# Patient Record
Sex: Female | Born: 1965 | Race: White | Hispanic: No | Marital: Married | Smoking: Never smoker
Health system: Southern US, Community
[De-identification: ages and names within clinical notes are randomized; demographics above are authoritative.]

## PROBLEM LIST (undated history)

## (undated) DIAGNOSIS — I10 Essential (primary) hypertension: Secondary | ICD-10-CM

## (undated) DIAGNOSIS — K760 Fatty (change of) liver, not elsewhere classified: Secondary | ICD-10-CM

## (undated) DIAGNOSIS — K8020 Calculus of gallbladder without cholecystitis without obstruction: Secondary | ICD-10-CM

## (undated) DIAGNOSIS — E7800 Pure hypercholesterolemia, unspecified: Secondary | ICD-10-CM

## (undated) DIAGNOSIS — E669 Obesity, unspecified: Secondary | ICD-10-CM

## (undated) DIAGNOSIS — I639 Cerebral infarction, unspecified: Secondary | ICD-10-CM

## (undated) DIAGNOSIS — N951 Menopausal and female climacteric states: Secondary | ICD-10-CM

## (undated) DIAGNOSIS — R232 Flushing: Secondary | ICD-10-CM

## (undated) DIAGNOSIS — E119 Type 2 diabetes mellitus without complications: Secondary | ICD-10-CM

## (undated) DIAGNOSIS — S42309A Unspecified fracture of shaft of humerus, unspecified arm, initial encounter for closed fracture: Secondary | ICD-10-CM

## (undated) DIAGNOSIS — M549 Dorsalgia, unspecified: Secondary | ICD-10-CM

## (undated) HISTORY — DX: Pure hypercholesterolemia, unspecified: E78.00

## (undated) HISTORY — DX: Fatty (change of) liver, not elsewhere classified: K76.0

## (undated) HISTORY — DX: Type 2 diabetes mellitus without complications: E11.9

## (undated) HISTORY — DX: Obesity, unspecified: E66.9

## (undated) HISTORY — DX: Flushing: R23.2

## (undated) HISTORY — DX: Cerebral infarction, unspecified: I63.9

## (undated) HISTORY — DX: Calculus of gallbladder without cholecystitis without obstruction: K80.20

## (undated) HISTORY — PX: TONSILLECTOMY AND ADENOIDECTOMY: SHX28

## (undated) HISTORY — DX: Menopausal and female climacteric states: N95.1

## (undated) HISTORY — PX: TUBAL LIGATION: SHX77

---

## 2004-07-14 ENCOUNTER — Other Ambulatory Visit: Admission: RE | Admit: 2004-07-14 | Discharge: 2004-07-14 | Payer: Self-pay | Admitting: Obstetrics and Gynecology

## 2007-07-24 ENCOUNTER — Other Ambulatory Visit: Admission: RE | Admit: 2007-07-24 | Discharge: 2007-07-24 | Payer: Self-pay | Admitting: Obstetrics and Gynecology

## 2007-12-13 ENCOUNTER — Emergency Department (HOSPITAL_COMMUNITY): Admission: EM | Admit: 2007-12-13 | Discharge: 2007-12-13 | Payer: Self-pay | Admitting: Emergency Medicine

## 2007-12-14 ENCOUNTER — Ambulatory Visit (HOSPITAL_COMMUNITY): Admission: RE | Admit: 2007-12-14 | Discharge: 2007-12-14 | Payer: Self-pay | Admitting: Emergency Medicine

## 2007-12-15 ENCOUNTER — Encounter (HOSPITAL_COMMUNITY): Admission: RE | Admit: 2007-12-15 | Discharge: 2008-01-14 | Payer: Self-pay | Admitting: Emergency Medicine

## 2010-08-17 ENCOUNTER — Other Ambulatory Visit: Payer: Self-pay | Admitting: Obstetrics & Gynecology

## 2010-08-17 ENCOUNTER — Other Ambulatory Visit (HOSPITAL_COMMUNITY)
Admission: RE | Admit: 2010-08-17 | Discharge: 2010-08-17 | Disposition: A | Discharge status: Home / Self Care | Payer: BC Managed Care – PPO | Source: Ambulatory Visit | Attending: Obstetrics and Gynecology | Admitting: Obstetrics and Gynecology

## 2010-08-17 DIAGNOSIS — Z01419 Encounter for gynecological examination (general) (routine) without abnormal findings: Secondary | ICD-10-CM | POA: Insufficient documentation

## 2010-08-17 DIAGNOSIS — Z139 Encounter for screening, unspecified: Secondary | ICD-10-CM

## 2010-08-21 ENCOUNTER — Ambulatory Visit (HOSPITAL_COMMUNITY)
Admission: RE | Admit: 2010-08-21 | Discharge: 2010-08-21 | Disposition: A | Discharge status: Home / Self Care | Payer: BC Managed Care – PPO | Source: Ambulatory Visit | Attending: Obstetrics & Gynecology | Admitting: Obstetrics & Gynecology

## 2010-08-21 DIAGNOSIS — Z139 Encounter for screening, unspecified: Secondary | ICD-10-CM

## 2010-08-21 DIAGNOSIS — Z1231 Encounter for screening mammogram for malignant neoplasm of breast: Secondary | ICD-10-CM | POA: Insufficient documentation

## 2010-08-25 ENCOUNTER — Other Ambulatory Visit: Payer: Self-pay | Admitting: Obstetrics & Gynecology

## 2010-08-25 DIAGNOSIS — R928 Other abnormal and inconclusive findings on diagnostic imaging of breast: Secondary | ICD-10-CM

## 2010-09-02 ENCOUNTER — Ambulatory Visit (HOSPITAL_COMMUNITY)
Admission: RE | Admit: 2010-09-02 | Discharge: 2010-09-02 | Disposition: A | Discharge status: Home / Self Care | Payer: BC Managed Care – PPO | Source: Ambulatory Visit | Attending: Obstetrics & Gynecology | Admitting: Obstetrics & Gynecology

## 2010-09-02 ENCOUNTER — Other Ambulatory Visit: Payer: Self-pay | Admitting: Obstetrics & Gynecology

## 2010-09-02 DIAGNOSIS — N63 Unspecified lump in unspecified breast: Secondary | ICD-10-CM | POA: Insufficient documentation

## 2010-09-02 DIAGNOSIS — R928 Other abnormal and inconclusive findings on diagnostic imaging of breast: Secondary | ICD-10-CM

## 2011-02-26 LAB — COMPREHENSIVE METABOLIC PANEL
AST: 22
Albumin: 3.8
BUN: 7
Calcium: 9.9
Chloride: 104
Creatinine, Ser: 0.81
GFR calc Af Amer: >60
Total Bilirubin: 0.6
Total Protein: 6.9

## 2011-02-26 LAB — URINALYSIS, ROUTINE W REFLEX MICROSCOPIC
Hgb urine dipstick: NEGATIVE
Ketones, ur: NEGATIVE
Protein, ur: NEGATIVE
Urobilinogen, UA: 0.2

## 2011-02-26 LAB — CBC
HCT: 38
MCV: 89.1
Platelets: 284
RDW: 13.2
WBC: 11.9 — ABNORMAL HIGH

## 2011-02-26 LAB — DIFFERENTIAL
Basophils Absolute: 0
Eosinophils Relative: 1
Lymphocytes Relative: 13
Lymphs Abs: 1.6
Monocytes Absolute: 0.6
Monocytes Relative: 5
Neutro Abs: 9.6 — ABNORMAL HIGH

## 2012-10-20 ENCOUNTER — Emergency Department (HOSPITAL_COMMUNITY)
Admission: EM | Admit: 2012-10-20 | Discharge: 2012-10-20 | Disposition: A | Discharge status: Home / Self Care | Payer: BC Managed Care – PPO | Attending: Emergency Medicine | Admitting: Emergency Medicine

## 2012-10-20 ENCOUNTER — Encounter (HOSPITAL_COMMUNITY): Payer: Self-pay

## 2012-10-20 DIAGNOSIS — Z79899 Other long term (current) drug therapy: Secondary | ICD-10-CM | POA: Insufficient documentation

## 2012-10-20 DIAGNOSIS — M5430 Sciatica, unspecified side: Secondary | ICD-10-CM | POA: Insufficient documentation

## 2012-10-20 DIAGNOSIS — IMO0001 Reserved for inherently not codable concepts without codable children: Secondary | ICD-10-CM | POA: Insufficient documentation

## 2012-10-20 DIAGNOSIS — M5432 Sciatica, left side: Secondary | ICD-10-CM

## 2012-10-20 DIAGNOSIS — I10 Essential (primary) hypertension: Secondary | ICD-10-CM | POA: Insufficient documentation

## 2012-10-20 HISTORY — DX: Dorsalgia, unspecified: M54.9

## 2012-10-20 HISTORY — DX: Essential (primary) hypertension: I10

## 2012-10-20 MED ORDER — METHYLPREDNISOLONE SODIUM SUCC 125 MG IJ SOLR
125.0000 mg | Freq: Once | INTRAMUSCULAR | Status: AC
  Administered 2012-10-20: 125 mg via INTRAMUSCULAR
  Filled 2012-10-20: qty 2

## 2012-10-20 MED ORDER — OXYCODONE-ACETAMINOPHEN 5-325 MG PO TABS: 1.0000 | ORAL_TABLET | ORAL | Status: DC | PRN

## 2012-10-20 MED ORDER — OXYCODONE-ACETAMINOPHEN 5-325 MG PO TABS
1.0000 | ORAL_TABLET | Freq: Once | ORAL | Status: AC
  Administered 2012-10-20: 1 via ORAL
  Filled 2012-10-20: qty 1

## 2012-10-20 MED ORDER — ONDANSETRON 4 MG PO TBDP: 4.0000 mg | ORAL_TABLET | Freq: Three times a day (TID) | ORAL | Status: DC | PRN

## 2012-10-20 MED ORDER — ONDANSETRON 4 MG PO TBDP: 4.0000 mg | ORAL_TABLET | Freq: Once | ORAL | Status: DC

## 2012-10-20 MED ORDER — CYCLOBENZAPRINE HCL 10 MG PO TABS
10.0000 mg | ORAL_TABLET | Freq: Once | ORAL | Status: AC
  Administered 2012-10-20: 10 mg via ORAL
  Filled 2012-10-20: qty 1

## 2012-10-20 MED ORDER — CYCLOBENZAPRINE HCL 10 MG PO TABS: 10.0000 mg | ORAL_TABLET | Freq: Two times a day (BID) | ORAL | Status: DC | PRN

## 2012-10-20 NOTE — ED Provider Notes (Signed)
History     CSN: [id number]  Arrival date & time 10/20/12  0137   First MD Initiated Contact with Patient 10/20/12 0205      Chief Complaint  Patient presents with  . Back Pain    (Consider location/radiation/quality/duration/timing/severity/associated sxs/prior treatment) Patient is a 47 y.o. female presenting with back pain.  Back Pain Associated symptoms: no abdominal pain, no chest pain, no fever, no headaches and no numbness    HPI Comments: Shelley Frazier is a 47 y.o. female who presents to the Emergency Department complaining of left buttock and back pain from her sciatic nerve. She is not aware of how she hurt it but it is similr to pain she has had in the past when the nerve was injured. Pain shoots up to her lower back and down to her heel. It hurts when she walks. Denies fever, chills, weakness.  PCP Dr. Margo Aye  Past Medical History  Diagnosis Date  . Hypertension   . Back pain     Past Surgical History  Procedure Laterality Date  . Tubal ligation      No family history on file.  History  Substance Use Topics  . Smoking status: Never Smoker   . Smokeless tobacco: Not on file  . Alcohol Use: No    OB History   Grav Para Term Preterm Abortions TAB SAB Ect Mult Living                  Review of Systems  Constitutional: Negative for fever.       10 Systems reviewed and are negative for acute change except as noted in the HPI.  HENT: Negative for congestion.   Eyes: Negative for discharge and redness.  Respiratory: Negative for cough and shortness of breath.   Cardiovascular: Negative for chest pain.  Gastrointestinal: Negative for vomiting and abdominal pain.  Musculoskeletal: Negative for back pain.       Left lower back pain  Skin: Negative for rash.  Neurological: Negative for syncope, numbness and headaches.  Psychiatric/Behavioral:       No behavior change.    Allergies  Vicodin  Home Medications   Current Outpatient Rx  Name  Route   Sig  Dispense  Refill  . hydrochlorothiazide (HYDRODIURIL) 25 MG tablet   Oral   Take 25 mg by mouth daily.         Marland Kitchen ibuprofen (ADVIL,MOTRIN) 200 MG tablet   Oral   Take 200 mg by mouth every 6 (six) hours as needed for pain.         . potassium chloride (KLOR-CON) 20 MEQ packet   Oral   Take 20 mEq by mouth 2 (two) times daily.           BP 140/66  Pulse 88  Temp(Src) 98.2 F (36.8 C) (Oral)  Resp 20  Ht 5\' 3"  (1.6 m)  Wt 206 lb (93.441 kg)  BMI 36.5 kg/m2  SpO2 100%  Physical Exam  Nursing note and vitals reviewed. Constitutional: She appears well-developed and well-nourished.  Awake, alert, nontoxic appearance.  HENT:  Head: Normocephalic and atraumatic.  Eyes: EOM are normal. Pupils are equal, round, and reactive to light.  Neck: Normal range of motion. Neck supple.  Cardiovascular: Normal rate and intact distal pulses.   Pulmonary/Chest: Effort normal and breath sounds normal. She exhibits no tenderness.  Abdominal: Soft. Bowel sounds are normal. There is no tenderness. There is no rebound.  Musculoskeletal: She exhibits no tenderness.  Baseline ROM, no obvious new focal weakness.straight leg positive on left. No spasms to the back.  Neurological:  Mental status and motor strength appears baseline for patient and situation.  Skin: No rash noted.  Psychiatric: She has a normal mood and affect.    ED Course  Procedures (including critical care time)     MDM  Patient presents with sciatica on the left. Given solumedrol, oxycodone, flexeril and zofran. Pt stable in ED with no significant deterioration in condition.The patient appears reasonably screened and/or stabilized for discharge and I doubt any other medical condition or other [facility] requiring further screening, evaluation, or treatment in the ED at this time prior to discharge.  MDM Reviewed: nursing note and vitals           Nicoletta Dress. Colon Branch, MD 10/20/12 [phone]

## 2013-07-18 ENCOUNTER — Other Ambulatory Visit: Payer: Self-pay | Admitting: Adult Health

## 2013-07-30 ENCOUNTER — Other Ambulatory Visit: Payer: Self-pay | Admitting: Adult Health

## 2013-07-30 ENCOUNTER — Ambulatory Visit (INDEPENDENT_AMBULATORY_CARE_PROVIDER_SITE_OTHER): Payer: BC Managed Care – PPO | Admitting: Adult Health

## 2013-07-30 ENCOUNTER — Encounter: Payer: Self-pay | Admitting: Adult Health

## 2013-07-30 ENCOUNTER — Encounter (INDEPENDENT_AMBULATORY_CARE_PROVIDER_SITE_OTHER): Payer: Self-pay

## 2013-07-30 ENCOUNTER — Other Ambulatory Visit (HOSPITAL_COMMUNITY)
Admission: RE | Admit: 2013-07-30 | Discharge: 2013-07-30 | Disposition: A | Discharge status: Home / Self Care | Payer: BC Managed Care – PPO | Source: Ambulatory Visit | Attending: Adult Health | Admitting: Adult Health

## 2013-07-30 VITALS — BP 166/98 | HR 78 | Ht 62.0 in | Wt 198.5 lb

## 2013-07-30 DIAGNOSIS — Z01419 Encounter for gynecological examination (general) (routine) without abnormal findings: Secondary | ICD-10-CM

## 2013-07-30 DIAGNOSIS — Z1212 Encounter for screening for malignant neoplasm of rectum: Secondary | ICD-10-CM

## 2013-07-30 DIAGNOSIS — I10 Essential (primary) hypertension: Secondary | ICD-10-CM

## 2013-07-30 DIAGNOSIS — Z1151 Encounter for screening for human papillomavirus (HPV): Secondary | ICD-10-CM | POA: Insufficient documentation

## 2013-07-30 DIAGNOSIS — N951 Menopausal and female climacteric states: Secondary | ICD-10-CM

## 2013-07-30 DIAGNOSIS — R232 Flushing: Secondary | ICD-10-CM

## 2013-07-30 HISTORY — DX: Menopausal and female climacteric states: N95.1

## 2013-07-30 HISTORY — DX: Flushing: R23.2

## 2013-07-30 LAB — HEMOCCULT GUIAC POC 1CARD (OFFICE): FECAL OCCULT BLD: NEGATIVE

## 2013-07-30 MED ORDER — LOSARTAN POTASSIUM 100 MG PO TABS: 100.0000 mg | ORAL_TABLET | Freq: Every day | ORAL | Status: DC

## 2013-07-30 MED ORDER — AMLODIPINE BESYLATE 5 MG PO TABS: 5.0000 mg | ORAL_TABLET | Freq: Every day | ORAL | Status: DC

## 2013-07-30 NOTE — Progress Notes (Signed)
Patient ID: Shelley Frazier, female   DOB: 20-Aug-1965, 48 y.o.   MRN: [id number] History of Present Illness: Shelley Frazier is a 48 year old white female, married in for a pap and physical.  Current Medications, Allergies, Past Medical History, Past Surgical History, Family History and Social History were reviewed in [employer] record.     Review of Systems: Patient denies any headaches, blurred vision, shortness of breath, chest pain, abdominal pain, problems with bowel movements, urination, or intercourse. No joint pain or mood swings.She does complain of hor flashes and LMP 03/2013.    Physical Exam:BP 166/98  Pulse 78  Ht 5\' 2"  (1.575 m)  Wt 198 lb 8 oz (90.039 kg)  BMI 36.30 kg/m2  LMP 04/18/2013 General:  Well developed, well nourished, no acute distress Skin:  Warm and dry Neck:  Midline trachea, normal thyroid Lungs; Clear to auscultation bilaterally Breast:  No dominant palpable mass, retraction, or nipple discharge Cardiovascular: Regular rate and rhythm Abdomen:  Soft, non tender, no hepatosplenomegaly Pelvic:  External genitalia is normal in appearance, has some vitiligo.  The vagina is normal in appearance.  The cervix is bulbous.Pap with HPV performed.  Uterus is felt to be normal size, shape, and contour.  No  adnexal masses or tenderness noted. Rectal: Good sphincter tone, no polyps, or hemorrhoids felt.  Hemoccult negative. Extremities:  No swelling or varicosities noted Psych:  No mood changes, alert and cooperative,seems happy Discussed menopause, will talk after labs back.  Impression: Yearly gyn exam Hot flashes Hypertension Perimenopausal     Plan: Check CBC,CMP,TSH and lipids and FSH Physical in 1 year Mammogram now and yearly Colonoscopy at 21 Will talk this week about labs Refilled norvasc 5 mg #30 1 daily x 1 year Refilled cozaar 100 mg #30 1 daily x 1 year Review handouts on menopause and hypertension

## 2013-07-30 NOTE — Patient Instructions (Signed)
Menopause Menopause is the normal time of life when menstrual periods stop completely. Menopause is complete when you have missed 12 consecutive menstrual periods. It usually occurs between the ages of 8 years and 48 years. Very rarely does a woman develop menopause before the age of 60 years. At menopause, your ovaries stop producing the female hormones estrogen and progesterone. This can cause undesirable symptoms and also affect your health. Sometimes the symptoms may occur 4 5 years before the menopause begins. There is no relationship between menopause and:  Oral contraceptives.  Number of children you had.  Race.  The age your menstrual periods started (menarche). Heavy smokers and very thin women may develop menopause earlier in life. CAUSES  The ovaries stop producing the female hormones estrogen and progesterone.  Other causes include:  Surgery to remove both ovaries.  The ovaries stop functioning for no known reason.  Tumors of the pituitary gland in the brain.  Medical disease that affects the ovaries and hormone production.  Radiation treatment to the abdomen or pelvis.  Chemotherapy that affects the ovaries. SYMPTOMS   Hot flashes.  Night sweats.  Decrease in sex drive.  Vaginal dryness and thinning of the vagina causing painful intercourse.  Dryness of the skin and developing wrinkles.  Headaches.  Tiredness.  Irritability.  Memory problems.  Weight gain.  Bladder infections.  Hair growth of the face and chest.  Infertility. More serious symptoms include:  Loss of bone (osteoporosis) causing breaks (fractures).  Depression.  Hardening and narrowing of the arteries (atherosclerosis) causing heart attacks and strokes. DIAGNOSIS   When the menstrual periods have stopped for 12 straight months.  Physical exam.  Hormone studies of the blood. TREATMENT  There are many treatment choices and nearly as many questions about them. The  decisions to treat or not to treat menopausal changes is an individual choice made with your health care provider. Your health care provider can discuss the treatments with you. Together, you can decide which treatment will work best for you. Your treatment choices may include:   Hormone therapy (estrogen and progesterone).  Non-hormonal medicines.  Treating the individual symptoms with medicine (for example antidepressants for depression).  Herbal medicines that may help specific symptoms.  Counseling by a psychiatrist or psychologist.  Group therapy.  Lifestyle changes including:  Eating healthy.  Regular exercise.  Limiting caffeine and alcohol.  Stress management and meditation.  No treatment. HOME CARE INSTRUCTIONS   Take the medicine your health care provider gives you as directed.  Get plenty of sleep and rest.  Exercise regularly.  Eat a diet that contains calcium (good for the bones) and soy products (acts like estrogen hormone).  Avoid alcoholic beverages.  Do not smoke.  If you have hot flashes, dress in layers.  Take supplements, calcium, and vitamin D to strengthen bones.  You can use over-the-counter lubricants or moisturizers for vaginal dryness.  Group therapy is sometimes very helpful.  Acupuncture may be helpful in some cases. SEEK MEDICAL CARE IF:   You are not sure you are in menopause.  You are having menopausal symptoms and need advice and treatment.  You are still having menstrual periods after age 71 years.  You have pain with intercourse.  Menopause is complete (no menstrual period for 12 months) and you develop vaginal bleeding.  You need a referral to a specialist (gynecologist, psychiatrist, or psychologist) for treatment. SEEK IMMEDIATE MEDICAL CARE IF:   You have severe depression.  You have excessive vaginal bleeding.  You fell and think you have a broken bone.  You have pain when you urinate.  You develop leg or  chest pain.  You have a fast pounding heart beat (palpitations).  You have severe headaches.  You develop vision problems.  You feel a lump in your breast.  You have abdominal pain or severe indigestion. Document Released: 08/07/2003 Document Revised: 01/17/2013 Document Reviewed: 12/14/2012 [facility] Patient Information 2014 [place], [place]. Hypertension As your heart beats, it forces blood through your arteries. This force is your blood pressure. If the pressure is too high, it is called hypertension (HTN) or high blood pressure. HTN is dangerous because you may have it and not know it. High blood pressure may mean that your heart has to work harder to pump blood. Your arteries may be narrow or stiff. The extra work puts you at risk for heart disease, stroke, and other problems.  Blood pressure consists of two numbers, a higher number over a lower, 110/72, for example. It is stated as "110 over 72." The ideal is below 120 for the top number (systolic) and under 80 for the bottom (diastolic). Write down your blood pressure today. You should pay close attention to your blood pressure if you have certain conditions such as:  Heart failure.  Prior heart attack.  Diabetes  Chronic kidney disease.  Prior stroke.  Multiple risk factors for heart disease. To see if you have HTN, your blood pressure should be measured while you are seated with your arm held at the level of the heart. It should be measured at least twice. A one-time elevated blood pressure reading (especially in the Emergency Department) does not mean that you need treatment. There may be conditions in which the blood pressure is different between your right and left arms. It is important to see your caregiver soon for a recheck. Most people have essential hypertension which means that there is not a specific cause. This type of high blood pressure may be lowered by changing lifestyle factors such  as:  Stress.  Smoking.  Lack of exercise.  Excessive weight.  Drug/tobacco/alcohol use.  Eating less salt. Most people do not have symptoms from high blood pressure until it has caused damage to the body. Effective treatment can often prevent, delay or reduce that damage. TREATMENT  When a cause has been identified, treatment for high blood pressure is directed at the cause. There are a large number of medications to treat HTN. These fall into several categories, and your caregiver will help you select the medicines that are best for you. Medications may have side effects. You should review side effects with your caregiver. If your blood pressure stays high after you have made lifestyle changes or started on medicines,   Your medication(s) may need to be changed.  Other problems may need to be addressed.  Be certain you understand your prescriptions, and know how and when to take your medicine.  Be sure to follow up with your caregiver within the time frame advised (usually within two weeks) to have your blood pressure rechecked and to review your medications.  If you are taking more than one medicine to lower your blood pressure, make sure you know how and at what times they should be taken. Taking two medicines at the same time can result in blood pressure that is too low. SEEK IMMEDIATE MEDICAL CARE IF:  You develop a severe headache, blurred or changing vision, or confusion.  You have unusual weakness or numbness, or  a faint feeling.  You have severe chest or abdominal pain, vomiting, or breathing problems. MAKE SURE YOU:   Understand these instructions.  Will watch your condition.  Will get help right away if you are not doing well or get worse. Document Released: 05/17/2005 Document Revised: 08/09/2011 Document Reviewed: 01/05/2008 [facility] Patient Information 2014 [place]. Get mammogram  Physical in 1 year Colonoscopy at 36  Will call with labs

## 2013-07-31 LAB — COMPREHENSIVE METABOLIC PANEL
ALBUMIN: 4.1 g/dL (ref 3.5–5.2)
ALK PHOS: 111 U/L (ref 39–117)
ALT: 46 U/L — ABNORMAL HIGH (ref 0–35)
AST: 22 U/L (ref 0–37)
BUN: 8 mg/dL (ref 6–23)
CO2: 29 mEq/L (ref 19–32)
Calcium: 9.9 mg/dL (ref 8.4–10.5)
Chloride: 98 mEq/L (ref 96–112)
Creat: 0.69 mg/dL (ref 0.50–1.10)
GLUCOSE: 305 mg/dL — AB (ref 70–99)
POTASSIUM: 4.1 meq/L (ref 3.5–5.3)
Sodium: 137 mEq/L (ref 135–145)
Total Bilirubin: 0.6 mg/dL (ref 0.2–1.2)
Total Protein: 7.2 g/dL (ref 6.0–8.3)

## 2013-07-31 LAB — CBC
HEMATOCRIT: 44.8 % (ref 36.0–46.0)
HEMOGLOBIN: 15.2 g/dL — AB (ref 12.0–15.0)
MCH: 29.5 pg (ref 26.0–34.0)
MCHC: 33.9 g/dL (ref 30.0–36.0)
MCV: 87 fL (ref 78.0–100.0)
Platelets: 267 10*3/uL (ref 150–400)
RBC: 5.15 MIL/uL — ABNORMAL HIGH (ref 3.87–5.11)
RDW: 13.9 % (ref 11.5–15.5)
WBC: 8.2 10*3/uL (ref 4.0–10.5)

## 2013-07-31 LAB — LIPID PANEL
CHOLESTEROL: 275 mg/dL — AB (ref 0–200)
HDL: 39 mg/dL — ABNORMAL LOW (ref >39)
LDL Cholesterol: 191 mg/dL — ABNORMAL HIGH (ref 0–99)
TRIGLYCERIDES: 224 mg/dL — AB (ref <150)
Total CHOL/HDL Ratio: 7.1 Ratio
VLDL: 45 mg/dL — AB (ref 0–40)

## 2013-07-31 LAB — FOLLICLE STIMULATING HORMONE: FSH: 74 m[IU]/mL

## 2013-07-31 LAB — HEMOGLOBIN A1C
Hgb A1c MFr Bld: 12.8 % — ABNORMAL HIGH (ref <5.7)
MEAN PLASMA GLUCOSE: 321 mg/dL — AB (ref <117)

## 2013-07-31 LAB — TSH: TSH: 0.665 u[IU]/mL (ref 0.350–4.500)

## 2013-08-02 ENCOUNTER — Telehealth: Payer: Self-pay | Admitting: Adult Health

## 2013-08-02 NOTE — Telephone Encounter (Signed)
Pt aware of labs and that she has diabetes and hyperdyslipidemea and needs meds and counseling will call Dr Janine Ores he may want to handle all this, also pap -HPV, has + yeast, no syn mptoms declines meds for that at present.

## 2014-04-01 ENCOUNTER — Encounter: Payer: Self-pay | Admitting: Adult Health

## 2015-10-08 ENCOUNTER — Other Ambulatory Visit (HOSPITAL_COMMUNITY): Payer: Self-pay | Admitting: Internal Medicine

## 2015-10-08 DIAGNOSIS — R945 Abnormal results of liver function studies: Secondary | ICD-10-CM

## 2015-10-30 ENCOUNTER — Other Ambulatory Visit (HOSPITAL_COMMUNITY): Payer: Self-pay | Admitting: Internal Medicine

## 2015-10-30 DIAGNOSIS — Z1231 Encounter for screening mammogram for malignant neoplasm of breast: Secondary | ICD-10-CM

## 2015-11-04 ENCOUNTER — Ambulatory Visit (HOSPITAL_COMMUNITY)
Admission: RE | Admit: 2015-11-04 | Discharge: 2015-11-04 | Disposition: A | Discharge status: Home / Self Care | Payer: BC Managed Care – PPO | Source: Ambulatory Visit | Attending: Internal Medicine | Admitting: Internal Medicine

## 2015-11-04 DIAGNOSIS — K8020 Calculus of gallbladder without cholecystitis without obstruction: Secondary | ICD-10-CM | POA: Insufficient documentation

## 2015-11-04 DIAGNOSIS — K760 Fatty (change of) liver, not elsewhere classified: Secondary | ICD-10-CM | POA: Diagnosis not present

## 2015-11-04 DIAGNOSIS — R945 Abnormal results of liver function studies: Secondary | ICD-10-CM

## 2015-11-04 DIAGNOSIS — K7689 Other specified diseases of liver: Secondary | ICD-10-CM | POA: Insufficient documentation

## 2015-11-17 ENCOUNTER — Ambulatory Visit (HOSPITAL_COMMUNITY)
Admission: RE | Admit: 2015-11-17 | Discharge: 2015-11-17 | Disposition: A | Discharge status: Home / Self Care | Payer: BC Managed Care – PPO | Source: Ambulatory Visit | Attending: Internal Medicine | Admitting: Internal Medicine

## 2015-11-17 DIAGNOSIS — Z1231 Encounter for screening mammogram for malignant neoplasm of breast: Secondary | ICD-10-CM | POA: Insufficient documentation

## 2016-05-31 DIAGNOSIS — I639 Cerebral infarction, unspecified: Secondary | ICD-10-CM

## 2016-05-31 HISTORY — DX: Cerebral infarction, unspecified: I63.9

## 2016-07-28 ENCOUNTER — Other Ambulatory Visit (HOSPITAL_COMMUNITY)
Admission: RE | Admit: 2016-07-28 | Discharge: 2016-07-28 | Disposition: A | Discharge status: Home / Self Care | Payer: BC Managed Care – PPO | Source: Ambulatory Visit | Attending: Adult Health | Admitting: Adult Health

## 2016-07-28 ENCOUNTER — Encounter: Payer: Self-pay | Admitting: Adult Health

## 2016-07-28 ENCOUNTER — Other Ambulatory Visit: Payer: Self-pay | Admitting: Obstetrics and Gynecology

## 2016-07-28 ENCOUNTER — Ambulatory Visit (INDEPENDENT_AMBULATORY_CARE_PROVIDER_SITE_OTHER): Payer: BC Managed Care – PPO | Admitting: Adult Health

## 2016-07-28 VITALS — BP 170/90 | HR 75 | Ht 62.0 in | Wt 179.5 lb

## 2016-07-28 DIAGNOSIS — Z1151 Encounter for screening for human papillomavirus (HPV): Secondary | ICD-10-CM | POA: Insufficient documentation

## 2016-07-28 DIAGNOSIS — Z01411 Encounter for gynecological examination (general) (routine) with abnormal findings: Secondary | ICD-10-CM

## 2016-07-28 DIAGNOSIS — Z01419 Encounter for gynecological examination (general) (routine) without abnormal findings: Secondary | ICD-10-CM | POA: Insufficient documentation

## 2016-07-28 DIAGNOSIS — N950 Postmenopausal bleeding: Secondary | ICD-10-CM | POA: Diagnosis not present

## 2016-07-28 DIAGNOSIS — Z1212 Encounter for screening for malignant neoplasm of rectum: Secondary | ICD-10-CM

## 2016-07-28 DIAGNOSIS — O3680X0 Pregnancy with inconclusive fetal viability, not applicable or unspecified: Secondary | ICD-10-CM

## 2016-07-28 DIAGNOSIS — Z1211 Encounter for screening for malignant neoplasm of colon: Secondary | ICD-10-CM

## 2016-07-28 LAB — HEMOCCULT GUIAC POC 1CARD (OFFICE): FECAL OCCULT BLD: NEGATIVE

## 2016-07-28 NOTE — Patient Instructions (Signed)
Physical in 1 year Pap in 3 years if this one normal Mammogram yearly Colonoscopy advised  Labs with PCP Postmenopausal Bleeding Postmenopausal bleeding is any bleeding a woman has after she has entered into menopause. Menopause is the end of a woman's fertile years. After menopause, a woman no longer ovulates or has menstrual periods. Postmenopausal bleeding can be caused by various things. Any type of postmenopausal bleeding, even if it appears to be a typical menstrual period, is concerning. This should be evaluated by your health care provider. Any treatment will depend on the cause of the bleeding. Follow these instructions at home: Monitor your condition for any changes. The following actions may help to alleviate any discomfort you are experiencing:  Avoid the use of tampons and douches as directed by your health care provider.  Change your pads frequently.  Get regular pelvic exams and Pap tests.  Keep all follow-up appointments for diagnostic tests as directed by your health care provider. Contact a health care provider if:  Your bleeding lasts more than 1 week.  You have abdominal pain.  You have bleeding with sexual intercourse. Get help right away if:  You have a fever, chills, headache, dizziness, muscle aches, and bleeding.  You have severe pain with bleeding.  You are passing blood clots.  You have bleeding and need more than 1 pad an hour.  You feel faint. This information is not intended to replace advice given to you by your health care provider. Make sure you discuss any questions you have with your health care provider. Document Released: 08/25/2005 Document Revised: 10/23/2015 Document Reviewed: 12/14/2012 Elsevier Interactive Patient Education  2017 [employer].

## 2016-07-28 NOTE — Progress Notes (Signed)
Patient ID: Shelley Frazier, female   DOB: 1965/09/26, 51 y.o.   MRN: [id number] History of Present Illness:  Shelley Frazier is a 51 year old white female, married, G2P2, PM in for well woman gyn exam and pap.She says she had vaginal bleeding about a month ago and last periods was about 3 years ago. PCP is Dr Maudie Mercury.  Current Medications, Allergies, Past Medical History, Past Surgical History, Family History and Social History were reviewed in [employer] record.     Review of Systems: Patient denies any headaches, hearing loss, fatigue, blurred vision, shortness of breath, chest pain, abdominal pain, problems with bowel movements, urination, or intercourse(hurts at times, using lubricate). No joint pain or mood swings. Had spotting last month, last period about 3 years ago   Physical Exam:BP (!) 170/90 (BP Location: Right Arm, Patient Position: Sitting, Cuff Size: Normal)   Pulse 75   Ht 5\' 2"  (1.575 m)   Wt 179 lb 8 oz (81.4 kg)   LMP 04/18/2013   BMI 32.83 kg/m  General:  Well developed, well nourished, no acute distress Skin:  Warm and dry Neck:  Midline trachea, normal thyroid, good ROM, no lymphadenopathy Lungs; Clear to auscultation bilaterally Breast:  No dominant palpable mass, retraction, or nipple discharge Cardiovascular: Regular rate and rhythm Abdomen:  Soft, non tender, no hepatosplenomegaly Pelvic:  External genitalia is normal in appearance, no lesions.  The vagina is normal in appearance. Urethra has no lesions or masses. The cervix is smooth, pap with HPV performed.Marland Kitchen  Uterus is felt to be normal size, shape, and contour.  No adnexal masses or tenderness noted.Bladder is non tender, no masses felt. Rectal: Good sphincter tone, no polyps, or hemorrhoids felt.  Hemoccult negative. Extremities/musculoskeletal:  No swelling or varicosities noted, no clubbing or cyanosis Psych:  No mood changes, alert and cooperative,seems happy PHQ 2 score 0.Will get [place] to  assess uterus, discussed PMB.  Impression:  1. Encounter for gynecological examination with Papanicolaou smear of cervix   2. Screening for colorectal cancer   3. PMB (postmenopausal bleeding)      Plan: Physical in 1 year Pap in 3 years if this one normal Mammogram yearly Colonoscopy advised  Labs with PCP F/U with Dr Maudie Mercury about BP, she thinks it is white coat is on BP meds

## 2016-07-29 LAB — CYTOLOGY - PAP
Adequacy: ABSENT
Diagnosis: NEGATIVE
HPV: NOT DETECTED

## 2016-08-04 ENCOUNTER — Other Ambulatory Visit: Payer: BC Managed Care – PPO

## 2016-08-09 ENCOUNTER — Other Ambulatory Visit: Payer: BC Managed Care – PPO

## 2016-08-16 ENCOUNTER — Other Ambulatory Visit: Payer: Self-pay | Admitting: Adult Health

## 2016-08-16 ENCOUNTER — Ambulatory Visit (INDEPENDENT_AMBULATORY_CARE_PROVIDER_SITE_OTHER): Payer: BC Managed Care – PPO

## 2016-08-16 DIAGNOSIS — N950 Postmenopausal bleeding: Secondary | ICD-10-CM

## 2016-08-16 DIAGNOSIS — O3680X0 Pregnancy with inconclusive fetal viability, not applicable or unspecified: Secondary | ICD-10-CM

## 2016-08-16 NOTE — Progress Notes (Signed)
PELVIC US TA/TV: heterogeneous anteverted uterus w/mult small fibroids (#1) post intramural fibroid 10 x 7 x 6 mm (#2) ant left intramural fibroid 9 x 8 x 4 mm,thickened endometrium 6.5 mm, 8 x 8 x 6 mm hypoechoic focal mass within the endometrial canal(w/color flow),normal ovaries bilat (limited view),no free fluid

## 2016-08-18 ENCOUNTER — Telehealth: Payer: Self-pay | Admitting: Adult Health

## 2016-08-18 NOTE — Telephone Encounter (Signed)
Pt aware US shows small fibroid, thickened endometrium and ?polyp, needs endo bx and possible, D&C, will make appt with Dr Elonda Husky

## 2016-08-30 ENCOUNTER — Encounter: Payer: Self-pay | Admitting: Obstetrics & Gynecology

## 2016-08-30 ENCOUNTER — Ambulatory Visit (INDEPENDENT_AMBULATORY_CARE_PROVIDER_SITE_OTHER): Payer: BC Managed Care – PPO | Admitting: Obstetrics & Gynecology

## 2016-08-30 VITALS — BP 152/96 | HR 85 | Wt 179.0 lb

## 2016-08-30 DIAGNOSIS — N950 Postmenopausal bleeding: Secondary | ICD-10-CM

## 2016-08-30 DIAGNOSIS — N840 Polyp of corpus uteri: Secondary | ICD-10-CM

## 2016-08-30 NOTE — Progress Notes (Signed)
Preoperative History and Physical  Shelley Frazier is a 51 y.o. [id number] with Patient's last menstrual period was 04/18/2013. admitted for a hysteroscopy uterine curettage with removal of endometrial polyp, mass.  Pt has a significant endoemtrial mass, polyp vs myoma, favor polyp as cause of her PMB.  Endometrial biopsy is not indicated since going forward with formal endoemtrial evaluation  PMH:    Past Medical History:  Diagnosis Date  . Back pain   . Diabetes mellitus without complication ([place])   . Fatty liver   . Gallstones   . Hot flashes 07/30/2013  . Hypertension   . Obesity   . Perimenopausal symptoms 07/30/2013    PSH:     Past Surgical History:  Procedure Laterality Date  . TONSILLECTOMY AND ADENOIDECTOMY    . TUBAL LIGATION      POb/GynH:      OB History    Gravida Para Term Preterm AB Living   2 2 1 1   2    SAB TAB Ectopic Multiple Live Births           2      SH:   Social History  Substance Use Topics  . Smoking status: Never Smoker  . Smokeless tobacco: Never Used  . Alcohol use No    FH:    Family History  Problem Relation Age of Onset  . Hypertension Mother   . Gallbladder disease Mother   . Diabetes Father   . Hypertension Father   . Gallbladder disease Maternal Aunt   . Heart disease Paternal Aunt   . Heart disease Paternal Uncle   . Diabetes Maternal Grandfather   . Heart disease Paternal Grandmother   . Diabetes Paternal Grandmother      Allergies:  Allergies  Allergen Reactions  . Vicodin [Hydrocodone-Acetaminophen] Nausea And Vomiting    Medications:       Current Outpatient Prescriptions:  .  COD LIVER OIL PO, Take by mouth daily., Disp: , Rfl:  .  glimepiride (AMARYL) 2 MG tablet, Take 2 mg by mouth daily with breakfast., Disp: , Rfl:  .  ibuprofen (ADVIL,MOTRIN) 200 MG tablet, Take 200 mg by mouth every 6 (six) hours as needed for pain., Disp: , Rfl:  .  lisinopril (PRINIVIL,ZESTRIL) 2.5 MG tablet, Take 2.5 mg by mouth daily.,  Disp: , Rfl:  .  lovastatin (MEVACOR) 40 MG tablet, Take 40 mg by mouth at bedtime., Disp: , Rfl:  .  metFORMIN (GLUCOPHAGE) 1000 MG tablet, Take 1,000 mg by mouth 2 (two) times daily with a meal., Disp: , Rfl:  .  Multiple Vitamin (MULTIVITAMIN) tablet, Take 1 tablet by mouth daily., Disp: , Rfl:   Review of Systems:   Review of Systems  Constitutional: Negative for fever, chills, weight loss, malaise/fatigue and diaphoresis.  HENT: Negative for hearing loss, ear pain, nosebleeds, congestion, sore throat, neck pain, tinnitus and ear discharge.   Eyes: Negative for blurred vision, double vision, photophobia, pain, discharge and redness.  Respiratory: Negative for cough, hemoptysis, sputum production, shortness of breath, wheezing and stridor.   Cardiovascular: Negative for chest pain, palpitations, orthopnea, claudication, leg swelling and PND.  Gastrointestinal: Positive for abdominal pain. Negative for heartburn, nausea, vomiting, diarrhea, constipation, blood in stool and melena.  Genitourinary: Negative for dysuria, urgency, frequency, hematuria and flank pain.  Musculoskeletal: Negative for myalgias, back pain, joint pain and falls.  Skin: Negative for itching and rash.  Neurological: Negative for dizziness, tingling, tremors, sensory change, speech change, focal weakness, seizures,  loss of consciousness, weakness and headaches.  Endo/Heme/Allergies: Negative for environmental allergies and polydipsia. Does not bruise/bleed easily.  Psychiatric/Behavioral: Negative for depression, suicidal ideas, hallucinations, memory loss and substance abuse. The patient is not nervous/anxious and does not have insomnia.      PHYSICAL EXAM:  Blood pressure (!) 152/96, pulse 85, weight 179 lb (81.2 kg), last menstrual period 04/18/2013.    Vitals reviewed. Constitutional: She is oriented to person, place, and time. She appears well-developed and well-nourished.  HENT:  Head: Normocephalic and  atraumatic.  Right Ear: External ear normal.  Left Ear: External ear normal.  Nose: Nose normal.  Mouth/Throat: Oropharynx is clear and moist.  Eyes: Conjunctivae and EOM are normal. Pupils are equal, round, and reactive to light. Right eye exhibits no discharge. Left eye exhibits no discharge. No scleral icterus.  Neck: Normal range of motion. Neck supple. No tracheal deviation present. No thyromegaly present.  Cardiovascular: Normal rate, regular rhythm, normal heart sounds and intact distal pulses.  Exam reveals no gallop and no friction rub.   No murmur heard. Respiratory: Effort normal and breath sounds normal. No respiratory distress. She has no wheezes. She has no rales. She exhibits no tenderness.  GI: Soft. Bowel sounds are normal. She exhibits no distension and no mass. There is tenderness. There is no rebound and no guarding.  Genitourinary:       Vulva is normal without lesions Vagina is pink moist without discharge Cervix normal in appearance and pap is normal Uterus is normal size, contour, position, consistency, mobility, non-tender Adnexa is negative with normal sized ovaries by sonogram  Musculoskeletal: Normal range of motion. She exhibits no edema and no tenderness.  Neurological: She is alert and oriented to person, place, and time. She has normal reflexes. She displays normal reflexes. No cranial nerve deficit. She exhibits normal muscle tone. Coordination normal.  Skin: Skin is warm and dry. No rash noted. No erythema. No pallor.  Psychiatric: She has a normal mood and affect. Her behavior is normal. Judgment and thought content normal.    Labs: No results found for this or any previous visit (from the past 336 hour(s)).  EKG: No orders found for this or any previous visit.  Imaging Studies: US Transvaginal Non-ob  Result Date: 08/17/2016 GYNECOLOGIC SONOGRAM Shelley Frazier is a 51 y.o. [id number] for a pelvic sonogram for postmenopausal bleeding. Uterus                       7.6 x 4 x 4.6 cm, heterogeneous anteverted uterus w/mult small fibroids Endometrium          6.5 mm, symmetrical, thickened EEC, 8 x 8 x 6 mm hypoechoic focal mass within the endometrial canal(w/color flow) Right ovary             2.2 x 1.3 x 2.2 cm, wnl, limited view Left ovary                2.3 x 1.7 x 2 cm, wnl ,  limited view Fibroids (#1) post intramural fibroid 10 x 7 x 6 mm (#2) ant left intramural fibroid 9 x 8 x 4 mm Technician Comments: PELVIC US TA/TV: heterogeneous anteverted uterus w/mult small fibroids (#1) post intramural fibroid 10 x 7 x 6 mm (#2) ant left intramural fibroid 9 x 8 x 4 mm,thickened endometrium 6.5 mm, 8 x 8 x 6 mm hypoechoic focal mass within the endometrial canal(w/color flow),normal ovaries bilat (limited view),no free fluid [employer]  Heide Guile 08/16/2016 5:15 PM Clinical Impression and recommendations: I have reviewed the sonogram results above, combined with the patient's current clinical course, below are my impressions and any appropriate recommendations for management based on the sonographic findings. Uterus with 2 very small myomas, clinically irrelevant Endometrium is thickened for a post menopausal woman and there is a endometrial mass, consistent with a polyp Both ovaries are normal Recommend endometrial sampling at a minimum but consider a formal hysteroscopy with removal of the endometrial polyp and a uterine curettage Jakeisha Stricker H 08/17/2016 6:50 PM   US Pelvis Complete  Result Date: 08/17/2016 GYNECOLOGIC SONOGRAM Shelley Frazier is a 51 y.o. [id number] for a pelvic sonogram for postmenopausal bleeding. Uterus                      7.6 x 4 x 4.6 cm, heterogeneous anteverted uterus w/mult small fibroids Endometrium          6.5 mm, symmetrical, thickened EEC, 8 x 8 x 6 mm hypoechoic focal mass within the endometrial canal(w/color flow) Right ovary             2.2 x 1.3 x 2.2 cm, wnl, limited view Left ovary                2.3 x 1.7 x 2 cm, wnl ,  limited view  Fibroids (#1) post intramural fibroid 10 x 7 x 6 mm (#2) ant left intramural fibroid 9 x 8 x 4 mm Technician Comments: PELVIC US TA/TV: heterogeneous anteverted uterus w/mult small fibroids (#1) post intramural fibroid 10 x 7 x 6 mm (#2) ant left intramural fibroid 9 x 8 x 4 mm,thickened endometrium 6.5 mm, 8 x 8 x 6 mm hypoechoic focal mass within the endometrial canal(w/color flow),normal ovaries bilat (limited view),no free fluid Amber Heide Guile 08/16/2016 5:15 PM Clinical Impression and recommendations: I have reviewed the sonogram results above, combined with the patient's current clinical course, below are my impressions and any appropriate recommendations for management based on the sonographic findings. Uterus with 2 very small myomas, clinically irrelevant Endometrium is thickened for a post menopausal woman and there is a endometrial mass, consistent with a polyp Both ovaries are normal Recommend endometrial sampling at a minimum but consider a formal hysteroscopy with removal of the endometrial polyp and a uterine curettage Kassandra Meriweather H 08/17/2016 6:50 PM      Assessment: Endometrial polyp Post menopausal bleeding  Patient Active Problem List   Diagnosis Date Noted  . Hot flashes 07/30/2013  . Hypertension 07/30/2013  . Perimenopausal symptoms 07/30/2013    Plan: Hysteroscopy uterine curettage removal of endometrial polyp 09/22/2016  Siris Hoos H 08/30/2016 3:30 PM

## 2016-09-15 ENCOUNTER — Inpatient Hospital Stay (HOSPITAL_COMMUNITY): Admission: RE | Admit: 2016-09-15 | Payer: BC Managed Care – PPO | Source: Ambulatory Visit

## 2016-09-22 ENCOUNTER — Ambulatory Visit: Admit: 2016-09-22 | Payer: BC Managed Care – PPO | Admitting: Obstetrics & Gynecology

## 2016-09-22 SURGERY — DILATATION & CURETTAGE/HYSTEROSCOPY WITH NOVASURE ABLATION
Anesthesia: General

## 2016-09-30 ENCOUNTER — Encounter: Payer: BC Managed Care – PPO | Admitting: Obstetrics & Gynecology

## 2016-10-01 ENCOUNTER — Inpatient Hospital Stay (HOSPITAL_COMMUNITY)
Admission: EM | Admit: 2016-10-01 | Discharge: 2016-10-04 | DRG: 065 | Disposition: A | Discharge status: Rehabilitation Facility | Payer: BC Managed Care – PPO | Attending: Internal Medicine | Admitting: Internal Medicine

## 2016-10-01 ENCOUNTER — Observation Stay (HOSPITAL_COMMUNITY): Payer: BC Managed Care – PPO

## 2016-10-01 ENCOUNTER — Emergency Department (HOSPITAL_COMMUNITY): Payer: BC Managed Care – PPO

## 2016-10-01 ENCOUNTER — Encounter (HOSPITAL_COMMUNITY): Payer: Self-pay | Admitting: *Deleted

## 2016-10-01 DIAGNOSIS — I158 Other secondary hypertension: Secondary | ICD-10-CM | POA: Diagnosis not present

## 2016-10-01 DIAGNOSIS — K760 Fatty (change of) liver, not elsewhere classified: Secondary | ICD-10-CM | POA: Diagnosis present

## 2016-10-01 DIAGNOSIS — I10 Essential (primary) hypertension: Secondary | ICD-10-CM | POA: Diagnosis present

## 2016-10-01 DIAGNOSIS — R4701 Aphasia: Secondary | ICD-10-CM | POA: Diagnosis present

## 2016-10-01 DIAGNOSIS — Z885 Allergy status to narcotic agent status: Secondary | ICD-10-CM

## 2016-10-01 DIAGNOSIS — Z791 Long term (current) use of non-steroidal anti-inflammatories (NSAID): Secondary | ICD-10-CM

## 2016-10-01 DIAGNOSIS — Z7984 Long term (current) use of oral hypoglycemic drugs: Secondary | ICD-10-CM

## 2016-10-01 DIAGNOSIS — E1149 Type 2 diabetes mellitus with other diabetic neurological complication: Secondary | ICD-10-CM | POA: Diagnosis present

## 2016-10-01 DIAGNOSIS — I639 Cerebral infarction, unspecified: Secondary | ICD-10-CM

## 2016-10-01 DIAGNOSIS — I5032 Chronic diastolic (congestive) heart failure: Secondary | ICD-10-CM

## 2016-10-01 DIAGNOSIS — I6350 Cerebral infarction due to unspecified occlusion or stenosis of unspecified cerebral artery: Secondary | ICD-10-CM | POA: Diagnosis present

## 2016-10-01 DIAGNOSIS — G8191 Hemiplegia, unspecified affecting right dominant side: Secondary | ICD-10-CM | POA: Diagnosis present

## 2016-10-01 DIAGNOSIS — Z79899 Other long term (current) drug therapy: Secondary | ICD-10-CM

## 2016-10-01 DIAGNOSIS — I6329 Cerebral infarction due to unspecified occlusion or stenosis of other precerebral arteries: Principal | ICD-10-CM | POA: Diagnosis present

## 2016-10-01 DIAGNOSIS — Z8249 Family history of ischemic heart disease and other diseases of the circulatory system: Secondary | ICD-10-CM

## 2016-10-01 DIAGNOSIS — Z833 Family history of diabetes mellitus: Secondary | ICD-10-CM

## 2016-10-01 DIAGNOSIS — E1165 Type 2 diabetes mellitus with hyperglycemia: Secondary | ICD-10-CM | POA: Diagnosis present

## 2016-10-01 DIAGNOSIS — R471 Dysarthria and anarthria: Secondary | ICD-10-CM | POA: Diagnosis present

## 2016-10-01 DIAGNOSIS — R269 Unspecified abnormalities of gait and mobility: Secondary | ICD-10-CM | POA: Diagnosis present

## 2016-10-01 DIAGNOSIS — R29810 Facial weakness: Secondary | ICD-10-CM | POA: Diagnosis not present

## 2016-10-01 DIAGNOSIS — Z6830 Body mass index (BMI) 30.0-30.9, adult: Secondary | ICD-10-CM

## 2016-10-01 DIAGNOSIS — E669 Obesity, unspecified: Secondary | ICD-10-CM | POA: Diagnosis present

## 2016-10-01 DIAGNOSIS — I651 Occlusion and stenosis of basilar artery: Secondary | ICD-10-CM

## 2016-10-01 DIAGNOSIS — E782 Mixed hyperlipidemia: Secondary | ICD-10-CM | POA: Diagnosis present

## 2016-10-01 DIAGNOSIS — R739 Hyperglycemia, unspecified: Secondary | ICD-10-CM

## 2016-10-01 DIAGNOSIS — I110 Hypertensive heart disease with heart failure: Secondary | ICD-10-CM | POA: Diagnosis present

## 2016-10-01 LAB — URINALYSIS, ROUTINE W REFLEX MICROSCOPIC
BACTERIA UA: NONE SEEN
Bilirubin Urine: NEGATIVE
Glucose, UA: >=500 mg/dL — AB
Hgb urine dipstick: NEGATIVE
KETONES UR: 20 mg/dL — AB
Leukocytes, UA: NEGATIVE
Nitrite: NEGATIVE
PROTEIN: NEGATIVE mg/dL
SQUAMOUS EPITHELIAL / LPF: NONE SEEN
Specific Gravity, Urine: 1.027 (ref 1.005–1.030)
pH: 5 (ref 5.0–8.0)

## 2016-10-01 LAB — CBC
HCT: 41.5 % (ref 36.0–46.0)
HEMATOCRIT: 44.9 % (ref 36.0–46.0)
HEMOGLOBIN: 16.1 g/dL — AB (ref 12.0–15.0)
HEMOGLOBIN: 15 g/dL (ref 12.0–15.0)
MCH: 31.4 pg (ref 26.0–34.0)
MCH: 31.4 pg (ref 26.0–34.0)
MCHC: 35.9 g/dL (ref 30.0–36.0)
MCHC: 36.1 g/dL — AB (ref 30.0–36.0)
MCV: 87.5 fL (ref 78.0–100.0)
MCV: 87 fL (ref 78.0–100.0)
PLATELETS: 267 10*3/uL (ref 150–400)
Platelets: 242 10*3/uL (ref 150–400)
RBC: 5.13 MIL/uL — AB (ref 3.87–5.11)
RBC: 4.77 MIL/uL (ref 3.87–5.11)
RDW: 12.2 % (ref 11.5–15.5)
RDW: 12.3 % (ref 11.5–15.5)
WBC: 8.7 10*3/uL (ref 4.0–10.5)
WBC: 9.1 10*3/uL (ref 4.0–10.5)

## 2016-10-01 LAB — COMPREHENSIVE METABOLIC PANEL
ALBUMIN: 3.8 g/dL (ref 3.5–5.0)
ALK PHOS: 79 U/L (ref 38–126)
ALT: 43 U/L (ref 14–54)
AST: 27 U/L (ref 15–41)
Anion gap: 11 (ref 5–15)
BILIRUBIN TOTAL: 1 mg/dL (ref 0.3–1.2)
BUN: 13 mg/dL (ref 6–20)
CALCIUM: 9.2 mg/dL (ref 8.9–10.3)
CO2: 23 mmol/L (ref 22–32)
Chloride: 98 mmol/L — ABNORMAL LOW (ref 101–111)
Creatinine, Ser: 0.69 mg/dL (ref 0.44–1.00)
GFR calc Af Amer: >60 mL/min (ref >60)
GLUCOSE: 390 mg/dL — AB (ref 65–99)
Potassium: 3.8 mmol/L (ref 3.5–5.1)
Sodium: 132 mmol/L — ABNORMAL LOW (ref 135–145)
TOTAL PROTEIN: 7.3 g/dL (ref 6.5–8.1)

## 2016-10-01 LAB — CREATININE, SERUM
CREATININE: 0.77 mg/dL (ref 0.44–1.00)
GFR calc Af Amer: >60 mL/min (ref >60)

## 2016-10-01 LAB — CBG MONITORING, ED
GLUCOSE-CAPILLARY: 319 mg/dL — AB (ref 65–99)
Glucose-Capillary: 404 mg/dL — ABNORMAL HIGH (ref 65–99)

## 2016-10-01 LAB — DIFFERENTIAL
BASOS ABS: 0 10*3/uL (ref 0.0–0.1)
Basophils Relative: 1 %
EOS PCT: 1 %
Eosinophils Absolute: 0.1 10*3/uL (ref 0.0–0.7)
LYMPHS ABS: 1.7 10*3/uL (ref 0.7–4.0)
LYMPHS PCT: 20 %
MONOS PCT: 5 %
Monocytes Absolute: 0.4 10*3/uL (ref 0.1–1.0)
NEUTROS PCT: 73 %
Neutro Abs: 6.4 10*3/uL (ref 1.7–7.7)

## 2016-10-01 LAB — PROTIME-INR
INR: 0.93
Prothrombin Time: 12.5 seconds (ref 11.4–15.2)

## 2016-10-01 LAB — RAPID URINE DRUG SCREEN, HOSP PERFORMED
Amphetamines: NOT DETECTED
BARBITURATES: NOT DETECTED
Benzodiazepines: NOT DETECTED
Cocaine: NOT DETECTED
Opiates: NOT DETECTED
TETRAHYDROCANNABINOL: NOT DETECTED

## 2016-10-01 LAB — GLUCOSE, CAPILLARY
Glucose-Capillary: 333 mg/dL — ABNORMAL HIGH (ref 65–99)
Glucose-Capillary: 390 mg/dL — ABNORMAL HIGH (ref 65–99)

## 2016-10-01 LAB — APTT: aPTT: 23 seconds — ABNORMAL LOW (ref 24–36)

## 2016-10-01 LAB — I-STAT TROPONIN, ED: TROPONIN I, POC: 0 ng/mL (ref 0.00–0.08)

## 2016-10-01 MED ORDER — INSULIN ASPART 100 UNIT/ML ~~LOC~~ SOLN
3.0000 [IU] | Freq: Three times a day (TID) | SUBCUTANEOUS | Status: DC
  Administered 2016-10-02 – 2016-10-03 (×4): 3 [IU] via SUBCUTANEOUS

## 2016-10-01 MED ORDER — PRAVASTATIN SODIUM 40 MG PO TABS
40.0000 mg | ORAL_TABLET | Freq: Every day | ORAL | Status: DC
  Administered 2016-10-01 – 2016-10-02 (×2): 40 mg via ORAL
  Filled 2016-10-01 (×2): qty 1

## 2016-10-01 MED ORDER — ASPIRIN 325 MG PO TABS
325.0000 mg | ORAL_TABLET | Freq: Every day | ORAL | Status: DC
  Administered 2016-10-01 – 2016-10-04 (×4): 325 mg via ORAL
  Filled 2016-10-01 (×4): qty 1

## 2016-10-01 MED ORDER — INSULIN ASPART 100 UNIT/ML ~~LOC~~ SOLN
0.0000 [IU] | Freq: Every day | SUBCUTANEOUS | Status: DC
  Administered 2016-10-01: 4 [IU] via SUBCUTANEOUS
  Administered 2016-10-02: 2 [IU] via SUBCUTANEOUS
  Administered 2016-10-03: 3 [IU] via SUBCUTANEOUS

## 2016-10-01 MED ORDER — ACETAMINOPHEN 325 MG PO TABS
650.0000 mg | ORAL_TABLET | ORAL | Status: DC | PRN
  Administered 2016-10-02 – 2016-10-03 (×2): 650 mg via ORAL
  Filled 2016-10-01 (×2): qty 2

## 2016-10-01 MED ORDER — ACETAMINOPHEN 160 MG/5ML PO SOLN: 650.0000 mg | ORAL | Status: DC | PRN

## 2016-10-01 MED ORDER — INSULIN ASPART 100 UNIT/ML ~~LOC~~ SOLN
0.0000 [IU] | Freq: Three times a day (TID) | SUBCUTANEOUS | Status: DC
  Administered 2016-10-02: 7 [IU] via SUBCUTANEOUS
  Administered 2016-10-02 (×2): 9 [IU] via SUBCUTANEOUS
  Administered 2016-10-03: 5 [IU] via SUBCUTANEOUS
  Administered 2016-10-03 (×2): 7 [IU] via SUBCUTANEOUS
  Administered 2016-10-04: 5 [IU] via SUBCUTANEOUS
  Administered 2016-10-04: 9 [IU] via SUBCUTANEOUS

## 2016-10-01 MED ORDER — ACETAMINOPHEN 650 MG RE SUPP: 650.0000 mg | RECTAL | Status: DC | PRN

## 2016-10-01 MED ORDER — INSULIN ASPART 100 UNIT/ML ~~LOC~~ SOLN
8.0000 [IU] | Freq: Once | SUBCUTANEOUS | Status: AC
  Administered 2016-10-01: 8 [IU] via SUBCUTANEOUS

## 2016-10-01 MED ORDER — GLIMEPIRIDE 2 MG PO TABS
2.0000 mg | ORAL_TABLET | Freq: Every day | ORAL | Status: DC
  Administered 2016-10-02: 2 mg via ORAL
  Filled 2016-10-01: qty 1

## 2016-10-01 MED ORDER — STROKE: EARLY STAGES OF RECOVERY BOOK
Freq: Once | Status: AC
  Administered 2016-10-02: 15:00:00
  Filled 2016-10-01 (×2): qty 1

## 2016-10-01 MED ORDER — ENOXAPARIN SODIUM 40 MG/0.4ML ~~LOC~~ SOLN
40.0000 mg | SUBCUTANEOUS | Status: DC
  Administered 2016-10-01 – 2016-10-03 (×3): 40 mg via SUBCUTANEOUS
  Filled 2016-10-01 (×3): qty 0.4

## 2016-10-01 MED ORDER — SENNOSIDES-DOCUSATE SODIUM 8.6-50 MG PO TABS: 1.0000 | ORAL_TABLET | Freq: Every evening | ORAL | Status: DC | PRN

## 2016-10-01 MED ORDER — ASPIRIN 81 MG PO CHEW
324.0000 mg | CHEWABLE_TABLET | Freq: Once | ORAL | Status: AC
  Administered 2016-10-01: 324 mg via ORAL
  Filled 2016-10-01: qty 4

## 2016-10-01 MED ORDER — ADULT MULTIVITAMIN W/MINERALS CH
1.0000 | ORAL_TABLET | Freq: Every day | ORAL | Status: DC
Start: 2016-10-01 — End: 2016-10-04
  Administered 2016-10-01 – 2016-10-04 (×4): 1 via ORAL
  Filled 2016-10-01 (×4): qty 1

## 2016-10-01 MED ORDER — SODIUM CHLORIDE 0.9 % IV SOLN
INTRAVENOUS | Status: DC
  Administered 2016-10-01 – 2016-10-02 (×2): via INTRAVENOUS

## 2016-10-01 MED ORDER — LISINOPRIL 5 MG PO TABS
2.5000 mg | ORAL_TABLET | Freq: Every day | ORAL | Status: DC
  Administered 2016-10-01: 2.5 mg via ORAL
  Filled 2016-10-01 (×2): qty 1

## 2016-10-01 MED ORDER — ASPIRIN 300 MG RE SUPP: 300.0000 mg | Freq: Every day | RECTAL | Status: DC

## 2016-10-01 NOTE — ED Notes (Signed)
Tameka, rn [phone] notified that pt stated she was having hiccups and has them from time to time.

## 2016-10-01 NOTE — ED Notes (Signed)
Pt taken to MRI with [facility].

## 2016-10-01 NOTE — ED Triage Notes (Signed)
Pt comes in with trouble using her right arm 2 days ago. She had grip in this arm but states she has trouble writing or holding a pencil. Starting yesterday she has had trouble speaking (states she feels like she has a fat tongue), this is worse today. No airway swelling noted.

## 2016-10-01 NOTE — ED Provider Notes (Signed)
[facility] DEPT Provider Note   CSN: [id number] Arrival date & time: 10/01/16  0945  By signing my name below, I, Collene Leyden, attest that this documentation has been prepared under the direction and in the presence of Varney Biles, MD. Electronically Signed: Collene Leyden, Scribe. 10/01/16. 10:55 AM.  History   Chief Complaint Chief Complaint  Patient presents with  . Weakness  . Aphasia    HPI Comments: Shelley Frazier is a 51 y.o. female with a history of DM on metformin and HTN,  who presents to the Emergency Department complaining of sudden-onset, constant right arm weakness and pain that began 2 days ago. Patient states she has had trouble using her right arm for the past two days. Patient states she is able to grip things, but states she has trouble writing or holding a pencil. No numbness or tingling. Patient was last known normal two days ago. Patient denies having similar symptoms in the past. According to friend bedside, the patient's left nasolabial is abnormal. Patient reports associated resolved right hip/thigh pain and worsening speech difficulty. No modifying factors indicated. Patient denies any chest pain, shortness of breath, or any other symptoms. Patient does have a family history of heart attacks (60's).   HPI  Past Medical History:  Diagnosis Date  . Back pain   . Diabetes mellitus without complication ([place])   . Fatty liver   . Gallstones   . Hot flashes 07/30/2013  . Hypertension   . Obesity   . Perimenopausal symptoms 07/30/2013    Patient Active Problem List   Diagnosis Date Noted  . Hot flashes 07/30/2013  . Hypertension 07/30/2013  . Perimenopausal symptoms 07/30/2013    Past Surgical History:  Procedure Laterality Date  . TONSILLECTOMY AND ADENOIDECTOMY    . TUBAL LIGATION      OB History    Gravida Para Term Preterm AB Living   2 2 1 1   2    SAB TAB Ectopic Multiple Live Births           2       Home Medications    Prior to  Admission medications   Medication Sig Start Date End Date Taking? Authorizing Provider  COD LIVER OIL PO Take by mouth daily.   Yes Historical Provider, MD  glimepiride (AMARYL) 2 MG tablet Take 2 mg by mouth daily with breakfast.   Yes Historical Provider, MD  ibuprofen (ADVIL,MOTRIN) 200 MG tablet Take 200 mg by mouth every 6 (six) hours as needed for pain.   Yes Historical Provider, MD  lisinopril (PRINIVIL,ZESTRIL) 2.5 MG tablet Take 2.5 mg by mouth daily.   Yes Historical Provider, MD  lovastatin (MEVACOR) 40 MG tablet Take 40 mg by mouth at bedtime.   Yes Historical Provider, MD  metFORMIN (GLUCOPHAGE) 1000 MG tablet Take 1,000 mg by mouth 2 (two) times daily with a meal.   Yes Historical Provider, MD  naproxen sodium (ANAPROX) 220 MG tablet Take 440 mg by mouth 2 (two) times daily with a meal.   Yes Historical Provider, MD  Multiple Vitamin (MULTIVITAMIN) tablet Take 1 tablet by mouth daily.    Historical Provider, MD    Family History Family History  Problem Relation Age of Onset  . Hypertension Mother   . Gallbladder disease Mother   . Diabetes Father   . Hypertension Father   . Gallbladder disease Maternal Aunt   . Heart disease Paternal Aunt   . Heart disease Paternal Uncle   . Diabetes  Maternal Grandfather   . Heart disease Paternal Grandmother   . Diabetes Paternal Grandmother     Social History Social History  Substance Use Topics  . Smoking status: Never Smoker  . Smokeless tobacco: Never Used  . Alcohol use No     Allergies   Vicodin [hydrocodone-acetaminophen]   Review of Systems Review of Systems  Respiratory: Negative for shortness of breath.   Cardiovascular: Negative for chest pain.  Musculoskeletal: Positive for arthralgias (right hip/thigh).  Neurological: Positive for weakness (right arm). Negative for numbness.  All other systems reviewed and are negative.    Physical Exam Updated Vital Signs BP (!) 168/87   Pulse 79   Temp 97.7 F (36.5  C) (Oral)   Resp 19   Ht 5\' 3"  (1.6 m)   Wt 176 lb (79.8 kg)   LMP 04/18/2013   SpO2 98%   BMI 31.18 kg/m   Physical Exam  Constitutional: She is oriented to person, place, and time. She appears well-developed and well-nourished.  HENT:  Head: Normocephalic.  Slight nasolabial flattening on the right side.   Eyes: EOM are normal.  Neck: Normal range of motion.  Cardiovascular: Normal rate.   Pulmonary/Chest: Effort normal.  Abdominal: She exhibits no distension.  Musculoskeletal: Normal range of motion.  Subtle weakness with grip of the right extremity.   Neurological: She is alert and oriented to person, place, and time.  Cranial nerves 1-12 intact, except for nasolabial flattening. Upper and lower extremity sensory and motor exam are normal and equal.  Psychiatric: She has a normal mood and affect.  Nursing note and vitals reviewed.    ED Treatments / Results  DIAGNOSTIC STUDIES: Oxygen Saturation is 98% on RA, normal by my interpretation.    COORDINATION OF CARE: 10:53 AM Discussed treatment plan with pt at bedside and pt agreed to plan, which includes an admission for a stroke workup.   Labs (all labs ordered are listed, but only abnormal results are displayed) Labs Reviewed  APTT - Abnormal; Notable for the following:       Result Value   aPTT 23 (*)    All other components within normal limits  CBC - Abnormal; Notable for the following:    RBC 5.13 (*)    Hemoglobin 16.1 (*)    All other components within normal limits  COMPREHENSIVE METABOLIC PANEL - Abnormal; Notable for the following:    Sodium 132 (*)    Chloride 98 (*)    Glucose, Bld 390 (*)    All other components within normal limits  URINALYSIS, ROUTINE W REFLEX MICROSCOPIC - Abnormal; Notable for the following:    Color, Urine STRAW (*)    Glucose, UA >=500 (*)    Ketones, ur 20 (*)    All other components within normal limits  CBG MONITORING, ED - Abnormal; Notable for the following:     Glucose-Capillary 404 (*)    All other components within normal limits  PROTIME-INR  DIFFERENTIAL  RAPID URINE DRUG SCREEN, HOSP PERFORMED  I-STAT TROPOININ, ED    EKG  EKG Interpretation  Date/Time:  Friday Oct 01 2016 09:52:19 EDT Ventricular Rate:  80 PR Interval:    QRS Duration: 87 QT Interval:  396 QTC Calculation: 457 R Axis:   64 Text Interpretation:  Sinus rhythm No acute changes No significant change since last tracing Confirmed by Kathrynn Humble, MD, Thelma Comp [phone]) on 10/01/2016 10:28:34 AM       Radiology Ct Head Wo Contrast  Result Date:  10/01/2016 CLINICAL DATA:  Slurred speech since yesterday, unable to write with RIGHT hand, hypertension, diabetes mellitus EXAM: CT HEAD WITHOUT CONTRAST TECHNIQUE: Contiguous axial images were obtained from the base of the skull through the vertex without intravenous contrast. Sagittal and coronal MPR images reconstructed from axial data set. COMPARISON:  None FINDINGS: Brain: Normal ventricular morphology. No midline shift or mass effect. Minimal white matter hypoattenuation question chronic ischemic changes of deep cerebral white matter. Otherwise normal appearance of brain parenchyma. No intracranial hemorrhage, mass lesion, evidence of acute infarction, or extra-axial fluid collection. Vascular: Normal appearance Skull: Intact Sinuses/Orbits: Clear Other: N/A IMPRESSION: Question minimal small vessel chronic ischemic changes of deep cerebral white matter. No acute intracranial abnormalities. Electronically Signed   By: Lavonia Dana M.D.   On: 10/01/2016 11:32   Mr Brain Wo Contrast  Result Date: 10/01/2016 CLINICAL DATA:  Patient presents with RIGHT arm difficulty for 2 days. Difficulty speaking. EXAM: MRI HEAD WITHOUT CONTRAST TECHNIQUE: Multiplanar, multiecho pulse sequences of the brain and surrounding structures were obtained without intravenous contrast. COMPARISON:  CT head 10/01/2016. FINDINGS: Brain: Restricted diffusion LEFT paramedian  pons, consistent with acute infarction. No hemorrhage. The infarct is roughly 1 cm in size. No mass lesion, hydrocephalus or extra-axial fluid. Mild cerebral and cerebellar atrophy, premature for age. T2 and FLAIR hyperintensities in the periventricular and subcortical white matter, also moderately advanced, suggesting chronic microvascular ischemic change, likely secondary to hypertension and diabetes. Vascular: Normal flow voids. Skull and upper cervical spine: Normal marrow signal. Sinuses/Orbits: Negative. Other: None. Compared with earlier CT, the infarct is not visible. IMPRESSION: Acute nonhemorrhagic LEFT pontine brainstem infarct. Premature for age atrophy and chronic microvascular ischemic change. Electronically Signed   By: Staci Righter M.D.   On: 10/01/2016 13:01    Procedures Procedures (including critical care time)  Medications Ordered in ED Medications - No data to display   Initial Impression / Assessment and Plan / ED Course  I have reviewed the triage vital signs and the nursing notes.  Pertinent labs & imaging results that were available during my care of the patient were reviewed by me and considered in my medical decision making (see chart for details).     Pt comes in with cc of R sided weakness. She has mild facial nasolabial flattening.  NIHSS - 2 (1 FOR FACIAL SWELLING AND 1 FOR SLURRED SPEECH).  Not a TPA candidate. MRI confirms a stroke.  Results from the ER workup discussed with the patient face to face and all questions answered to the best of my ability.   We will admit to medicine.  Final Clinical Impressions(s) / ED Diagnoses   Final diagnoses:  Left pontine stroke [facility])    New Prescriptions New Prescriptions   No medications on file   I personally performed the services described in this documentation, which was scribed in my presence. The recorded information has been reviewed and is accurate.    Varney Biles, MD 10/01/16 1348

## 2016-10-01 NOTE — Progress Notes (Signed)
This RN spoke to patient and family about transfer to [facility] for closer observation d/t patient high risk status. Patient tearful at one time, stating she is overwhelmed. Comfort and reassurance provided to family. Questions answered. Patient's spouse at bedside. Waiting for transportation.

## 2016-10-01 NOTE — H&P (Signed)
History and Physical    Shelley Frazier ASN:[id number] DOB: 07-22-1965 DOA: 10/01/2016  Referring MD/NP/PA: Varney Biles, EDP PCP: Jani Gravel, MD  Patient coming from: Home  Chief Complaint: right sided weakness, slurred speech  HPI: Shelley Frazier is a 51 y.o. female with h/o HTN, DM II, nonsmoker, here today with a 3 day history of slurred speech, right facial droop and right sided weakness. She has mainly a decreased grip and difficulty writing (she is right-handed). MRI in the ED shows an acute non-hemorrhagic left pontine brainstem infarct and admission is requested.  Past Medical/Surgical History: Past Medical History:  Diagnosis Date  . Back pain   . Diabetes mellitus without complication ([place])   . Fatty liver   . Gallstones   . Hot flashes 07/30/2013  . Hypertension   . Obesity   . Perimenopausal symptoms 07/30/2013    Past Surgical History:  Procedure Laterality Date  . TONSILLECTOMY AND ADENOIDECTOMY    . TUBAL LIGATION      Social History:  reports that she has never smoked. She has never used smokeless tobacco. She reports that she does not drink alcohol or use drugs.  Allergies: Allergies  Allergen Reactions  . Vicodin [Hydrocodone-Acetaminophen] Nausea And Vomiting    Family History:  Family History  Problem Relation Age of Onset  . Hypertension Mother   . Gallbladder disease Mother   . Diabetes Father   . Hypertension Father   . Gallbladder disease Maternal Aunt   . Heart disease Paternal Aunt   . Heart disease Paternal Uncle   . Diabetes Maternal Grandfather   . Heart disease Paternal Grandmother   . Diabetes Paternal Grandmother     Prior to Admission medications   Medication Sig Start Date End Date Taking? Authorizing Provider  COD LIVER OIL PO Take by mouth daily.   Yes Historical Provider, MD  glimepiride (AMARYL) 2 MG tablet Take 2 mg by mouth daily with breakfast.   Yes Historical Provider, MD  ibuprofen (ADVIL,MOTRIN) 200 MG tablet  Take 200 mg by mouth every 6 (six) hours as needed for pain.   Yes Historical Provider, MD  lisinopril (PRINIVIL,ZESTRIL) 2.5 MG tablet Take 2.5 mg by mouth daily.   Yes Historical Provider, MD  lovastatin (MEVACOR) 40 MG tablet Take 40 mg by mouth at bedtime.   Yes Historical Provider, MD  metFORMIN (GLUCOPHAGE) 1000 MG tablet Take 1,000 mg by mouth 2 (two) times daily with a meal.   Yes Historical Provider, MD  naproxen sodium (ANAPROX) 220 MG tablet Take 440 mg by mouth 2 (two) times daily with a meal.   Yes Historical Provider, MD  Multiple Vitamin (MULTIVITAMIN) tablet Take 1 tablet by mouth daily.    Historical Provider, MD    Review of Systems:  Constitutional: Denies fever, chills, diaphoresis, appetite change and fatigue.  HEENT: Denies photophobia, eye pain, redness, hearing loss, ear pain, congestion, sore throat, rhinorrhea, sneezing, mouth sores, trouble swallowing, neck pain, neck stiffness and tinnitus.   Respiratory: Denies SOB, DOE, cough, chest tightness,  and wheezing.   Cardiovascular: Denies chest pain, palpitations and leg swelling.  Gastrointestinal: Denies nausea, vomiting, abdominal pain, diarrhea, constipation, blood in stool and abdominal distention.  Genitourinary: Denies dysuria, urgency, frequency, hematuria, flank pain and difficulty urinating.  Endocrine: Denies: hot or cold intolerance, sweats, changes in hair or nails, polyuria, polydipsia. Musculoskeletal: Denies myalgias, back pain, joint swelling, arthralgias and gait problem.  Skin: Denies pallor, rash and wound.  Neurological: Denies dizziness, seizures, syncope,  light-headedness, numbness and headaches.  Hematological: Denies adenopathy. Easy bruising, personal or family bleeding history  Psychiatric/Behavioral: Denies suicidal ideation, mood changes, confusion, nervousness, sleep disturbance and agitation    Physical Exam: Vitals:   10/01/16 1615 10/01/16 1630 10/01/16 1700 10/01/16 1747  BP:   132/83 (!) 146/105 (!) 176/77  Pulse: 90 87 86 82  Resp: 16 (!) 23 15 18   Temp:    98.4 F (36.9 C)  TempSrc:    Oral  SpO2: 98% 98% 96% 98%  Weight:    78.4 kg (172 lb 12.8 oz)  Height:         Constitutional: NAD, calm, comfortable Eyes: PERRL, lids and conjunctivae normal ENMT: Mucous membranes are moist. Posterior pharynx clear of any exudate or lesions.Normal dentition.  Neck: normal, supple, no masses, no thyromegaly Respiratory: clear to auscultation bilaterally, no wheezing, no crackles. Normal respiratory effort. No accessory muscle use.  Cardiovascular: Regular rate and rhythm, no murmurs / rubs / gallops. No extremity edema. 2+ pedal pulses. No carotid bruits.  Abdomen: no tenderness, no masses palpated. No hepatosplenomegaly. Bowel sounds positive.  Musculoskeletal: no clubbing / cyanosis. No joint deformity upper and lower extremities. Good ROM, no contractures. Normal muscle tone.  Skin: no rashes, lesions, ulcers. No induration Neurologic: CN 2-12 grossly intact, except for right facial droop, markedly decreased right grip strength, uncoordinated finger nose on right, babinski downgoing bilaterally. Psychiatric: Normal judgment and insight. Alert and oriented x 3. Normal mood.    Labs on Admission: I have personally reviewed the following labs and imaging studies  CBC:  Recent Labs Lab 10/01/16 1029  WBC 8.7  NEUTROABS 6.4  HGB 16.1*  HCT 44.9  MCV 87.5  PLT 408   Basic Metabolic Panel:  Recent Labs Lab 10/01/16 1029  NA 132*  K 3.8  CL 98*  CO2 23  GLUCOSE 390*  BUN 13  CREATININE 0.69  CALCIUM 9.2   GFR: Estimated Creatinine Clearance: 82.5 mL/min (by C-G formula based on SCr of 0.69 mg/dL). Liver Function Tests:  Recent Labs Lab 10/01/16 1029  AST 27  ALT 43  ALKPHOS 79  BILITOT 1.0  PROT 7.3  ALBUMIN 3.8   No results for input(s): LIPASE, AMYLASE in the last 168 hours. No results for input(s): AMMONIA in the last 168  hours. Coagulation Profile:  Recent Labs Lab 10/01/16 1029  INR 0.93   Cardiac Enzymes: No results for input(s): CKTOTAL, CKMB, CKMBINDEX, TROPONINI in the last 168 hours. BNP (last 3 results) No results for input(s): PROBNP in the last 8760 hours. HbA1C: No results for input(s): HGBA1C in the last 72 hours. CBG:  Recent Labs Lab 10/01/16 0954 10/01/16 1607  GLUCAP 404* 319*   Lipid Profile: No results for input(s): CHOL, HDL, LDLCALC, TRIG, CHOLHDL, LDLDIRECT in the last 72 hours. Thyroid Function Tests: No results for input(s): TSH, T4TOTAL, FREET4, T3FREE, THYROIDAB in the last 72 hours. Anemia Panel: No results for input(s): VITAMINB12, FOLATE, FERRITIN, TIBC, IRON, RETICCTPCT in the last 72 hours. Urine analysis:    Component Value Date/Time   COLORURINE STRAW (A) 10/01/2016 1029   APPEARANCEUR CLEAR 10/01/2016 1029   LABSPEC 1.027 10/01/2016 1029   PHURINE 5.0 10/01/2016 1029   GLUCOSEU >=500 (A) 10/01/2016 1029   HGBUR NEGATIVE 10/01/2016 1029   BILIRUBINUR NEGATIVE 10/01/2016 1029   KETONESUR 20 (A) 10/01/2016 1029   PROTEINUR NEGATIVE 10/01/2016 1029   UROBILINOGEN 0.2 12/13/2007 2148   NITRITE NEGATIVE 10/01/2016 1029   LEUKOCYTESUR NEGATIVE 10/01/2016 1029  Sepsis Labs: @LABRCNTIP (procalcitonin:4,lacticidven:4) )No results found for this or any previous visit (from the past 240 hour(s)).   Radiological Exams on Admission: Ct Head Wo Contrast  Result Date: 10/01/2016 CLINICAL DATA:  Slurred speech since yesterday, unable to write with RIGHT hand, hypertension, diabetes mellitus EXAM: CT HEAD WITHOUT CONTRAST TECHNIQUE: Contiguous axial images were obtained from the base of the skull through the vertex without intravenous contrast. Sagittal and coronal MPR images reconstructed from axial data set. COMPARISON:  None FINDINGS: Brain: Normal ventricular morphology. No midline shift or mass effect. Minimal white matter hypoattenuation question chronic ischemic  changes of deep cerebral white matter. Otherwise normal appearance of brain parenchyma. No intracranial hemorrhage, mass lesion, evidence of acute infarction, or extra-axial fluid collection. Vascular: Normal appearance Skull: Intact Sinuses/Orbits: Clear Other: N/A IMPRESSION: Question minimal small vessel chronic ischemic changes of deep cerebral white matter. No acute intracranial abnormalities. Electronically Signed   By: Lavonia Dana M.D.   On: 10/01/2016 11:32   Mr Brain Wo Contrast  Result Date: 10/01/2016 CLINICAL DATA:  Patient presents with RIGHT arm difficulty for 2 days. Difficulty speaking. EXAM: MRI HEAD WITHOUT CONTRAST TECHNIQUE: Multiplanar, multiecho pulse sequences of the brain and surrounding structures were obtained without intravenous contrast. COMPARISON:  CT head 10/01/2016. FINDINGS: Brain: Restricted diffusion LEFT paramedian pons, consistent with acute infarction. No hemorrhage. The infarct is roughly 1 cm in size. No mass lesion, hydrocephalus or extra-axial fluid. Mild cerebral and cerebellar atrophy, premature for age. T2 and FLAIR hyperintensities in the periventricular and subcortical white matter, also moderately advanced, suggesting chronic microvascular ischemic change, likely secondary to hypertension and diabetes. Vascular: Normal flow voids. Skull and upper cervical spine: Normal marrow signal. Sinuses/Orbits: Negative. Other: None. Compared with earlier CT, the infarct is not visible. IMPRESSION: Acute nonhemorrhagic LEFT pontine brainstem infarct. Premature for age atrophy and chronic microvascular ischemic change. Electronically Signed   By: Staci Righter M.D.   On: 10/01/2016 13:01    EKG: Independently reviewed. NSR, no acute changes  Assessment/Plan Principal Problem:   Acute CVA (cerebrovascular accident) ([place]) Active Problems:   Hypertension   DM (diabetes mellitus), type 2 with neurological complications ([place])    Acute CVA -Start ASA for secondary stroke  prophylaxis. -PT/OT/ST evaluations. -Check ECHO/dopplers/lipids. -Given her young age will also check a hypercoagulable panel  HTN -Fair control. -Continue home medications, but will allow permissive HTN given acute CVA.  DM II -Check A1C. -Continue amaryl. -Start sensitive SSI without basal insulin.   DVT prophylaxis: lovenox  Code Status: full code  Family Communication: multiple family members at bedside updated on plan of care and all questions answered  Disposition Plan: hope for DC home in 24-48 hours with completion of work up  [employer] called: neurology  Admission status: observation    Time Spent: 75 minutes  Lelon Frohlich MD [facility] Pager [phone]  If 7PM-7AM, please contact night-coverage [email or web address] Password [facility]  10/01/2016, 6:16 PM

## 2016-10-01 NOTE — ED Notes (Signed)
Pt states that Dr. Kathrynn Humble informed her that she could eat something. Pt is getting her daughter to get her a subway sandwich.  Pt offered meal tray but prefers subway.

## 2016-10-01 NOTE — ED Notes (Signed)
Dr. Kathrynn Humble aware of hypertension.

## 2016-10-01 NOTE — Progress Notes (Signed)
Notified [facility] of me being notified to come to the MRI due the patient becoming disoriented, sweating, and stating she felt like she was going to pass out.  When I got to her her CBG was 333 and she stated she felt better.  Passed on to the oncoming nurse.  She verbalized understanding.

## 2016-10-01 NOTE — ED Notes (Signed)
Pt ambulated to bathroom, hooked back up to monitor.

## 2016-10-02 ENCOUNTER — Encounter (HOSPITAL_COMMUNITY): Payer: Self-pay

## 2016-10-02 ENCOUNTER — Observation Stay (HOSPITAL_COMMUNITY): Payer: BC Managed Care – PPO

## 2016-10-02 DIAGNOSIS — Z885 Allergy status to narcotic agent status: Secondary | ICD-10-CM | POA: Diagnosis not present

## 2016-10-02 DIAGNOSIS — I639 Cerebral infarction, unspecified: Secondary | ICD-10-CM | POA: Diagnosis not present

## 2016-10-02 DIAGNOSIS — R471 Dysarthria and anarthria: Secondary | ICD-10-CM | POA: Diagnosis not present

## 2016-10-02 DIAGNOSIS — I5032 Chronic diastolic (congestive) heart failure: Secondary | ICD-10-CM | POA: Diagnosis present

## 2016-10-02 DIAGNOSIS — I6329 Cerebral infarction due to unspecified occlusion or stenosis of other precerebral arteries: Secondary | ICD-10-CM | POA: Diagnosis present

## 2016-10-02 DIAGNOSIS — G8191 Hemiplegia, unspecified affecting right dominant side: Secondary | ICD-10-CM

## 2016-10-02 DIAGNOSIS — I6350 Cerebral infarction due to unspecified occlusion or stenosis of unspecified cerebral artery: Secondary | ICD-10-CM | POA: Diagnosis not present

## 2016-10-02 DIAGNOSIS — I651 Occlusion and stenosis of basilar artery: Secondary | ICD-10-CM | POA: Diagnosis not present

## 2016-10-02 DIAGNOSIS — I110 Hypertensive heart disease with heart failure: Secondary | ICD-10-CM | POA: Diagnosis present

## 2016-10-02 DIAGNOSIS — R739 Hyperglycemia, unspecified: Secondary | ICD-10-CM | POA: Diagnosis not present

## 2016-10-02 DIAGNOSIS — R197 Diarrhea, unspecified: Secondary | ICD-10-CM | POA: Diagnosis not present

## 2016-10-02 DIAGNOSIS — E785 Hyperlipidemia, unspecified: Secondary | ICD-10-CM | POA: Diagnosis not present

## 2016-10-02 DIAGNOSIS — E782 Mixed hyperlipidemia: Secondary | ICD-10-CM | POA: Diagnosis present

## 2016-10-02 DIAGNOSIS — Z79899 Other long term (current) drug therapy: Secondary | ICD-10-CM | POA: Diagnosis not present

## 2016-10-02 DIAGNOSIS — R4701 Aphasia: Secondary | ICD-10-CM | POA: Diagnosis present

## 2016-10-02 DIAGNOSIS — I10 Essential (primary) hypertension: Secondary | ICD-10-CM | POA: Diagnosis not present

## 2016-10-02 DIAGNOSIS — Z833 Family history of diabetes mellitus: Secondary | ICD-10-CM | POA: Diagnosis not present

## 2016-10-02 DIAGNOSIS — R269 Unspecified abnormalities of gait and mobility: Secondary | ICD-10-CM | POA: Diagnosis not present

## 2016-10-02 DIAGNOSIS — E1149 Type 2 diabetes mellitus with other diabetic neurological complication: Secondary | ICD-10-CM | POA: Diagnosis present

## 2016-10-02 DIAGNOSIS — I69322 Dysarthria following cerebral infarction: Secondary | ICD-10-CM | POA: Diagnosis not present

## 2016-10-02 DIAGNOSIS — E162 Hypoglycemia, unspecified: Secondary | ICD-10-CM | POA: Diagnosis not present

## 2016-10-02 DIAGNOSIS — Z7984 Long term (current) use of oral hypoglycemic drugs: Secondary | ICD-10-CM | POA: Diagnosis not present

## 2016-10-02 DIAGNOSIS — K909 Intestinal malabsorption, unspecified: Secondary | ICD-10-CM | POA: Diagnosis not present

## 2016-10-02 DIAGNOSIS — Z8249 Family history of ischemic heart disease and other diseases of the circulatory system: Secondary | ICD-10-CM | POA: Diagnosis not present

## 2016-10-02 DIAGNOSIS — R740 Nonspecific elevation of levels of transaminase and lactic acid dehydrogenase [LDH]: Secondary | ICD-10-CM | POA: Diagnosis not present

## 2016-10-02 DIAGNOSIS — R29810 Facial weakness: Secondary | ICD-10-CM | POA: Diagnosis present

## 2016-10-02 DIAGNOSIS — Z6830 Body mass index (BMI) 30.0-30.9, adult: Secondary | ICD-10-CM | POA: Diagnosis not present

## 2016-10-02 DIAGNOSIS — Z791 Long term (current) use of non-steroidal anti-inflammatories (NSAID): Secondary | ICD-10-CM | POA: Diagnosis not present

## 2016-10-02 DIAGNOSIS — E1165 Type 2 diabetes mellitus with hyperglycemia: Secondary | ICD-10-CM | POA: Diagnosis not present

## 2016-10-02 DIAGNOSIS — K760 Fatty (change of) liver, not elsewhere classified: Secondary | ICD-10-CM | POA: Diagnosis present

## 2016-10-02 DIAGNOSIS — E669 Obesity, unspecified: Secondary | ICD-10-CM | POA: Diagnosis present

## 2016-10-02 LAB — LIPID PANEL
CHOL/HDL RATIO: 10.7 ratio
CHOLESTEROL: 267 mg/dL — AB (ref 0–200)
HDL: 25 mg/dL — ABNORMAL LOW (ref >40)
LDL Cholesterol: UNDETERMINED mg/dL (ref 0–99)
Triglycerides: 660 mg/dL — ABNORMAL HIGH (ref <150)
VLDL: UNDETERMINED mg/dL (ref 0–40)

## 2016-10-02 LAB — HIV ANTIBODY (ROUTINE TESTING W REFLEX): HIV Screen 4th Generation wRfx: NONREACTIVE

## 2016-10-02 LAB — GLUCOSE, CAPILLARY
GLUCOSE-CAPILLARY: 366 mg/dL — AB (ref 65–99)
GLUCOSE-CAPILLARY: 303 mg/dL — AB (ref 65–99)
Glucose-Capillary: 360 mg/dL — ABNORMAL HIGH (ref 65–99)
Glucose-Capillary: 210 mg/dL — ABNORMAL HIGH (ref 65–99)

## 2016-10-02 LAB — ANTITHROMBIN III: ANTITHROMB III FUNC: 119 % (ref 75–120)

## 2016-10-02 MED ORDER — INSULIN ASPART 100 UNIT/ML ~~LOC~~ SOLN
3.0000 [IU] | Freq: Three times a day (TID) | SUBCUTANEOUS | Status: DC
Start: 2016-10-02 — End: 2016-10-02

## 2016-10-02 MED ORDER — ENSURE ENLIVE PO LIQD
237.0000 mL | ORAL | Status: DC
  Administered 2016-10-03 – 2016-10-04 (×2): 237 mL via ORAL

## 2016-10-02 MED ORDER — IOPAMIDOL (ISOVUE-370) INJECTION 76%
INTRAVENOUS | Status: AC
  Administered 2016-10-02: 50 mL
  Filled 2016-10-02: qty 50

## 2016-10-02 MED ORDER — INSULIN GLARGINE 100 UNIT/ML ~~LOC~~ SOLN
20.0000 [IU] | Freq: Every day | SUBCUTANEOUS | Status: DC
Start: 2016-10-02 — End: 2016-10-03
  Administered 2016-10-02: 20 [IU] via SUBCUTANEOUS
  Filled 2016-10-02: qty 0.2

## 2016-10-02 MED ORDER — CLOPIDOGREL BISULFATE 75 MG PO TABS
75.0000 mg | ORAL_TABLET | Freq: Every day | ORAL | Status: DC
  Administered 2016-10-02 – 2016-10-04 (×3): 75 mg via ORAL
  Filled 2016-10-02 (×3): qty 1

## 2016-10-02 MED ORDER — ENSURE ENLIVE PO LIQD
237.0000 mL | Freq: Two times a day (BID) | ORAL | Status: DC
  Administered 2016-10-02: 237 mL via ORAL

## 2016-10-02 NOTE — Progress Notes (Signed)
Nutrition Brief Note  Patient identified on the Malnutrition Screening Tool (MST) Report  Wt Readings from Last 15 Encounters:  10/01/16 172 lb 12.8 oz (78.4 kg)  08/30/16 179 lb (81.2 kg)  07/28/16 179 lb 8 oz (81.4 kg)  07/30/13 198 lb 8 oz (90 kg)  10/20/12 206 lb (93.4 kg)    Body mass index is 30.61 kg/m. Patient meets criteria for obesity based on current BMI. She has lost 7 lbs (4% body weight) in the past 1 month; not significant for time frame. Suspect weight loss may be partly d/t prolonged hyperglycemia. Skin WDL.  Pt admitted following acute stroke in L pons. Current diet order is Heart Healthy/Carb Modified, patient is consuming approximately 100% of meals at this time. Breakfast was ~520 kcal and 16 grams of protein and lunch was ~550 kcal and 21 grams of protein.   Medications reviewed; sliding scale Novolog, 3 units Novolog TID, 20 units Lantus/day.  Labs reviewed; CBGs: 360 and 366 mg/dL today.  Ensure Enlive ordered BID and pt received 1 dose so far today. Will decrease to once/day. This supplement provides 350 kcal and 20 grams of protein. No nutrition interventions warranted at this time. If nutrition issues arise, please consult RD.     Jarome Matin, MS, RD, LDN, [facility] Inpatient Clinical Dietitian Pager # [phone] After hours/weekend pager # [phone]

## 2016-10-02 NOTE — Consult Note (Addendum)
Neurology Consult Note  Reason for Consultation: Stroke  Requesting provider: Domenic Polite, MD  CC: Slurred speech, R-sided weakness  HPI: This is a 35-yo RH woman who is transferred from [facility] for evaluation of stroke. She reports that she developed slurred speech on 09/30/16, describing stumbling over some of her words. This was accompanied by a sense of heaviness and weakness in the R arm and leg as well as some R facial droop. She denies any vision loss, double vision, difficulty swallowing, numbness/tingling, or coordination issues. She states that she has to hold on to something in order to walk due to the weakness in her R leg but has not fallen. She presented to [facility] where MRI brain showed an acute stroke in the L pons. 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   She has received aspirin and atorvastatin during this admission. She was not taking any antiplatelets or statins at home.   Last known well: 09/30/16 NHISS score: 2 mRS score: 0 tPA given?: No, out of window with completed stroke on MRI   PMH:  Past Medical History:  Diagnosis Date  . Back pain   . Diabetes mellitus without complication ([place])   . Fatty liver   . Gallstones   . Hot flashes 07/30/2013  . Hypertension   . Obesity   . Perimenopausal symptoms 07/30/2013    PSH:  Past Surgical History:  Procedure Laterality Date  . TONSILLECTOMY AND ADENOIDECTOMY    . TUBAL LIGATION      Family history: Family History  Problem Relation Age of Onset  . Hypertension Mother   . Gallbladder disease Mother   . Diabetes Father   . Hypertension Father   . Gallbladder disease Maternal Aunt   . Heart disease Paternal Aunt   . Heart disease Paternal Uncle   . Diabetes Maternal Grandfather    . Heart disease Paternal Grandmother   . Diabetes Paternal Grandmother     Social history:  Social History   Social History  . Marital status: Married    Spouse name: N/A  . Number of children: N/A  . Years of education: N/A   Occupational History  . Not on file.   Social History Main Topics  . Smoking status: Never Smoker  . Smokeless tobacco: Never Used  . Alcohol use No  . Drug use: No  . Sexual activity: Yes    Birth control/ protection: Surgical, Post-menopausal     Comment: tubal   Other Topics Concern  . Not on file   Social History Narrative  . No narrative on file    Current outpatient meds: Medications reviewed and reconciled Current Meds  Medication Sig  . COD LIVER OIL PO Take by mouth daily.  Marland Kitchen glimepiride (AMARYL) 2 MG tablet Take 2 mg by mouth daily with breakfast.  . ibuprofen (ADVIL,MOTRIN) 200 MG tablet Take 200 mg by mouth every 6 (six) hours as needed for pain.  Marland Kitchen lisinopril (PRINIVIL,ZESTRIL) 2.5 MG tablet Take 2.5 mg by mouth daily.  Marland Kitchen lovastatin (MEVACOR) 40 MG tablet Take 40 mg by mouth at bedtime.  . metFORMIN (GLUCOPHAGE) 1000 MG tablet Take 1,000 mg by mouth 2 (two) times daily with a meal.  . naproxen sodium (ANAPROX) 220 MG tablet Take 440 mg by mouth 2 (two) times daily with a meal.  Current inpatient meds: Medications reviewed and reconciled Current Facility-Administered Medications  Medication Dose Route Frequency Provider Last Rate Last Dose  .  stroke: mapping our early stages of recovery book   Does not apply Once Isaac Bliss, Rayford Halsted, MD      . 0.9 %  sodium chloride infusion   Intravenous Continuous Isaac Bliss, Rayford Halsted, MD 75 mL/hr at 10/01/16 2129    . acetaminophen (TYLENOL) tablet 650 mg  650 mg Oral Q4H PRN Isaac Bliss, Rayford Halsted, MD       Or  . acetaminophen (TYLENOL) solution 650 mg  650 mg Per Tube Q4H PRN Isaac Bliss, Rayford Halsted, MD       Or  . acetaminophen (TYLENOL) suppository 650 mg  650 mg  Rectal Q4H PRN Isaac Bliss, Rayford Halsted, MD      . aspirin suppository 300 mg  300 mg Rectal Daily Isaac Bliss, Rayford Halsted, MD       Or  . aspirin tablet 325 mg  325 mg Oral Daily Isaac Bliss, Rayford Halsted, MD   325 mg at 10/01/16 2056  . enoxaparin (LOVENOX) injection 40 mg  40 mg Subcutaneous Q24H Isaac Bliss, Rayford Halsted, MD   40 mg at 10/01/16 2056  . feeding supplement (ENSURE ENLIVE) (ENSURE ENLIVE) liquid 237 mL  237 mL Oral BID BM Isaac Bliss, Rayford Halsted, MD      . glimepiride (AMARYL) tablet 2 mg  2 mg Oral Q breakfast Isaac Bliss, Rayford Halsted, MD      . insulin aspart (novoLOG) injection 0-5 Units  0-5 Units Subcutaneous QHS Isaac Bliss, Rayford Halsted, MD   4 Units at 10/01/16 2100  . insulin aspart (novoLOG) injection 0-9 Units  0-9 Units Subcutaneous TID WC Isaac Bliss, Rayford Halsted, MD      . insulin aspart (novoLOG) injection 3 Units  3 Units Subcutaneous TID WC Isaac Bliss, Rayford Halsted, MD      . lisinopril (PRINIVIL,ZESTRIL) tablet 2.5 mg  2.5 mg Oral Daily Isaac Bliss, Rayford Halsted, MD   2.5 mg at 10/01/16 2055  . multivitamin with minerals tablet 1 tablet  1 tablet Oral Daily Isaac Bliss, Rayford Halsted, MD   1 tablet at 10/01/16 2055  . pravastatin (PRAVACHOL) tablet 40 mg  40 mg Oral q1800 Isaac Bliss, Rayford Halsted, MD   40 mg at 10/01/16 2055  . senna-docusate (Senokot-S) tablet 1 tablet  1 tablet Oral QHS PRN Isaac Bliss, Rayford Halsted, MD        Allergies: Allergies  Allergen Reactions  . Vicodin [Hydrocodone-Acetaminophen] Nausea And Vomiting    ROS: As per HPI. A full 14-point review of systems was performed and is otherwise unremarkable.   PE:  BP (!) 155/87 (BP Location: Right Arm)   Pulse 84   Temp 98.3 F (36.8 C) (Oral)   Resp 18   Ht 5\' 3"  (1.6 m)   Wt 78.4 kg (172 lb 12.8 oz)   LMP 04/18/2013   SpO2 99%   BMI 30.61 kg/m   General: WDWN, no acute distress. AAO x4. Speech notable for mild dysarthria. No aphasia. Follows commands  briskly. Affect is bright with congruent mood. Comportment is normal.  HEENT: Normocephalic. Neck supple without LAD. MMM, OP clear. Dentition good. Sclerae anicteric. No conjunctival injection.  CV: Regular, no murmur. Carotid pulses full and symmetric, no bruits. Distal pulses 2+ and symmetric.  Lungs: CTAB.  Abdomen: Soft, obese, non-distended.  Extremities: No C/C/E. Neuro:  CN: Pupils are equal and  round. They are symmetrically reactive from 3-->2 mm. Visual fields are full. EOMI without nystagmus. No reported diplopia. Facial sensation is intact to light touch. Face is notable for a mild R central VII pattern of weakness. Hearing is intact to conversational voice. Palate elevates symmetrically and uvula is midline. Voice is normal in tone, pitch and quality. Bilateral SCM and trapezii are 5/5. Tongue is midline with normal bulk and mobility.  Motor: Normal bulk, tone. Strength is normal except for 4+/5 strength in R triceps, wrist extensors, finger extensors, hip flexion, knee flexion, and ankle dorsiflexion. No tremor or other abnormal movements. No drift.  Sensation: Intact to light touch and pinprick.  DTRs: 3+, brisker on the R. Toes downgoing on the L, upgoing on the R.  Coordination: Finger-to-nose and heel-to-shin are slower on the R but without dysmetria. Finger taps are slow and clumsy on the R.    Labs:  Lab Results  Component Value Date   WBC 9.1 10/01/2016   HGB 15.0 10/01/2016   HCT 41.5 10/01/2016   PLT 267 10/01/2016   GLUCOSE 390 (H) 10/01/2016   CHOL 275 (H) 07/30/2013   TRIG 224 (H) 07/30/2013   HDL 39 (L) 07/30/2013   LDLCALC 191 (H) 07/30/2013   ALT 43 10/01/2016   AST 27 10/01/2016   NA 132 (L) 10/01/2016   K 3.8 10/01/2016   CL 98 (L) 10/01/2016   CREATININE 0.77 10/01/2016   BUN 13 10/01/2016   CO2 23 10/01/2016   TSH 0.665 07/30/2013   INR 0.93 10/01/2016   HGBA1C 12.8 (H) 07/30/2013   PTT 23 Urine drug screen negative UA notable for glucose >500  and ketones 20 Troponin 0.00 HIV nonreactive  Pending labs; Hypercoag panel Hgb a1c  Imaging:  I have personally and independently reviewed the MRI of the brain without contrast from 10/01/16. This shows a focal area of restricted diffusion in the paramedian aspect of the left pons that is consistent with an acute ischemic infarction. There is a mild degree of chronic small vessel ischemic disease involving the bihemispheric white matter. Volumes are normal for age. Ventricles are normal in size and configuration.   I have personally and independently reviewed the MRA of the head from 10/01/16. There are no occlusions. There appears to be a focal area of stenosis in the midportion of the basilar artery. There is a linear defect within the vessel that passes through this area of stenosis. While this could possibly represent a dissection flap, it may also be artifactual in nature given similar linear hypointensities in both carotid arteries at the same level. The proximal ACAs are not well visualized but this is likely due to artifact given normal filling distally.   Assessment and Plan:  1. Acute Ischemic Stroke: This is an acute stroke involving the pontine perforators. It is most likely thrombotic in etiology. Known risk factors for cerebrovascular disease in this patient include DM, dyslipidemia, HTN, and obesity. MRA showed possible stenosis with questionable dissection (vs artifact) in the basilar artery. I will order CTA of the head and neck to further assess her posterior circulation. I will d/c CUS. TTE and Hgb a1c ordered and pending. I will add fasting lipids to AM labs. Further testing will be determined by results from these initial studies. Recommend continuing antiplatelet therapy with aspirin for secondary stroke prevention. If CTA confirms basilar stenosis may need to add clopidogrel for three months of dual antiplatelet therapy. Continue statin with goal LDL less than 70. Ensure adequate  glucose control. Allow permissive hypertension in the acute phase, treating only SBP greater than 220 mmHg and/or DBP greater than 110 mmHg. Avoid fever and hyperglycemia as these can extend the infarct and are associated with worse neurologic outcomes. Initiate rehab services. DVT prophylaxis as needed.   2. Right hemiparesis: This is acute, due to stroke. This is mild. PT/OT/rehab as needed.   3. Dysarthria: This is acute, due to stroke. This is mild. Follow.   4. Abnormality of gait: This is acute, due to stroke with right-sided weakness. PT/rehab as needed.   The patient would likely benefit from acute rehab services. Recommend consultation of PT/OT with consideration for PM&R consult as appropriate depending upon clinical progress and therapists' recommendations.   Fall risk: Risk factors for falls include new right sided weakness from stroke. Fall precautions. Patient has good insight into her deficits. Limit psychoactive medications and sedating medications.  Needs outpatient neurology follow-up?: She will need to f/u with vascular neurology; this can be arranged after discharge.   This was discussed with the patient and her husband. Education was provided on the diagnosis and expected evaluation and treatment. They are in agreement with the plan as noted. They were given the opportunity to ask any questions and these were addressed to their satisfaction.   Thank you for this consultation. The stroke team will assume care of the patient beginning 10/03/16. Please call if any urgent questions or concerns.

## 2016-10-02 NOTE — Progress Notes (Signed)
Per [facility], patient will not transfer until early morning. Patient's spouse notified.

## 2016-10-02 NOTE — Progress Notes (Addendum)
PROGRESS NOTE    Shelley Frazier  PFX:[id number] DOB: 01/25/1966 DOA: 10/01/2016 PCP: Jani Gravel, MD  Brief Narrative:This is a 75-yo RH woman who is transferred from [facility] for evaluation of stroke. She reports that she developed slurred speech on 09/30/16, with  weakness in the R arm and leg as well as some R facial droop. She presented to [facility] where MRI brain showed an acute stroke in the L pons. 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 -hold lisinopril to allow for permissible HTN  DM II -FU A1C, hold amaryl -CBGS in 300s, start lantus, may need Insulin at DC, d/w pt about this -DM coordinator consult  DVT prophylaxis: lovenox  Code Status: full code  Family Communication: spouse at bedside Disposition Plan: hope for DC home in 24-48 hours with completion of work up   Consultants:   Neuro   Subjective: R sided weakness unchanged  Objective: Vitals:   10/01/16 2130 10/01/16 2330 10/02/16 0130 10/02/16 0615  BP: (!) 160/61 (!) 143/59 (!) 119/57 (!) 155/87  Pulse: 83 69 71 84  Resp: 18 18 18 18   Temp: 97.7 F (36.5 C) 97.5 F (36.4 C)  98.3 F (36.8 C)  TempSrc: Oral Oral  Oral  SpO2: 99% 100% 100% 99%  Weight:      Height:        Intake/Output Summary (Last 24 hours) at 10/02/16 1339 Last data filed at 10/02/16 0520  Gross per 24 hour  Intake           708.75 ml  Output              800 ml  Net           -91.25 ml    Filed Weights   10/01/16 0951 10/01/16 1747  Weight: 79.8 kg (176 lb) 78.4 kg (172 lb 12.8 oz)    Examination:  General exam: Appears calm and comfortable  HEENT: Mild R facial droop Respiratory system: Clear to auscultation. Respiratory effort normal. Cardiovascular system: S1 & S2 heard, RRR. No JVD, murmurs, rubs, gallops or clicks. No pedal edema. Gastrointestinal system: Abdomen is nondistended, soft and nontender. Normal bowel sounds heard. Central nervous system: R facial weakness, RUE and RLE 3/5  Extremities: Symmetric 5 x 5 power. Skin: No rashes, lesions or ulcers Psychiatry: Judgement and insight appear normal. Mood & affect appropriate.   Data Reviewed:   CBC:  Recent Labs Lab 10/01/16 1029 10/01/16 1830  WBC 8.7 9.1  NEUTROABS 6.4  --   HGB 16.1* 15.0  HCT 44.9 41.5  MCV 87.5 87.0  PLT 242 329   Basic Metabolic Panel:  Recent Labs Lab 10/01/16 1029 10/01/16 1830  NA 132*  --   K 3.8  --   CL 98*  --   CO2 23  --   GLUCOSE 390*  --   BUN 13  --   CREATININE 0.69 0.77  CALCIUM 9.2  --    GFR: Estimated Creatinine Clearance: 82.5 mL/min (by C-G formula based on SCr of 0.77 mg/dL). Liver Function  Tests:  Recent Labs Lab 10/01/16 1029  AST 27  ALT 43  ALKPHOS 79  BILITOT 1.0  PROT 7.3  ALBUMIN 3.8   No results for input(s): LIPASE, AMYLASE in the last 168 hours. No results for input(s): AMMONIA in the last 168 hours. Coagulation Profile:  Recent Labs Lab 10/01/16 1029  INR 0.93   Cardiac Enzymes: No results for input(s): CKTOTAL, CKMB, CKMBINDEX, TROPONINI in the last 168 hours. BNP (last 3 results) No results for input(s): PROBNP in the last 8760 hours. HbA1C: No results for input(s): HGBA1C in the last 72 hours. CBG:  Recent Labs Lab 10/01/16 1607 10/01/16 1904 10/01/16 2218 10/02/16 0755 10/02/16 1210  GLUCAP 319* 333* 390* 366* 360*   Lipid Profile: No results for input(s): CHOL, HDL, LDLCALC, TRIG, CHOLHDL,  LDLDIRECT in the last 72 hours. Thyroid Function Tests: No results for input(s): TSH, T4TOTAL, FREET4, T3FREE, THYROIDAB in the last 72 hours. Anemia Panel: No results for input(s): VITAMINB12, FOLATE, FERRITIN, TIBC, IRON, RETICCTPCT in the last 72 hours. Urine analysis:    Component Value Date/Time   COLORURINE STRAW (A) 10/01/2016 1029   APPEARANCEUR CLEAR 10/01/2016 1029   LABSPEC 1.027 10/01/2016 1029   PHURINE 5.0 10/01/2016 1029   GLUCOSEU >=500 (A) 10/01/2016 1029   HGBUR NEGATIVE 10/01/2016 1029   BILIRUBINUR NEGATIVE 10/01/2016 1029   KETONESUR 20 (A) 10/01/2016 1029   PROTEINUR NEGATIVE 10/01/2016 1029   UROBILINOGEN 0.2 12/13/2007 2148   NITRITE NEGATIVE 10/01/2016 1029   LEUKOCYTESUR NEGATIVE 10/01/2016 1029   Sepsis Labs: @LABRCNTIP (procalcitonin:4,lacticidven:4)  )No results found for this or any previous visit (from the past 240 hour(s)).       Radiology Studies: Ct Head Wo Contrast  Result Date: 10/01/2016 CLINICAL DATA:  Slurred speech since yesterday, unable to write with RIGHT hand, hypertension, diabetes mellitus EXAM: CT HEAD WITHOUT CONTRAST TECHNIQUE: Contiguous axial images were obtained from the base of the skull through the vertex without intravenous contrast. Sagittal and coronal MPR images reconstructed from axial data set. COMPARISON:  None FINDINGS: Brain: Normal ventricular morphology. No midline shift or mass effect. Minimal white matter hypoattenuation question chronic ischemic changes of deep cerebral white matter. Otherwise normal appearance of brain parenchyma. No intracranial hemorrhage, mass lesion, evidence of acute infarction, or extra-axial fluid collection. Vascular: Normal appearance Skull: Intact Sinuses/Orbits: Clear Other: N/A IMPRESSION: Question minimal small vessel chronic ischemic changes of deep cerebral white matter. No acute intracranial abnormalities. Electronically Signed   By: Lavonia Dana M.D.   On: 10/01/2016 11:32   Mr Brain  Wo Contrast  Result Date: 10/01/2016 CLINICAL DATA:  Patient presents with RIGHT arm difficulty for 2 days. Difficulty speaking. EXAM: MRI HEAD WITHOUT CONTRAST TECHNIQUE: Multiplanar, multiecho pulse sequences of the brain and surrounding structures were obtained without intravenous contrast. COMPARISON:  CT head 10/01/2016. FINDINGS: Brain: Restricted diffusion LEFT paramedian pons, consistent with acute infarction. No hemorrhage. The infarct is roughly 1 cm in size. No mass lesion, hydrocephalus or extra-axial fluid. Mild cerebral and cerebellar atrophy, premature for age. T2 and FLAIR hyperintensities in the periventricular and subcortical white matter, also moderately advanced, suggesting chronic microvascular ischemic change, likely secondary to hypertension and diabetes. Vascular: Normal flow voids. Skull and upper cervical spine: Normal marrow signal. Sinuses/Orbits: Negative. Other: None. Compared with earlier CT, the infarct is not visible. IMPRESSION: Acute nonhemorrhagic LEFT pontine brainstem infarct. Premature for age atrophy and chronic microvascular ischemic change. Electronically Signed   By: Staci Righter M.D.   On: 10/01/2016 13:01  Mr Jodene Nam Head/brain ZJ Cm  Result Date: 10/01/2016 CLINICAL DATA:  Right arm difficulty for 2 days. EXAM: MRA HEAD WITHOUT CONTRAST TECHNIQUE: Angiographic images of the Circle of Willis were obtained using MRA technique without intravenous contrast. COMPARISON:  Brain MRI from earlier today FINDINGS: Mildly larger right ICA, there is a large right posterior communicating artery. Flow artifact in the carotid siphons without suspected flow limiting stenosis. No major branch occlusion or proximal stenosis. Poor visualization of distal A1 segments is symmetric and likely artifactual from direction of flow. Negative for aneurysm. Symmetric vertebral arteries and branching. Moderate to advanced mid basilar narrowing with apparent linear defect crossing the lumen at the  level of the stenosis. No intramural hematoma seen on conventional MRI. 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 Atheromatous type irregularity of left PCA, greatest at the P3 branch. These results were called by telephone at the time of interpretation on 10/01/2016 at 7:43 pm to Dr. Myna Hidalgo, who verbally acknowledged these results. 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 This abnormality is at the level of the patient's acute pontine infarct. Electronically Signed   By: Monte Fantasia M.D.   On: 10/01/2016 19:44        Scheduled Meds: .  stroke: mapping our early stages of recovery book   Does not apply Once  . aspirin  300 mg Rectal Daily   Or  . aspirin  325 mg Oral Daily  . enoxaparin (LOVENOX) injection  40 mg Subcutaneous Q24H  . feeding supplement (ENSURE ENLIVE)  237 mL Oral BID BM  . insulin aspart  0-5 Units Subcutaneous QHS  . insulin aspart  0-9 Units Subcutaneous TID WC  . insulin aspart  3 Units Subcutaneous TID WC  . insulin glargine  20 Units Subcutaneous QHS  . multivitamin with minerals  1 tablet Oral Daily  . pravastatin  40 mg Oral q1800   Continuous Infusions: . sodium chloride 75 mL/hr at 10/02/16 1303     LOS: 0 days    Time spent: 14min    Domenic Polite, MD [facility] Pager [phone]  If 7PM-7AM, please contact night-coverage [email or web address] Password [email or web address] 10/02/2016, 1:39 PM

## 2016-10-02 NOTE — Evaluation (Addendum)
Occupational Therapy Evaluation [name] MRN: [id number] DOB: [birth year] Today's Date: 10/02/2016    History of Present Illness Pt is a 51 y.o. female who was transferred from [facility] to [facility] for evaluation of stroke. She presented with slurred speech and R sided weakness with R facial droop. MRI revealed acute stroke in L pons. Pt with PMH signficant for: back pain, diabetes mellitus without complication, fatty liver, gallstones, hypertension, obesity, and perimenopausal symptoms.   Clinical Impression   PTA, pt was independent with ADL and functional mobility. Pt presents with decreased R UE strength and coordination impacting ability to participate in ADL at PLOF. She requires min assist for dressing tasks at this time. Pt demonstrates decreased in-hand manipulation and fine motor coordination skills required for work related tasks. Initiated R UE strengthening and fine motor coordination HEP with handout provided. Pt would benefit from continued OT services while admitted to improve independence with ADL and work tasks. Discussed with PT and recommend CIR placement post-acute D/C in order to maximize return to PLOF. OT will continue to follow while admitted.    Follow Up Recommendations  CIR;Supervision/Assistance - 24 hour    Equipment Recommendations  None recommended by OT    Recommendations for Other Services       Precautions / Restrictions Precautions Precautions: Fall Precaution Comments: R hemi Restrictions Weight Bearing Restrictions: No      Mobility Bed Mobility Overal bed mobility: Needs Assistance Bed Mobility: Supine to Sit     Supine to sit: Supervision     General bed mobility comments: Increased time and effort with use of bed rails.  Transfers                 General transfer comment: unable to test due to IV team arriving    Balance Overall balance assessment: Needs  assistance Sitting-balance support: Feet supported;No upper extremity supported Sitting balance-Leahy Scale: Good                                     ADL either performed or assessed with clinical judgement   ADL Overall ADL's : Needs assistance/impaired Eating/Feeding: Set up;Sitting   Grooming: Minimal assistance;Sitting   Upper Body Bathing: Minimal assistance;Sitting   Lower Body Bathing: Min guard;Sit to/from stand   Upper Body Dressing : Minimal assistance;Sitting   Lower Body Dressing: Min guard;Sit to/from Health and safety inspector Details (indicate cue type and reason): unable to test due to IV team arriving           General ADL Comments: Pt demonstrating decreased R UE strength and coordination. Difficulty writing and completing in hand manipulation tasks directly related to work tasks. Sensation in tact.     Vision Patient Visual Report: No change from baseline Additional Comments: Pt reporting no visual changes and was able to utilize vision functionally.     Perception     Praxis      Pertinent Vitals/Pain Pain Assessment: No/denies pain     Hand Dominance Right   Extremity/Trunk Assessment Upper Extremity Assessment Upper Extremity Assessment: RUE deficits/detail RUE Deficits / Details: Decreased strength: shoulder 4/5, elbow flexion/extension 3/5, grasp 3-/5. Decreased fine motor coordination. Hand brunnstrom level V. RUE Coordination: decreased fine motor;decreased gross motor   Lower Extremity Assessment Lower Extremity Assessment: Defer to PT evaluation       Communication Communication Communication:  Expressive difficulties (slightly slurred)   Cognition Arousal/Alertness: Awake/alert Behavior During Therapy: WFL for tasks assessed/performed Overall Cognitive Status: Within Functional Limits for tasks assessed                                     General Comments       Exercises Exercises: Other  exercises Other Exercises Other Exercises: Instructed pt on R UE AROM against gravity in all planes with handout provided. Other Exercises: Educated pt on fine motor coordination HEP initiating composite digit flexion/extension, finger abduction/adduction. Will progress this next session as education limited due to IV team arriving.  Other Exercises: Provided yellow therapy putty to address grasp strength.    Shoulder Instructions      Home Living Family/patient expects to be discharged to:: Private residence Living Arrangements: Spouse/significant other;Children Available Help at Discharge: Family Type of Home: House Home Access: Stairs to enter Technical brewer of Steps: 5 Entrance Stairs-Rails: Can reach both;Right;Left Home Layout: One level     Bathroom Shower/Tub: [place]: None (may be able to get shower seat from sister)          Prior Functioning/Environment Level of Independence: Independent        Comments: Works doing office work for [employer]        OT Problem List: Decreased strength;Decreased range of motion;Decreased activity tolerance;Impaired balance (sitting and/or standing);Decreased coordination;Decreased safety awareness;Decreased knowledge of use of DME or AE;Decreased knowledge of precautions;Impaired UE functional use      OT Treatment/Interventions: Self-care/ADL training;Therapeutic exercise;Energy conservation;DME and/or AE instruction;Therapeutic activities;Patient/family education;Balance training    OT Goals(Current goals can be found in the care plan section) Acute Rehab OT Goals Patient Stated Goal: be able to use R hand OT Goal Formulation: With patient Potential to Achieve Goals: Good ADL Goals Pt Will Perform Grooming: with modified independence;standing Pt Will Perform Upper Body Dressing: with modified independence;sitting;with adaptive equipment Pt Will Perform Lower Body Dressing: with modified  independence;with adaptive equipment;sit to/from stand Pt Will Transfer to Toilet: with modified independence;ambulating;regular height toilet Pt/caregiver will Perform Home Exercise Program: Right Upper extremity;With theraputty;With written HEP provided (R UE strengthening, R hand fine motor coordination) Additional ADL Goal #1: Pt will demonstrate improved fine motor coordination to complete a variety of fasteners for dressing tasks. Additional ADL Goal #2: Pt will improve ability to isolate digits in order to type one full sentence to improve independence with work related tasks.  OT Frequency: Min 3X/week   Barriers to D/C:            Co-evaluation              AM-PAC PT "6 Clicks" Daily Activity     Outcome Measure Help from another person eating meals?: None Help from another person taking care of personal grooming?: A Little Help from another person toileting, which includes using toliet, bedpan, or urinal?: A Little Help from another person bathing (including washing, rinsing, drying)?: A Little Help from another person to put on and taking off regular upper body clothing?: A Little Help from another person to put on and taking off regular lower body clothing?: A Little 6 Click Score: 19   End of Session    Activity Tolerance: Patient tolerated treatment well Patient left: in bed;with call bell/phone within reach;with nursing/sitter in room;with family/visitor present (with IV team placing new IV)  OT Visit Diagnosis: Hemiplegia and hemiparesis Hemiplegia - Right/Left: Right Hemiplegia - dominant/non-dominant: Dominant Hemiplegia - caused by: Cerebral infarction                Time: 1100-1136 OT Time Calculation (min): 36 min Charges:  OT General Charges $OT Visit: 1 Procedure OT Evaluation $OT Eval Moderate Complexity: 1 Procedure OT Treatments $Self Care/Home Management : 8-22 mins G-Codes: OT G-codes **NOT FOR INPATIENT CLASS** Functional Assessment Tool  Used: Clinical judgement Functional Limitation: Self care Self Care Current Status (H8850): At least 1 percent but less than 20 percent impaired, limited or restricted Self Care Goal Status (Y7741): At least 1 percent but less than 20 percent impaired, limited or restricted   Norman Herrlich, [place] OTR/L  Pager: [place] 10/02/2016, 12:18 PM

## 2016-10-02 NOTE — Progress Notes (Signed)
Patient off unit via stretcher by EMS. Patient was tearful, stating that her right arm felt heavier. Patient's spouse with patient. Questions answered. Paperwork sent with EMT and belongings sent with spouse. Denies needs at this time.

## 2016-10-02 NOTE — Evaluation (Signed)
Physical Therapy Evaluation [name] MRN: [id number] DOB: 1965/07/04 Today's Date: 10/02/2016   History of Present Illness  Pt is a 51 y.o. female who was transferred from [facility] to [facility] for evaluation of stroke. She presented with slurred speech and R sided weakness with R facial droop. MRI revealed acute stroke in L pons. Pt with PMH signficant for: back pain, diabetes mellitus without complication, fatty liver, gallstones, hypertension, obesity, and perimenopausal symptoms.     Clinical Impression  Patient presents with problems listed below.  Will benefit from acute PT to maximize functional mobility prior to discharge.  Patient was independent/working pta.  Today required mod assist for ambulation due to Rt hemiparesis.  Recommend Inpatient Rehab consult with goal to return patient to optimal functional level to return home with family.    Follow Up Recommendations CIR;Supervision/Assistance - 24 hour    Equipment Recommendations  Other (comment) (TBD)    Recommendations for Other Services Rehab consult     Precautions / Restrictions Precautions Precautions: Fall Precaution Comments: R hemi Restrictions Weight Bearing Restrictions: No      Mobility  Bed Mobility Overal bed mobility: Needs Assistance Bed Mobility: Supine to Sit;Sit to Supine     Supine to sit: Supervision Sit to supine: Min assist   General bed mobility comments: Increased time and effort with use of bed rails.  Assist to bring LE's onto bed.  Transfers Overall transfer level: Needs assistance Equipment used:  (Holding bedside table for stability) Transfers: Sit to/from Stand Sit to Stand: Min assist         General transfer comment: Verbal cues to push up from bed rather than reach for furniture.  Assist to steady during transfer to standing.  Ambulation/Gait Ambulation/Gait assistance: Mod assist Ambulation Distance (Feet):  20 Feet Assistive device: 1 person hand held assist Gait Pattern/deviations: Step-to pattern;Decreased stance time - right;Decreased step length - left;Decreased stride length;Decreased dorsiflexion - right;Decreased weight shift to right;Shuffle;Trunk flexed Gait velocity: decreased Gait velocity interpretation: Below normal speed for age/gender General Gait Details: Patient with decreased Rt knee control in stance, with knee in fixed extended position.  Knee buckles in stance if not locked into extension.  Gait unsteady requiring mod assist for balance/safety.  Stairs            Wheelchair Mobility    Modified Rankin (Stroke Patients Only) Modified Rankin (Stroke Patients Only) Pre-Morbid Rankin Score: No symptoms Modified Rankin: Moderately severe disability     Balance Overall balance assessment: Needs assistance Sitting-balance support: Feet supported;No upper extremity supported Sitting balance-Leahy Scale: Good     Standing balance support: Single extremity supported;During functional activity Standing balance-Leahy Scale: Poor                               Pertinent Vitals/Pain Pain Assessment: No/denies pain    Home Living Family/patient expects to be discharged to:: Private residence Living Arrangements: Spouse/significant other;Children Available Help at Discharge: Family;Available 24 hours/day Type of Home: House Home Access: Stairs to enter Entrance Stairs-Rails: Can reach both;Right;Left Entrance Stairs-Number of Steps: 5 Home Layout: One level Home Equipment: None      Prior Function Level of Independence: Independent         Comments: Drives. Works full time doing office work for [employer].     Hand Dominance   Dominant Hand: Right    Extremity/Trunk Assessment   Upper Extremity  Assessment Upper Extremity Assessment: Defer to OT evaluation    Lower Extremity Assessment Lower Extremity Assessment: RLE deficits/detail RLE  Deficits / Details: Hip flexion, knee flex/ext and DF at 4-/5 RLE Coordination: decreased gross motor       Communication   Communication: Expressive difficulties  Cognition Arousal/Alertness: Awake/alert Behavior During Therapy: WFL for tasks assessed/performed Overall Cognitive Status: Within Functional Limits for tasks assessed                                        General Comments      Exercises Other Exercises Other Exercises: Shallow squats trying to keep weight even on both feet. Other Exercises: Shallow squats with RLE posterior to LLE. Other Exercises: Weight shifting and stepping with LLE trying to maintain control of RLE.   Assessment/Plan    PT Assessment Patient needs continued PT services  PT Problem List Decreased strength;Decreased activity tolerance;Decreased balance;Decreased mobility;Decreased coordination;Decreased knowledge of use of DME       PT Treatment Interventions DME instruction;Gait training;Stair training;Functional mobility training;Therapeutic activities;Balance training;Neuromuscular re-education;Patient/family education    PT Goals (Current goals can be found in the Care Plan section)  Acute Rehab PT Goals Patient Stated Goal: Be able to walk PT Goal Formulation: With patient/family Time For Goal Achievement: 10/09/16 Potential to Achieve Goals: Good    Frequency Min 4X/week   Barriers to discharge        Co-evaluation               AM-PAC PT "6 Clicks" Daily Activity  Outcome Measure Difficulty turning over in bed (including adjusting bedclothes, sheets and blankets)?: Total Difficulty moving from lying on back to sitting on the side of the bed? : Total Difficulty sitting down on and standing up from a chair with arms (e.g., wheelchair, bedside commode, etc,.)?: Total Help needed moving to and from a bed to chair (including a wheelchair)?: A Little Help needed walking in hospital room?: A Lot Help needed  climbing 3-5 steps with a railing? : A Lot 6 Click Score: 10    End of Session Equipment Utilized During Treatment: Gait belt Activity Tolerance: Patient limited by fatigue Patient left: in bed;with call bell/phone within reach;with bed alarm set;with family/visitor present Nurse Communication: Mobility status PT Visit Diagnosis: Unsteadiness on feet (R26.81);Other abnormalities of gait and mobility (R26.89);Hemiplegia and hemiparesis Hemiplegia - Right/Left: Right Hemiplegia - dominant/non-dominant: Dominant Hemiplegia - caused by: Cerebral infarction    Time: 1219-7588 PT Time Calculation (min) (ACUTE ONLY): 18 min   Charges:   PT Evaluation $PT Eval Moderate Complexity: 1 Procedure     PT G Codes:   PT G-Codes **NOT FOR INPATIENT CLASS** Functional Assessment Tool Used: AM-PAC 6 Clicks Basic Mobility;Clinical judgement Functional Limitation: Mobility: Walking and moving around Mobility: Walking and Moving Around Current Status (T2549): At least 60 percent but less than 80 percent impaired, limited or restricted Mobility: Walking and Moving Around Goal Status [phone]): At least 20 percent but less than 40 percent impaired, limited or restricted    Carita Pian. Rosana Hoes PT, [facility] Acute Rehab Services Pager [phone] .  Despina Pole 10/02/2016, 4:40 PM

## 2016-10-02 NOTE — Progress Notes (Signed)
Received report from AP-300 RN, Elmyra Ricks.

## 2016-10-03 ENCOUNTER — Inpatient Hospital Stay (HOSPITAL_COMMUNITY): Payer: BC Managed Care – PPO

## 2016-10-03 ENCOUNTER — Encounter (HOSPITAL_COMMUNITY): Payer: Self-pay

## 2016-10-03 DIAGNOSIS — E782 Mixed hyperlipidemia: Secondary | ICD-10-CM

## 2016-10-03 DIAGNOSIS — E1149 Type 2 diabetes mellitus with other diabetic neurological complication: Secondary | ICD-10-CM

## 2016-10-03 DIAGNOSIS — I10 Essential (primary) hypertension: Secondary | ICD-10-CM

## 2016-10-03 DIAGNOSIS — R739 Hyperglycemia, unspecified: Secondary | ICD-10-CM

## 2016-10-03 DIAGNOSIS — I6350 Cerebral infarction due to unspecified occlusion or stenosis of unspecified cerebral artery: Secondary | ICD-10-CM

## 2016-10-03 DIAGNOSIS — I639 Cerebral infarction, unspecified: Secondary | ICD-10-CM

## 2016-10-03 DIAGNOSIS — I651 Occlusion and stenosis of basilar artery: Secondary | ICD-10-CM

## 2016-10-03 DIAGNOSIS — I5032 Chronic diastolic (congestive) heart failure: Secondary | ICD-10-CM

## 2016-10-03 LAB — GLUCOSE, CAPILLARY
GLUCOSE-CAPILLARY: 339 mg/dL — AB (ref 65–99)
Glucose-Capillary: 327 mg/dL — ABNORMAL HIGH (ref 65–99)
Glucose-Capillary: 278 mg/dL — ABNORMAL HIGH (ref 65–99)
Glucose-Capillary: 299 mg/dL — ABNORMAL HIGH (ref 65–99)

## 2016-10-03 LAB — ECHOCARDIOGRAM COMPLETE
HEIGHTINCHES: 63 in
WEIGHTICAEL: 2764.8 [oz_av]

## 2016-10-03 MED ORDER — INSULIN ASPART 100 UNIT/ML ~~LOC~~ SOLN
5.0000 [IU] | Freq: Three times a day (TID) | SUBCUTANEOUS | Status: DC
  Administered 2016-10-03 – 2016-10-04 (×4): 5 [IU] via SUBCUTANEOUS

## 2016-10-03 MED ORDER — CYCLOBENZAPRINE HCL 5 MG PO TABS: 5.0000 mg | ORAL_TABLET | Freq: Three times a day (TID) | ORAL | Status: DC | PRN

## 2016-10-03 MED ORDER — INSULIN GLARGINE 100 UNIT/ML ~~LOC~~ SOLN
30.0000 [IU] | Freq: Every day | SUBCUTANEOUS | Status: DC
  Administered 2016-10-03: 30 [IU] via SUBCUTANEOUS
  Filled 2016-10-03 (×2): qty 0.3

## 2016-10-03 MED ORDER — ATORVASTATIN CALCIUM 80 MG PO TABS
80.0000 mg | ORAL_TABLET | Freq: Every day | ORAL | Status: DC
  Administered 2016-10-03: 80 mg via ORAL
  Filled 2016-10-03: qty 1

## 2016-10-03 NOTE — Progress Notes (Signed)
Occupational Therapy Treatment [name] MRN: [id number] DOB: 1966/01/13 Today's Date: 10/03/2016    History of present illness Pt is a 51 y.o. female who was transferred from [facility] to [facility] for evaluation of stroke. She presented with slurred speech and R sided weakness with R facial droop. MRI revealed acute stroke in L pons. Pt with PMH signficant for: back pain, diabetes mellitus without complication, fatty liver, gallstones, hypertension, obesity, and perimenopausal symptoms.   OT comments  Pt progressing well toward OT goals. Progressed fine motor coordination activities to improve functional use of R UE for work tasks with education provided concerning digit lifts, thumb circles, and opposition. Additionally educated pt and family on R UE positioning to promote functional use. Pt able to isolate index finger briefly. Pt able to complete HEP with supervision and VC's for technique. She reports ability to use R hand to assist with feeding tasks earlier this morning and has been writing with improved legibility. Numbness and tingling have developed throughout R UE. D/C plan remains appropriate.    Follow Up Recommendations  Supervision/Assistance - 24 hour;CIR    Equipment Recommendations  None recommended by OT    Recommendations for Other Services Rehab consult    Precautions / Restrictions Precautions Precautions: Fall Precaution Comments: R hemi Restrictions Weight Bearing Restrictions: No       Mobility Bed Mobility                  Transfers                      Balance                                           ADL either performed or assessed with clinical judgement   ADL Overall ADL's : Needs assistance/impaired Eating/Feeding: Set up;Sitting Eating/Feeding Details (indicate cue type and reason): Pt reports using R hand to feed self with assistance from L hand.                                    General ADL Comments: Session focused on R UE strengthening and fine motor coordination for continued education. Educated pt and family concerning position of hand with digits extended in order to facilitate functional positioning for ADL. Pt reports practicing writing and coloring today. Educated pt on importance of incorporating R UE into ADL participation at least as a functional assist.     Vision   Additional Comments: Able to read appropriately.   Perception     Praxis      Cognition Arousal/Alertness: Awake/alert Behavior During Therapy: WFL for tasks assessed/performed Overall Cognitive Status: Within Functional Limits for tasks assessed                                          Exercises Exercises: Other exercises Other Exercises Other Exercises: Pt instructed in R UE AROM against gravity to continue improvement of functional use of R UE. Difficulty with R wrist extension against gravity and adjusted to gravity eliminated position to facilitate full AROM. Other Exercises: Progressed fine motor coordination HEP with digit lifts, thumb circles, and opposition.  Shoulder Instructions       General Comments      Pertinent Vitals/ Pain       Pain Assessment: No/denies pain  Home Living                                          Prior Functioning/Environment              Frequency  Min 3X/week        Progress Toward Goals  OT Goals(current goals can now be found in the care plan section)  Progress towards OT goals: Progressing toward goals  Acute Rehab OT Goals Patient Stated Goal: Be able to walk OT Goal Formulation: With patient Potential to Achieve Goals: Good ADL Goals Pt Will Perform Grooming: with modified independence;standing Pt Will Perform Upper Body Dressing: with modified independence;sitting;with adaptive equipment Pt Will Perform Lower Body Dressing: with  modified independence;with adaptive equipment;sit to/from stand Pt Will Transfer to Toilet: with modified independence;ambulating;regular height toilet Pt/caregiver will Perform Home Exercise Program: Right Upper extremity;With theraputty;With written HEP provided (R UE strengthening, R hand fine motor coordination) Additional ADL Goal #1: Pt will demonstrate improved fine motor coordination to complete a variety of fasteners for dressing tasks. Additional ADL Goal #2: Pt will improve ability to isolate digits in order to type one full sentence to improve independence with work related tasks.  Plan Discharge plan remains appropriate    Co-evaluation                 AM-PAC PT "6 Clicks" Daily Activity     Outcome Measure   Help from another person eating meals?: None Help from another person taking care of personal grooming?: A Little Help from another person toileting, which includes using toliet, bedpan, or urinal?: A Little Help from another person bathing (including washing, rinsing, drying)?: A Little Help from another person to put on and taking off regular upper body clothing?: A Little Help from another person to put on and taking off regular lower body clothing?: A Little 6 Click Score: 19    End of Session    OT Visit Diagnosis: Hemiplegia and hemiparesis Hemiplegia - Right/Left: Right Hemiplegia - dominant/non-dominant: Dominant Hemiplegia - caused by: Cerebral infarction   Activity Tolerance Patient tolerated treatment well   Patient Left in bed;with call bell/phone within reach;with family/visitor present   Nurse Communication          Time: 3614-4315 OT Time Calculation (min): 13 min  Charges: OT General Charges $OT Visit: 1 Procedure OT Treatments $Therapeutic Exercise: 8-22 mins  Norman Herrlich, MS OTR/L  Pager: Robinson A Pandora Mccrackin 10/03/2016, 5:01 PM

## 2016-10-03 NOTE — Progress Notes (Signed)
STROKE TEAM PROGRESS NOTE   HISTORY OF PRESENT ILLNESS (per record) This is a 51-yo RH woman who is transferred from [facility] for evaluation of stroke. She reports that she developed slurred speech on 09/30/16, describing stumbling over some of her words. This was accompanied by a sense of heaviness and weakness in the R arm and leg as well as some R facial droop. She denies any vision loss, double vision, difficulty swallowing, numbness/tingling, or coordination issues. She states that she has to hold on to something in order to walk due to the weakness in her R leg but has not fallen. She presented to [facility] where MRI brain showed an acute stroke in the L pons. 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   She has received aspirin and atorvastatin during this admission. She was not taking any antiplatelets or statins at home.   Last known well: 09/30/16 NHISS score: 2 mRS score: 0 tPA given?: No, out of window with completed stroke on MRI   SUBJECTIVE (INTERVAL HISTORY) Her husband and parents are at the bedside.  Pt still has right facial droop and right hemiparesis. Pt and husband admitted that pt BP and glucose at home not in good control.    OBJECTIVE Temp:  [97.9 F (36.6 C)-98.1 F (36.7 C)] 98.1 F (36.7 C) (05/06 0451) Pulse Rate:  [75-85] 75 (05/06 0451) Cardiac Rhythm: Sinus tachycardia (05/06 0705) Resp:  [16-18] 18 (05/06 0451) BP: (167-173)/(68-78) 167/76 (05/06 0451) SpO2:  [94 %-98 %] 97 % (05/06 0451)  CBC:   Recent Labs Lab 10/01/16 1029 10/01/16 1830  WBC 8.7 9.1  NEUTROABS 6.4  --   HGB 16.1* 15.0  HCT 44.9 41.5  MCV 87.5 87.0  PLT 242 638    Basic Metabolic Panel:   Recent Labs Lab 10/01/16 1029 10/01/16 1830  NA 132*  --    K 3.8  --   CL 98*  --   CO2 23  --   GLUCOSE 390*  --   BUN 13  --   CREATININE 0.69 0.77  CALCIUM 9.2  --     Lipid Panel:     Component Value Date/Time   CHOL 267 (H) 10/02/2016 1234   TRIG 660 (H) 10/02/2016 1234   HDL 25 (L) 10/02/2016 1234   CHOLHDL 10.7 10/02/2016 1234   VLDL UNABLE TO CALCULATE IF TRIGLYCERIDE OVER 400 mg/dL 10/02/2016 1234   LDLCALC UNABLE TO CALCULATE IF TRIGLYCERIDE OVER 400 mg/dL 10/02/2016 1234   HgbA1c:  Lab Results  Component Value Date   HGBA1C 12.8 (H) 07/30/2013   Urine Drug Screen:     Component Value Date/Time   LABOPIA NONE DETECTED 10/01/2016 1029   COCAINSCRNUR NONE DETECTED 10/01/2016 1029   LABBENZ NONE DETECTED 10/01/2016 1029   AMPHETMU NONE DETECTED 10/01/2016 1029   THCU NONE DETECTED 10/01/2016 1029   LABBARB NONE DETECTED 10/01/2016 1029    Alcohol Level No results found for: Wilson I have personally reviewed the radiological images below and agree with the radiology interpretations.  Ct Angio Head W Or Wo Contrast Ct Angio Neck W Or Wo Contrast 10/02/2016 1. Severe mid basilar stenosis. No visible flap or ulceration to correlate with luminal irregularity on MRA yesterday.  2. Intracranial atheromatous irregularity including at least moderate left P2 segment stenosis.  3. Mild atherosclerosis at the cervical carotid bifurcations. No stenosis in the neck.   Ct Head Wo Contrast 10/01/2016 Question minimal small vessel chronic ischemic changes of deep cerebral white matter. No acute intracranial abnormalities.   Mr Brain Wo Contrast 10/01/2016 Acute nonhemorrhagic LEFT pontine brainstem infarct. Premature for age atrophy and chronic microvascular ischemic change.   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 This abnormality is at the level of the patient's acute pontine infarct.   TTE  pending   PHYSICAL EXAM  Temp:  [97.9 F (36.6 C)-98.1 F (36.7 C)] 98.1 F (36.7 C) (05/06 0451) Pulse Rate:  [75-85] 75 (05/06 0451) Resp:  [16-18] 18 (05/06 0451) BP: (167-173)/(68-78) 167/76 (05/06 0451) SpO2:  [94 %-98 %] 97 % (05/06 0451)  General - Well nourished, well developed, in no apparent distress.  Ophthalmologic - Sharp disc margins OU.   Cardiovascular - Regular rate and rhythm.  Mental Status -  Level of arousal and orientation to time, place, and person were intact. Language including expression, naming, repetition, comprehension was assessed and found intact. Attention span and concentration were normal. Fund of Knowledge was assessed and was intact.  Cranial Nerves II - XII - II - Visual field intact OU. III, IV, VI - Extraocular movements intact. V - Facial sensation intact bilaterally. VII - right facial droop. VIII - Hearing & vestibular intact bilaterally. X - Palate elevates symmetrically. XI - Chin turning & shoulder shrug intact bilaterally. XII - Tongue protrusion intact.  Motor Strength - The patient's strength was normal in LUE and LLE, 4/5 RUE and RLE, and pronator drift was present on the right.  Bulk was normal and fasciculations were absent.   Motor Tone - Muscle tone was assessed at the neck and appendages and was normal.  Reflexes - The patient's reflexes were 1+ in all extremities and she had no pathological reflexes.  Sensory - Light touch, temperature/pinprick were assessed and were symmetrical.    Coordination - right FTN ataxic proportional to the weakness.  Tremor was absent.  Gait and Station - deferred.   ASSESSMENT/PLAN Ms. ZAHRAH SUTHERLIN is a 51 y.o. female with history of obesity, hypertension, diabetes mellitus, chronic back pain, and perimenopausal symptoms presenting with right-sided weakness. She did not receive IV t-PA due to late presentation.   Stroke:  Left pontine brainstem infarct - possibly large vessel  disease secondary to BA stenosis.  Resultant  Right facial droop and right hemiparesis  MRI - acute LEFT pontine infarct.  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  CTA head and neck - BA severe stenosis, no dissection  2D Echo - pending  LDL - unable to calculate secondary to elevated TG 660, total chol 267  HgbA1c -  pending  VTE prophylaxis - Lovenox Diet heart healthy/carb modified Room service appropriate? Yes; Fluid consistency: Thin  No antithrombotic prior to admission, now on aspirin 325 mg daily and clopidogrel 75 mg daily. Continue DAPT for 3 months and then plavix alone due to intracranial stenosis  Patient counseled to be compliant with her antithrombotic medications  Ongoing aggressive stroke risk factor management  Therapy recommendations:  CIR  Disposition: Pending  BA stenosis  Likely due to uncontrolled HTN and DM and HLD  CTA head severe BA stenosis  On DAPT and high dose statin  If recurrent stroke under maximized therapy,  can consider BA stenting  Hypertension  High at home  Permissive hypertension (OK if < 220/120) but gradually normalize in 5-7 days  Long-term BP goal 130-150 due to BA stenosis  Hyperlipidemia  Home meds:  Mevacor 40 mg daily prior to admission   LDL could not be calculated, goal < 70  Now on Lipitor 80 mg daily  Continue statin at discharge  Diabetes  HgbA1c pending, goal < 7.0  Uncontrolled  Hyperglycemia at home  On lantus and premeal insulin    Other Stroke Risk Factors  Obesity, Body mass index is 30.61 kg/m., recommend weight loss, diet and exercise as appropriate   Other Active Problems  Elevated triglycerides - [facility] day # 1  Neurology will sign off. Please call with questions. Pt will follow up with Dr. Cecille Rubin NP at [facility] in about 6 weeks. Thanks for the consult.  Rosalin Hawking, MD PhD Stroke  Neurology 10/03/2016 12:21 PM   To contact Stroke Continuity provider, please refer to [email or web address] After hours, contact General Neurology

## 2016-10-03 NOTE — Progress Notes (Signed)
Rehab Admissions Coordinator Note:  Patient was screened by Retta Diones for appropriateness for an Inpatient Acute Rehab Consult.  At this time, we are recommending Inpatient Rehab consult.  Jodell Cipro M 10/03/2016, 8:06 AM  I can be reached at [phone].

## 2016-10-03 NOTE — Progress Notes (Signed)
PROGRESS NOTE    Shelley Frazier  NWG:[id number] DOB: 1966-01-04 DOA: 10/01/2016 PCP: Jani Gravel, MD  Brief Narrative:This is a 57-yo RH woman who is transferred from [facility] for evaluation of stroke. She reports that she developed slurred speech on 09/30/16, with  weakness in the R arm and leg as well as some R facial droop. She presented to [facility] where MRI brain showed an acute stroke in the L pons. 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 -hold lisinopril to allow for permissible HTN  DM II -FU A1C, hold amaryl -CBGS in 300s, started lantus, increase dose, add novolog meal coverage  -DM coordinator consult  DVT prophylaxis: lovenox  Code Status: full code  Family Communication: spouse at bedside Disposition Plan: CIR tomorrow if [place] Consultants:   Neuro   Subjective: R sided weakness unchanged, no new symptoms  Objective: Vitals:   10/02/16 0615 10/02/16 1544 10/02/16 2113 10/03/16 0451  BP: (!) 155/87 (!) 173/78 (!) 173/68 (!) 167/76  Pulse: 84 85 80 75  Resp: 18 16 18 18   Temp: 98.3 F (36.8 C) 97.9 F (36.6 C) 97.9 F (36.6 C) 98.1 F (36.7 C)  TempSrc: Oral Oral    SpO2: 99% 98% 94% 97%  Weight:      Height:        Intake/Output Summary (Last 24 hours) at 10/03/16 1130 Last data filed at  10/02/16 2045  Gross per 24 hour  Intake              240 ml  Output                0 ml  Net              240 ml   Filed Weights   10/01/16 0951 10/01/16 1747  Weight: 79.8 kg (176 lb) 78.4 kg (172 lb 12.8 oz)    Examination:  General exam: AAOx3  HEENT: Mild R facial droop, less pronounced Respiratory system: CTAB Cardiovascular system: S1 & S2 heard, RRR. No JVD, murmurs Gastrointestinal system: Abdomen is nondistended, soft and nontender. Normal bowel sounds heard. Central nervous system: R facial weakness, RUE and RLE 3/5  Extremities: Symmetric 5 x 5 power. Skin: No rashes, lesions or ulcers Psychiatry: Judgement and insight appear normal. Mood & affect appropriate.   Data Reviewed:   CBC:  Recent Labs Lab 10/01/16 1029 10/01/16 1830  WBC 8.7 9.1  NEUTROABS 6.4  --   HGB 16.1* 15.0  HCT 44.9 41.5  MCV 87.5 87.0  PLT 242 578   Basic Metabolic Panel:  Recent Labs Lab 10/01/16 1029 10/01/16 1830  NA 132*  --   K 3.8  --   CL 98*  --   CO2 23  --   GLUCOSE 390*  --   BUN 13  --   CREATININE 0.69 0.77  CALCIUM 9.2  --    GFR: Estimated Creatinine Clearance: 82.5 mL/min (by  C-G formula based on SCr of 0.77 mg/dL). Liver Function Tests:  Recent Labs Lab 10/01/16 1029  AST 27  ALT 43  ALKPHOS 79  BILITOT 1.0  PROT 7.3  ALBUMIN 3.8   No results for input(s): LIPASE, AMYLASE in the last 168 hours. No results for input(s): AMMONIA in the last 168 hours. Coagulation Profile:  Recent Labs Lab 10/01/16 1029  INR 0.93   Cardiac Enzymes: No results for input(s): CKTOTAL, CKMB, CKMBINDEX, TROPONINI in the last 168 hours. BNP (last 3 results) No results for input(s): PROBNP in the last 8760 hours. HbA1C: No results for input(s): HGBA1C in the last 72 hours. CBG:  Recent Labs Lab 10/02/16 0755 10/02/16 1210 10/02/16 1653 10/02/16 2111 10/03/16 0803  GLUCAP 366* 360* 303* 210* 339*   Lipid Profile:  Recent Labs  10/02/16 1234  CHOL  267*  HDL 25*  LDLCALC UNABLE TO CALCULATE IF TRIGLYCERIDE OVER 400 mg/dL  TRIG 660*  CHOLHDL 10.7   Thyroid Function Tests: No results for input(s): TSH, T4TOTAL, FREET4, T3FREE, THYROIDAB in the last 72 hours. Anemia Panel: No results for input(s): VITAMINB12, FOLATE, FERRITIN, TIBC, IRON, RETICCTPCT in the last 72 hours. Urine analysis:    Component Value Date/Time   COLORURINE STRAW (A) 10/01/2016 1029   APPEARANCEUR CLEAR 10/01/2016 1029   LABSPEC 1.027 10/01/2016 1029   PHURINE 5.0 10/01/2016 1029   GLUCOSEU >=500 (A) 10/01/2016 1029   HGBUR NEGATIVE 10/01/2016 1029   BILIRUBINUR NEGATIVE 10/01/2016 1029   KETONESUR 20 (A) 10/01/2016 1029   PROTEINUR NEGATIVE 10/01/2016 1029   UROBILINOGEN 0.2 12/13/2007 2148   NITRITE NEGATIVE 10/01/2016 1029   LEUKOCYTESUR NEGATIVE 10/01/2016 1029   Sepsis Labs: @LABRCNTIP (procalcitonin:4,lacticidven:4)  )No results found for this or any previous visit (from the past 240 hour(s)).       Radiology Studies: Ct Angio Head W Or Wo Contrast  Result Date: 10/02/2016 CLINICAL DATA:  Acute ischemic stroke.  Abnormal MRA. EXAM: CT ANGIOGRAPHY HEAD AND NECK TECHNIQUE: Multidetector CT imaging of the head and neck was performed using the standard protocol during bolus administration of intravenous contrast. Multiplanar CT image reconstructions and MIPs were obtained to evaluate the vascular anatomy. Carotid stenosis measurements (when applicable) are obtained utilizing NASCET criteria, using the distal internal carotid diameter as the denominator. CONTRAST:  50 cc Isovue 370 intravenous COMPARISON:  Brain MRI and MRA from yesterday FINDINGS: CT HEAD FINDINGS Brain: The known acute left pontine infarct is largely obscured by streak artifact. No evidence of interval infarct or hemorrhage. No hydrocephalus. Vascular: See below Skull: No acute or aggressive finding Sinuses: Negative Orbits: Negative Review of the MIP images confirms the above findings  CTA NECK FINDINGS Aortic arch: Unremarkable.  Three vessel branching. Right carotid system: Mild atheromatous wall thickening at the common carotid bifurcation, mixed density. No stenosis or ulceration noted. Negative for dissection or beading. Left carotid system: Mild calcified and noncalcified atheromatous wall thickening at the common carotid bifurcation without stenosis, ulceration, or beading. Vertebral arteries: No proximal subclavian stenosis. Codominant vertebral arteries that are smooth and widely patent to the dura. Skeleton: No acute or aggressive finding Other neck: Negative Upper chest: Negative Review of the MIP images confirms the above findings CTA HEAD FINDINGS Anterior circulation: There is atheromatous irregularity of bilateral carotid siphons with mild narrowing, borderline moderate at the left anterior genu. There is no major branch occlusion. Mild atheromatous type irregularity of bilateral MCA branches. Negative for aneurysm. Posterior circulation: Symmetric vertebral arteries and branching. There is mild proximal  basilar narrowing and severe mid basilar stenosis. The mid basilar stenosis is narrowed from behind by a low-density mural based structure best seen on sagittal reformats-presumably atheromatous plaque. No flap is seen to correlate with previous MRA findings. There is consideration of posterior intramural T1 hyperintense hematoma on the previous MRA, but none seen on the conventional MRI in this location. Confusing right PCA anatomy. 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 There is a at least moderate atheromatous type narrowing of the proximal left P2 segment. Negative for aneurysm or generalized beading. Venous sinuses: Patent Anatomic variants: See description above. Delayed phase: No abnormal intracranial enhancement. Review of the MIP images confirms the above findings IMPRESSION: 1. Severe mid basilar stenosis. No visible  flap or ulceration to correlate with luminal irregularity on MRA yesterday. 2. Intracranial atheromatous irregularity including at least moderate left P2 segment stenosis. 3. Mild atherosclerosis at the cervical carotid bifurcations. No stenosis in the neck. Electronically Signed   By: Monte Fantasia M.D.   On: 10/02/2016 14:48   Ct Angio Neck W Or Wo Contrast  Result Date: 10/02/2016 CLINICAL DATA:  Acute ischemic stroke.  Abnormal MRA. EXAM: CT ANGIOGRAPHY HEAD AND NECK TECHNIQUE: Multidetector CT imaging of the head and neck was performed using the standard protocol during bolus administration of intravenous contrast. Multiplanar CT image reconstructions and MIPs were obtained to evaluate the vascular anatomy. Carotid stenosis measurements (when applicable) are obtained utilizing NASCET criteria, using the distal internal carotid diameter as the denominator. CONTRAST:  50 cc Isovue 370 intravenous COMPARISON:  Brain MRI and MRA from yesterday FINDINGS: CT HEAD FINDINGS Brain: The known acute left pontine infarct is largely obscured by streak artifact. No evidence of interval infarct or hemorrhage. No hydrocephalus. Vascular: See below Skull: No acute or aggressive finding Sinuses: Negative Orbits: Negative Review of the MIP images confirms the above findings CTA NECK FINDINGS Aortic arch: Unremarkable.  Three vessel branching. Right carotid system: Mild atheromatous wall thickening at the common carotid bifurcation, mixed density. No stenosis or ulceration noted. Negative for dissection or beading. Left carotid system: Mild calcified and noncalcified atheromatous wall thickening at the common carotid bifurcation without stenosis, ulceration, or beading. Vertebral arteries: No proximal subclavian stenosis. Codominant vertebral arteries that are smooth and widely patent to the dura. Skeleton: No acute or aggressive finding Other neck: Negative Upper chest: Negative Review of the MIP images confirms the above  findings CTA HEAD FINDINGS Anterior circulation: There is atheromatous irregularity of bilateral carotid siphons with mild narrowing, borderline moderate at the left anterior genu. There is no major branch occlusion. Mild atheromatous type irregularity of bilateral MCA branches. Negative for aneurysm. Posterior circulation: Symmetric vertebral arteries and branching. There is mild proximal basilar narrowing and severe mid basilar stenosis. The mid basilar stenosis is narrowed from behind by a low-density mural based structure best seen on sagittal reformats-presumably atheromatous plaque. No flap is seen to correlate with previous MRA findings. There is consideration of posterior intramural T1 hyperintense hematoma on the previous MRA, but none seen on the conventional MRI in this location. Confusing right PCA anatomy. 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 There is a at least moderate atheromatous type narrowing of the proximal left P2 segment. Negative for aneurysm or generalized beading. Venous sinuses: Patent Anatomic variants: See description above. Delayed phase: No abnormal intracranial enhancement. Review of the MIP images confirms the above findings IMPRESSION: 1. Severe mid basilar stenosis.  No visible flap or ulceration to correlate with luminal irregularity on MRA yesterday. 2. Intracranial atheromatous irregularity including at least moderate left P2 segment stenosis. 3. Mild atherosclerosis at the cervical carotid bifurcations. No stenosis in the neck. Electronically Signed   By: Monte Fantasia M.D.   On: 10/02/2016 14:48   Mr Brain Wo Contrast  Result Date: 10/01/2016 CLINICAL DATA:  Patient presents with RIGHT arm difficulty for 2 days. Difficulty speaking. EXAM: MRI HEAD WITHOUT CONTRAST TECHNIQUE: Multiplanar, multiecho pulse sequences of the brain and surrounding structures were obtained without intravenous contrast. COMPARISON:  CT head  10/01/2016. FINDINGS: Brain: Restricted diffusion LEFT paramedian pons, consistent with acute infarction. No hemorrhage. The infarct is roughly 1 cm in size. No mass lesion, hydrocephalus or extra-axial fluid. Mild cerebral and cerebellar atrophy, premature for age. T2 and FLAIR hyperintensities in the periventricular and subcortical white matter, also moderately advanced, suggesting chronic microvascular ischemic change, likely secondary to hypertension and diabetes. Vascular: Normal flow voids. Skull and upper cervical spine: Normal marrow signal. Sinuses/Orbits: Negative. Other: None. Compared with earlier CT, the infarct is not visible. IMPRESSION: Acute nonhemorrhagic LEFT pontine brainstem infarct. Premature for age atrophy and chronic microvascular ischemic change. Electronically Signed   By: Staci Righter M.D.   On: 10/01/2016 13:01   Mr Jodene Nam Head/brain XB Cm  Result Date: 10/01/2016 CLINICAL DATA:  Right arm difficulty for 2 days. EXAM: MRA HEAD WITHOUT CONTRAST TECHNIQUE: Angiographic images of the Circle of Willis were obtained using MRA technique without intravenous contrast. COMPARISON:  Brain MRI from earlier today FINDINGS: Mildly larger right ICA, there is a large right posterior communicating artery. Flow artifact in the carotid siphons without suspected flow limiting stenosis. No major branch occlusion or proximal stenosis. Poor visualization of distal A1 segments is symmetric and likely artifactual from direction of flow. Negative for aneurysm. Symmetric vertebral arteries and branching. Moderate to advanced mid basilar narrowing with apparent linear defect crossing the lumen at the level of the stenosis. No intramural hematoma seen on conventional MRI. 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 Atheromatous type irregularity of left PCA, greatest at the P3 branch. These results were called by telephone at the time of interpretation on 10/01/2016 at 7:43 pm to Dr. Myna Hidalgo, who verbally  acknowledged these results. 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 This abnormality is at the level of the patient's acute pontine infarct. Electronically Signed   By: Monte Fantasia M.D.   On: 10/01/2016 19:44        Scheduled Meds: . aspirin  300 mg Rectal Daily   Or  . aspirin  325 mg Oral Daily  . atorvastatin  80 mg Oral q1800  . clopidogrel  75 mg Oral Daily  . enoxaparin (LOVENOX) injection  40 mg Subcutaneous Q24H  . feeding supplement (ENSURE ENLIVE)  237 mL Oral Q24H  . insulin aspart  0-5 Units Subcutaneous QHS  . insulin aspart  0-9 Units Subcutaneous TID WC  . insulin aspart  5 Units Subcutaneous TID WC  . insulin glargine  30 Units Subcutaneous QHS  . multivitamin with minerals  1 tablet Oral Daily   Continuous Infusions:    LOS: 1 day    Time spent: 24min    Domenic Polite, MD [facility] Pager [phone]  If 7PM-7AM, please contact night-coverage [email or web address] Password [email or web address] 10/03/2016, 11:30 AM

## 2016-10-04 ENCOUNTER — Encounter (HOSPITAL_COMMUNITY): Payer: Self-pay | Admitting: *Deleted

## 2016-10-04 ENCOUNTER — Inpatient Hospital Stay (HOSPITAL_COMMUNITY)
Admission: RE | Admit: 2016-10-04 | Discharge: 2016-10-12 | DRG: 057 | Disposition: A | Discharge status: Home / Self Care | Payer: BC Managed Care – PPO | Source: Intra-hospital | Attending: Physical Medicine & Rehabilitation | Admitting: Physical Medicine & Rehabilitation

## 2016-10-04 DIAGNOSIS — I69392 Facial weakness following cerebral infarction: Secondary | ICD-10-CM

## 2016-10-04 DIAGNOSIS — R197 Diarrhea, unspecified: Secondary | ICD-10-CM | POA: Diagnosis not present

## 2016-10-04 DIAGNOSIS — I639 Cerebral infarction, unspecified: Secondary | ICD-10-CM | POA: Diagnosis present

## 2016-10-04 DIAGNOSIS — Z6830 Body mass index (BMI) 30.0-30.9, adult: Secondary | ICD-10-CM | POA: Diagnosis not present

## 2016-10-04 DIAGNOSIS — E781 Pure hyperglyceridemia: Secondary | ICD-10-CM

## 2016-10-04 DIAGNOSIS — R740 Nonspecific elevation of levels of transaminase and lactic acid dehydrogenase [LDH]: Secondary | ICD-10-CM

## 2016-10-04 DIAGNOSIS — I69322 Dysarthria following cerebral infarction: Secondary | ICD-10-CM | POA: Diagnosis not present

## 2016-10-04 DIAGNOSIS — E785 Hyperlipidemia, unspecified: Secondary | ICD-10-CM

## 2016-10-04 DIAGNOSIS — E1165 Type 2 diabetes mellitus with hyperglycemia: Secondary | ICD-10-CM

## 2016-10-04 DIAGNOSIS — F0630 Mood disorder due to known physiological condition, unspecified: Secondary | ICD-10-CM

## 2016-10-04 DIAGNOSIS — I10 Essential (primary) hypertension: Secondary | ICD-10-CM

## 2016-10-04 DIAGNOSIS — I69351 Hemiplegia and hemiparesis following cerebral infarction affecting right dominant side: Principal | ICD-10-CM

## 2016-10-04 DIAGNOSIS — G8191 Hemiplegia, unspecified affecting right dominant side: Secondary | ICD-10-CM | POA: Diagnosis not present

## 2016-10-04 DIAGNOSIS — E11649 Type 2 diabetes mellitus with hypoglycemia without coma: Secondary | ICD-10-CM | POA: Diagnosis not present

## 2016-10-04 DIAGNOSIS — E162 Hypoglycemia, unspecified: Secondary | ICD-10-CM

## 2016-10-04 DIAGNOSIS — R739 Hyperglycemia, unspecified: Secondary | ICD-10-CM

## 2016-10-04 DIAGNOSIS — Z7984 Long term (current) use of oral hypoglycemic drugs: Secondary | ICD-10-CM | POA: Diagnosis not present

## 2016-10-04 DIAGNOSIS — I651 Occlusion and stenosis of basilar artery: Secondary | ICD-10-CM

## 2016-10-04 DIAGNOSIS — E669 Obesity, unspecified: Secondary | ICD-10-CM

## 2016-10-04 DIAGNOSIS — I6350 Cerebral infarction due to unspecified occlusion or stenosis of unspecified cerebral artery: Secondary | ICD-10-CM | POA: Diagnosis not present

## 2016-10-04 DIAGNOSIS — K760 Fatty (change of) liver, not elsewhere classified: Secondary | ICD-10-CM | POA: Diagnosis not present

## 2016-10-04 DIAGNOSIS — Z79899 Other long term (current) drug therapy: Secondary | ICD-10-CM

## 2016-10-04 DIAGNOSIS — K909 Intestinal malabsorption, unspecified: Secondary | ICD-10-CM | POA: Diagnosis not present

## 2016-10-04 DIAGNOSIS — R7401 Elevation of levels of liver transaminase levels: Secondary | ICD-10-CM

## 2016-10-04 DIAGNOSIS — I5032 Chronic diastolic (congestive) heart failure: Secondary | ICD-10-CM

## 2016-10-04 LAB — HEMOGLOBIN A1C
HEMOGLOBIN A1C: 13.6 % — AB (ref 4.8–5.6)
MEAN PLASMA GLUCOSE: 344 mg/dL

## 2016-10-04 LAB — GLUCOSE, CAPILLARY
GLUCOSE-CAPILLARY: 298 mg/dL — AB (ref 65–99)
Glucose-Capillary: 354 mg/dL — ABNORMAL HIGH (ref 65–99)
Glucose-Capillary: 298 mg/dL — ABNORMAL HIGH (ref 65–99)

## 2016-10-04 LAB — BETA-2-GLYCOPROTEIN I ABS, IGG/M/A
Beta-2-Glycoprotein I IgA: <9 GPI IgA units (ref 0–25)
Beta-2-Glycoprotein I IgM: <9 GPI IgM units (ref 0–32)

## 2016-10-04 LAB — PROTEIN S, TOTAL: PROTEIN S AG TOTAL: 164 % — AB (ref 60–150)

## 2016-10-04 LAB — LUPUS ANTICOAGULANT PANEL
DRVVT: 35 s (ref 0.0–47.0)
PTT LA: 25.4 s (ref 0.0–51.9)

## 2016-10-04 LAB — HOMOCYSTEINE: HOMOCYSTEINE-NORM: 15.1 umol/L — AB (ref 0.0–15.0)

## 2016-10-04 LAB — PROTEIN S ACTIVITY: PROTEIN S ACTIVITY: 130 % (ref 63–140)

## 2016-10-04 LAB — PROTEIN C ACTIVITY: Protein C Activity: 195 % — ABNORMAL HIGH (ref 73–180)

## 2016-10-04 MED ORDER — ATORVASTATIN CALCIUM 80 MG PO TABS
80.0000 mg | ORAL_TABLET | Freq: Every day | ORAL | Status: DC
  Administered 2016-10-04 – 2016-10-11 (×8): 80 mg via ORAL
  Filled 2016-10-04 (×9): qty 1

## 2016-10-04 MED ORDER — CLOPIDOGREL BISULFATE 75 MG PO TABS
75.0000 mg | ORAL_TABLET | Freq: Every day | ORAL | Status: DC
  Administered 2016-10-05 – 2016-10-12 (×8): 75 mg via ORAL
  Filled 2016-10-04 (×8): qty 1

## 2016-10-04 MED ORDER — SENNOSIDES-DOCUSATE SODIUM 8.6-50 MG PO TABS: 1.0000 | ORAL_TABLET | Freq: Every evening | ORAL | Status: DC | PRN

## 2016-10-04 MED ORDER — INSULIN GLARGINE 100 UNIT/ML ~~LOC~~ SOLN: 40.0000 [IU] | Freq: Every day | SUBCUTANEOUS | Status: DC

## 2016-10-04 MED ORDER — INSULIN ASPART 100 UNIT/ML ~~LOC~~ SOLN
0.0000 [IU] | Freq: Three times a day (TID) | SUBCUTANEOUS | Status: DC
  Administered 2016-10-05: 5 [IU] via SUBCUTANEOUS
  Administered 2016-10-06: 1 [IU] via SUBCUTANEOUS
  Administered 2016-10-06: 5 [IU] via SUBCUTANEOUS
  Administered 2016-10-06: 3 [IU] via SUBCUTANEOUS
  Administered 2016-10-07 (×2): 2 [IU] via SUBCUTANEOUS
  Administered 2016-10-07: 5 [IU] via SUBCUTANEOUS
  Administered 2016-10-08: 2 [IU] via SUBCUTANEOUS
  Administered 2016-10-08 (×2): 3 [IU] via SUBCUTANEOUS
  Administered 2016-10-09: 2 [IU] via SUBCUTANEOUS
  Administered 2016-10-09 – 2016-10-10 (×3): 1 [IU] via SUBCUTANEOUS
  Administered 2016-10-11: 2 [IU] via SUBCUTANEOUS
  Administered 2016-10-12: 1 [IU] via SUBCUTANEOUS

## 2016-10-04 MED ORDER — INSULIN ASPART 100 UNIT/ML ~~LOC~~ SOLN
5.0000 [IU] | Freq: Three times a day (TID) | SUBCUTANEOUS | Status: DC
Start: 2016-10-05 — End: 2016-10-11
  Administered 2016-10-05 – 2016-10-11 (×19): 5 [IU] via SUBCUTANEOUS

## 2016-10-04 MED ORDER — PROCHLORPERAZINE EDISYLATE 5 MG/ML IJ SOLN: 5.0000 mg | Freq: Four times a day (QID) | INTRAMUSCULAR | Status: DC | PRN

## 2016-10-04 MED ORDER — ENOXAPARIN SODIUM 40 MG/0.4ML ~~LOC~~ SOLN
40.0000 mg | SUBCUTANEOUS | Status: DC
  Administered 2016-10-04 – 2016-10-11 (×8): 40 mg via SUBCUTANEOUS
  Filled 2016-10-04 (×8): qty 0.4

## 2016-10-04 MED ORDER — FLEET ENEMA 7-19 GM/118ML RE ENEM: 1.0000 | ENEMA | Freq: Once | RECTAL | Status: DC | PRN

## 2016-10-04 MED ORDER — ASPIRIN 325 MG PO TABS: 325.0000 mg | ORAL_TABLET | Freq: Every day | ORAL | Status: DC

## 2016-10-04 MED ORDER — INSULIN GLARGINE 100 UNIT/ML ~~LOC~~ SOLN
30.0000 [IU] | Freq: Every day | SUBCUTANEOUS | Status: DC
  Administered 2016-10-04 – 2016-10-05 (×2): 30 [IU] via SUBCUTANEOUS
  Filled 2016-10-04 (×3): qty 0.3

## 2016-10-04 MED ORDER — INSULIN ASPART 100 UNIT/ML ~~LOC~~ SOLN
0.0000 [IU] | Freq: Every day | SUBCUTANEOUS | Status: DC
  Administered 2016-10-04: 3 [IU] via SUBCUTANEOUS
  Administered 2016-10-06: 2 [IU] via SUBCUTANEOUS

## 2016-10-04 MED ORDER — ACETAMINOPHEN 325 MG PO TABS
325.0000 mg | ORAL_TABLET | ORAL | Status: DC | PRN
  Administered 2016-10-04 – 2016-10-11 (×17): 650 mg via ORAL
  Filled 2016-10-04 (×17): qty 2

## 2016-10-04 MED ORDER — ASPIRIN EC 325 MG PO TBEC
325.0000 mg | DELAYED_RELEASE_TABLET | Freq: Every day | ORAL | Status: DC
  Administered 2016-10-05 – 2016-10-12 (×8): 325 mg via ORAL
  Filled 2016-10-04 (×8): qty 1

## 2016-10-04 MED ORDER — ADULT MULTIVITAMIN W/MINERALS CH
1.0000 | ORAL_TABLET | Freq: Every day | ORAL | Status: DC
  Administered 2016-10-05 – 2016-10-12 (×8): 1 via ORAL
  Filled 2016-10-04 (×8): qty 1

## 2016-10-04 MED ORDER — DIPHENHYDRAMINE HCL 12.5 MG/5ML PO ELIX: 12.5000 mg | ORAL_SOLUTION | Freq: Four times a day (QID) | ORAL | Status: DC | PRN

## 2016-10-04 MED ORDER — ENSURE ENLIVE PO LIQD
237.0000 mL | ORAL | Status: DC
  Administered 2016-10-05 – 2016-10-06 (×2): 237 mL via ORAL

## 2016-10-04 MED ORDER — ATORVASTATIN CALCIUM 80 MG PO TABS: 80.0000 mg | ORAL_TABLET | Freq: Every day | ORAL | Status: DC

## 2016-10-04 MED ORDER — PROCHLORPERAZINE MALEATE 5 MG PO TABS
5.0000 mg | ORAL_TABLET | Freq: Four times a day (QID) | ORAL | Status: DC | PRN
  Administered 2016-10-08: 10 mg via ORAL
  Filled 2016-10-04 (×2): qty 2

## 2016-10-04 MED ORDER — INSULIN ASPART 100 UNIT/ML ~~LOC~~ SOLN: 6.0000 [IU] | Freq: Three times a day (TID) | SUBCUTANEOUS | Status: DC

## 2016-10-04 MED ORDER — TRAZODONE HCL 50 MG PO TABS
25.0000 mg | ORAL_TABLET | Freq: Every evening | ORAL | Status: DC | PRN
Start: 2016-10-04 — End: 2016-10-12

## 2016-10-04 MED ORDER — INSULIN STARTER KIT- PEN NEEDLES (ENGLISH)
1.0000 | Freq: Once | Status: AC
  Administered 2016-10-04: 1
  Filled 2016-10-04: qty 1

## 2016-10-04 MED ORDER — PROCHLORPERAZINE 25 MG RE SUPP: 12.5000 mg | Freq: Four times a day (QID) | RECTAL | Status: DC | PRN

## 2016-10-04 MED ORDER — ALUM & MAG HYDROXIDE-SIMETH 200-200-20 MG/5ML PO SUSP: 30.0000 mL | ORAL | Status: DC | PRN

## 2016-10-04 MED ORDER — CLOPIDOGREL BISULFATE 75 MG PO TABS: 75.0000 mg | ORAL_TABLET | Freq: Every day | ORAL | Status: DC

## 2016-10-04 MED ORDER — POLYETHYLENE GLYCOL 3350 17 G PO PACK: 17.0000 g | PACK | Freq: Every day | ORAL | Status: DC | PRN

## 2016-10-04 MED ORDER — CYCLOBENZAPRINE HCL 5 MG PO TABS: 5.0000 mg | ORAL_TABLET | Freq: Three times a day (TID) | ORAL | Status: DC | PRN

## 2016-10-04 MED ORDER — METFORMIN HCL 500 MG PO TABS
500.0000 mg | ORAL_TABLET | Freq: Two times a day (BID) | ORAL | Status: DC
  Administered 2016-10-05: 500 mg via ORAL
  Filled 2016-10-04: qty 1

## 2016-10-04 MED ORDER — BISACODYL 10 MG RE SUPP: 10.0000 mg | Freq: Every day | RECTAL | Status: DC | PRN

## 2016-10-04 MED ORDER — GUAIFENESIN-DM 100-10 MG/5ML PO SYRP: 5.0000 mL | ORAL_SOLUTION | Freq: Four times a day (QID) | ORAL | Status: DC | PRN

## 2016-10-04 MED ORDER — LIVING WELL WITH DIABETES BOOK
Freq: Once | Status: AC
  Administered 2016-10-04: 13:00:00
  Filled 2016-10-04: qty 1

## 2016-10-04 NOTE — H&P (Signed)
Physical Medicine and Rehabilitation Admission H&P    Chief Complaint  Patient presents with  . Right sided weakness, right facial droop with slurred speech.     HPI:  Shelley Frazier is a 51 y.o. right hand female with history of HTN, T2DM, fatty liver who was admitted to [facility] on 10/01/16 with history of right sided weakness and slurred speech that started the night before. MRI brain done revealing "Acute nonhemorrhagic LEFT pontine brainstem infarct. 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 Dr. Erlinda Hong recommended DAPT for 3 months followed by plavix alone.      Review of Systems  HENT: Negative for hearing loss and tinnitus.   Eyes: Negative for blurred vision and double vision.  Respiratory: Negative for cough and shortness of breath.   Cardiovascular: Negative for chest pain and palpitations.  Gastrointestinal: Negative for constipation, diarrhea, heartburn and nausea.  Genitourinary: Negative for dysuria and urgency.  Musculoskeletal: Positive for myalgias. Negative for back pain and neck pain.  Skin: Negative for itching and rash.  Neurological: Positive for sensory change, speech change and focal weakness. Negative for dizziness and headaches.  Psychiatric/Behavioral: Negative for depression. The patient is nervous/anxious and has insomnia.   All other systems reviewed and are negative.     Past Medical History:  Diagnosis Date  . Back pain   . Diabetes mellitus without complication ([place])   . Fatty liver   . Gallstones   . Hot flashes 07/30/2013  . Hypertension   . Obesity   . Perimenopausal symptoms 07/30/2013    Past Surgical History:  Procedure Laterality Date  . TONSILLECTOMY AND ADENOIDECTOMY    . TUBAL LIGATION      Family History  Problem Relation Age of Onset  . Hypertension Mother   .  Gallbladder disease Mother   . Diabetes Father   . Hypertension Father   . Gallbladder disease Maternal Aunt   . Heart disease Paternal Aunt   . Heart disease Paternal Uncle   . Diabetes Maternal Grandfather   . Heart disease Paternal Grandmother   . Diabetes Paternal Grandmother     Social History:  reports that she has never smoked. She has never used smokeless tobacco. She reports that she does not drink alcohol or use drugs.      Allergies  Allergen Reactions  . Vicodin [Hydrocodone-Acetaminophen] Nausea And Vomiting   Medications Prior to Admission  Medication Sig Dispense Refill  . COD LIVER OIL PO Take by mouth daily.    Marland Kitchen glimepiride (AMARYL) 2 MG tablet Take 2 mg by mouth daily with breakfast.    . ibuprofen (ADVIL,MOTRIN) 200 MG tablet Take 200 mg by mouth every 6 (six) hours as needed for pain.    Marland Kitchen lisinopril (PRINIVIL,ZESTRIL) 2.5 MG tablet Take 2.5 mg by mouth daily.    Marland Kitchen lovastatin (MEVACOR) 40 MG tablet Take 40 mg by mouth at bedtime.    . metFORMIN (GLUCOPHAGE) 1000 MG tablet Take 1,000 mg by mouth 2 (two) times daily with a meal.    . naproxen sodium (ANAPROX) 220 MG tablet Take 440 mg by mouth 2 (two) times daily with a meal.    . Multiple Vitamin (MULTIVITAMIN) tablet Take 1 tablet by mouth daily.      Home: Home Living Family/patient expects to be discharged to:: Private residence Living Arrangements: Spouse/significant other, Children Available Help at  Discharge: Family, Available 24 hours/day Type of Home: House Home Access: Stairs to enter [employer] of Steps: 5 Entrance Stairs-Rails: Can reach both, Right, Left Home Layout: One level Bathroom Shower/Tub: Walk-in shower Home Equipment: None  Lives With: Spouse   Functional History: Prior Function Level of Independence: Independent Comments: Drives. Works full time doing office work for [employer].  Functional Status:  Mobility: Bed Mobility Overal bed mobility: Needs Assistance Bed  Mobility: Supine to Sit, Sit to Supine Supine to sit: Supervision Sit to supine: Min assist General bed mobility comments: Increased time and effort with use of bed rails.  Assist to bring LE's onto bed. Transfers Overall transfer level: Needs assistance Equipment used:  (Holding bedside table for stability) Transfers: Sit to/from Stand Sit to Stand: Min assist General transfer comment: Verbal cues to push up from bed rather than reach for furniture.  Assist to steady during transfer to standing. Ambulation/Gait Ambulation/Gait assistance: Mod assist Ambulation Distance (Feet): 20 Feet Assistive device: 1 person hand held assist Gait Pattern/deviations: Step-to pattern, Decreased stance time - right, Decreased step length - left, Decreased stride length, Decreased dorsiflexion - right, Decreased weight shift to right, Shuffle, Trunk flexed General Gait Details: Patient with decreased Rt knee control in stance, with knee in fixed extended position.  Knee buckles in stance if not locked into extension.  Gait unsteady requiring mod assist for balance/safety. Gait velocity: decreased Gait velocity interpretation: Below normal speed for age/gender    ADL: ADL Overall ADL's : Needs assistance/impaired Eating/Feeding: Set up, Sitting Eating/Feeding Details (indicate cue type and reason): Pt reports using R hand to feed self with assistance from L hand. Grooming: Minimal assistance, Sitting Upper Body Bathing: Minimal assistance, Sitting Lower Body Bathing: Min guard, Sit to/from stand Upper Body Dressing : Minimal assistance, Sitting Lower Body Dressing: Min guard, Sit to/from stand Toilet Transfer Details (indicate cue type and reason): unable to test due to IV team arriving General ADL Comments: Session focused on R UE strengthening and fine motor coordination for continued education. Educated pt and family concerning position of hand with digits extended in order to facilitate functional  positioning for ADL. Pt reports practicing writing and coloring today. Educated pt on importance of incorporating R UE into ADL participation at least as a functional assist.  Cognition: Cognition Overall Cognitive Status: Within Functional Limits for tasks assessed Arousal/Alertness: Awake/alert Orientation Level: Oriented X4 Attention: Sustained Sustained Attention: Appears intact Memory: Appears intact Awareness: Appears intact Problem Solving: Appears intact Safety/Judgment: Appears intact Cognition Arousal/Alertness: Awake/alert Behavior During Therapy: WFL for tasks assessed/performed Overall Cognitive Status: Within Functional Limits for tasks assessed  Physical Exam: Blood pressure (!) 162/81, pulse 77, temperature 98.1 F (36.7 C), temperature source Oral, resp. rate 18, height 5\' 3"  (1.6 m), weight 78.4 kg (172 lb 12.8 oz), last menstrual period 04/18/2013, SpO2 98 %. Physical Exam  Nursing note and vitals reviewed. Constitutional: She is oriented to person, place, and time. She appears well-developed and well-nourished.  HENT:  Head: Normocephalic and atraumatic.  Eyes: Conjunctivae are normal. Pupils are equal, round, and reactive to light.  Neck: Normal range of motion. Neck supple.  Cardiovascular: Normal rate and regular rhythm.   Respiratory: Effort normal and breath sounds normal. No stridor. No respiratory distress. She has no wheezes.  GI: Soft. Bowel sounds are normal. She exhibits no distension. There is no tenderness.  Musculoskeletal: She exhibits no edema or tenderness.  Neurological: She is alert and oriented to person, place, and time. A cranial nerve  deficit is present.  Right facial weakness with mild dysarthria.  Decreased sensation right hand and RLE.  Able to follow basic commands without difficulty.  Motor: LUE/LLE 5/5 proximal to distal  RUE/LRE: 3+/5 proximal to distal   Skin: Skin is warm and dry.  Psychiatric: She has a normal mood and  affect. Her behavior is normal. Judgment and thought content normal.    Results for orders placed or performed during the hospital encounter of 10/01/16 (from the past 48 hour(s))  Lipid panel     Status: Abnormal   Collection Time: 10/02/16 12:34 PM  Result Value Ref Range   Cholesterol 267 (H) 0 - 200 mg/dL   Triglycerides 660 (H) <150 mg/dL   HDL 25 (L) >40 mg/dL   Total CHOL/HDL Ratio 10.7 RATIO   VLDL UNABLE TO CALCULATE IF TRIGLYCERIDE OVER 400 mg/dL 0 - 40 mg/dL   LDL Cholesterol UNABLE TO CALCULATE IF TRIGLYCERIDE OVER 400 mg/dL 0 - 99 mg/dL    Comment:        Total Cholesterol/HDL:CHD Risk Coronary Heart Disease Risk Table                     Men   Women  1/2 Average Risk   3.4   3.3  Average Risk       5.0   4.4  2 X Average Risk   9.6   7.1  3 X Average Risk  23.4   11.0        Use the calculated Patient Ratio above and the CHD Risk Table to determine the patient's CHD Risk.        ATP III CLASSIFICATION (LDL):  <100     mg/dL   Optimal  100-129  mg/dL   Near or Above                    Optimal  130-159  mg/dL   Borderline  160-189  mg/dL   High  >190     mg/dL   Very High   Glucose, capillary     Status: Abnormal   Collection Time: 10/02/16  4:53 PM  Result Value Ref Range   Glucose-Capillary 303 (H) 65 - 99 mg/dL  Glucose, capillary     Status: Abnormal   Collection Time: 10/02/16  9:11 PM  Result Value Ref Range   Glucose-Capillary 210 (H) 65 - 99 mg/dL  Glucose, capillary     Status: Abnormal   Collection Time: 10/03/16  8:03 AM  Result Value Ref Range   Glucose-Capillary 339 (H) 65 - 99 mg/dL  Glucose, capillary     Status: Abnormal   Collection Time: 10/03/16 12:32 PM  Result Value Ref Range   Glucose-Capillary 327 (H) 65 - 99 mg/dL  Glucose, capillary     Status: Abnormal   Collection Time: 10/03/16  4:42 PM  Result Value Ref Range   Glucose-Capillary 278 (H) 65 - 99 mg/dL  Glucose, capillary     Status: Abnormal   Collection Time: 10/03/16   9:28 PM  Result Value Ref Range   Glucose-Capillary 299 (H) 65 - 99 mg/dL  Glucose, capillary     Status: Abnormal   Collection Time: 10/04/16  7:55 AM  Result Value Ref Range   Glucose-Capillary 354 (H) 65 - 99 mg/dL   Ct Angio Head W Or Wo Contrast  Result Date: 10/02/2016 CLINICAL DATA:  Acute ischemic stroke.  Abnormal MRA. EXAM: CT ANGIOGRAPHY HEAD AND NECK TECHNIQUE:  Multidetector CT imaging of the head and neck was performed using the standard protocol during bolus administration of intravenous contrast. Multiplanar CT image reconstructions and MIPs were obtained to evaluate the vascular anatomy. Carotid stenosis measurements (when applicable) are obtained utilizing NASCET criteria, using the distal internal carotid diameter as the denominator. CONTRAST:  50 cc Isovue 370 intravenous COMPARISON:  Brain MRI and MRA from yesterday FINDINGS: CT HEAD FINDINGS Brain: The known acute left pontine infarct is largely obscured by streak artifact. No evidence of interval infarct or hemorrhage. No hydrocephalus. Vascular: See below Skull: No acute or aggressive finding Sinuses: Negative Orbits: Negative Review of the MIP images confirms the above findings CTA NECK FINDINGS Aortic arch: Unremarkable.  Three vessel branching. Right carotid system: Mild atheromatous wall thickening at the common carotid bifurcation, mixed density. No stenosis or ulceration noted. Negative for dissection or beading. Left carotid system: Mild calcified and noncalcified atheromatous wall thickening at the common carotid bifurcation without stenosis, ulceration, or beading. Vertebral arteries: No proximal subclavian stenosis. Codominant vertebral arteries that are smooth and widely patent to the dura. Skeleton: No acute or aggressive finding Other neck: Negative Upper chest: Negative Review of the MIP images confirms the above findings CTA HEAD FINDINGS Anterior circulation: There is atheromatous irregularity of bilateral carotid  siphons with mild narrowing, borderline moderate at the left anterior genu. There is no major branch occlusion. Mild atheromatous type irregularity of bilateral MCA branches. Negative for aneurysm. Posterior circulation: Symmetric vertebral arteries and branching. There is mild proximal basilar narrowing and severe mid basilar stenosis. The mid basilar stenosis is narrowed from behind by a low-density mural based structure best seen on sagittal reformats-presumably atheromatous plaque. No flap is seen to correlate with previous MRA findings. There is consideration of posterior intramural T1 hyperintense hematoma on the previous MRA, but none seen on the conventional MRI in this location. Confusing right PCA anatomy. 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 There is a at least moderate atheromatous type narrowing of the proximal left P2 segment. Negative for aneurysm or generalized beading. Venous sinuses: Patent Anatomic variants: See description above. Delayed phase: No abnormal intracranial enhancement. Review of the MIP images confirms the above findings IMPRESSION: 1. Severe mid basilar stenosis. No visible flap or ulceration to correlate with luminal irregularity on MRA yesterday. 2. Intracranial atheromatous irregularity including at least moderate left P2 segment stenosis. 3. Mild atherosclerosis at the cervical carotid bifurcations. No stenosis in the neck. Electronically Signed   By: Monte Fantasia M.D.   On: 10/02/2016 14:48   Ct Angio Neck W Or Wo Contrast  Result Date: 10/02/2016 CLINICAL DATA:  Acute ischemic stroke.  Abnormal MRA. EXAM: CT ANGIOGRAPHY HEAD AND NECK TECHNIQUE: Multidetector CT imaging of the head and neck was performed using the standard protocol during bolus administration of intravenous contrast. Multiplanar CT image reconstructions and MIPs were obtained to evaluate the vascular anatomy. Carotid stenosis measurements (when  applicable) are obtained utilizing NASCET criteria, using the distal internal carotid diameter as the denominator. CONTRAST:  50 cc Isovue 370 intravenous COMPARISON:  Brain MRI and MRA from yesterday FINDINGS: CT HEAD FINDINGS Brain: The known acute left pontine infarct is largely obscured by streak artifact. No evidence of interval infarct or hemorrhage. No hydrocephalus. Vascular: See below Skull: No acute or aggressive finding Sinuses: Negative Orbits: Negative Review of the MIP images confirms the above findings CTA NECK FINDINGS Aortic arch: Unremarkable.  Three vessel branching. Right carotid system: Mild  atheromatous wall thickening at the common carotid bifurcation, mixed density. No stenosis or ulceration noted. Negative for dissection or beading. Left carotid system: Mild calcified and noncalcified atheromatous wall thickening at the common carotid bifurcation without stenosis, ulceration, or beading. Vertebral arteries: No proximal subclavian stenosis. Codominant vertebral arteries that are smooth and widely patent to the dura. Skeleton: No acute or aggressive finding Other neck: Negative Upper chest: Negative Review of the MIP images confirms the above findings CTA HEAD FINDINGS Anterior circulation: There is atheromatous irregularity of bilateral carotid siphons with mild narrowing, borderline moderate at the left anterior genu. There is no major branch occlusion. Mild atheromatous type irregularity of bilateral MCA branches. Negative for aneurysm. Posterior circulation: Symmetric vertebral arteries and branching. There is mild proximal basilar narrowing and severe mid basilar stenosis. The mid basilar stenosis is narrowed from behind by a low-density mural based structure best seen on sagittal reformats-presumably atheromatous plaque. No flap is seen to correlate with previous MRA findings. There is consideration of posterior intramural T1 hyperintense hematoma on the previous MRA, but none seen on  the conventional MRI in this location. Confusing right PCA anatomy. 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 There is a at least moderate atheromatous type narrowing of the proximal left P2 segment. Negative for aneurysm or generalized beading. Venous sinuses: Patent Anatomic variants: See description above. Delayed phase: No abnormal intracranial enhancement. Review of the MIP images confirms the above findings IMPRESSION: 1. Severe mid basilar stenosis. No visible flap or ulceration to correlate with luminal irregularity on MRA yesterday. 2. Intracranial atheromatous irregularity including at least moderate left P2 segment stenosis. 3. Mild atherosclerosis at the cervical carotid bifurcations. No stenosis in the neck. Electronically Signed   By: Monte Fantasia M.D.   On: 10/02/2016 14:48       Medical Problem List and Plan: 1.  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  2.  DVT Prophylaxis/Anticoagulation: Pharmaceutical: Lovenox 3. Pain Management: N/A 4. Mood: LCSW to follow for evaluation and support.  5. Neuropsych: This patient is capable of making decisions on her own behalf. 6. Skin/Wound Care: routine pressure relief mesures 7. Fluids/Electrolytes/Nutrition: Monitor I/O. Check lytes in am.  8. HTN: Monitor BP bid--permissive HTN with long term goal 130-150 due to BA stenosis.  9. T2DM: Poorly controlled with Hgb A1c-   Started on lantus with meal coverage. Will resume metformin as 48 hours post procedure.  Monitor BS ac/hs and titrate insulin as needed.  10. Dyslipidemia: On Lipitor.      Post Admission Physician Evaluation: 1. Preadmission assessment reviewed and changes made below. 2. Functional deficits secondary  to lef tontine infqq. 3. Patient is admitted to receive collaborative, interdisciplinary care between the physiatrist, rehab nursing staff, and therapy team. 4. Patient's level of  medical complexity and substantial therapy needs in context of that medical necessity cannot be provided at a lesser intensity of care such as a SNF. 5. Patient has experienced substantial functional loss from his/her baseline which was documented above under the "Functional History" and "Functional Status" headings.  Judging by the patient's diagnosis, physical exam, and functional history, the patient has potential for functional progress which will result in measurable gains while on inpatient rehab.  These gains will be of substantial and practical use upon discharge  in facilitating mobility and self-care at the household level. 6. Physiatrist will provide 24 hour management of medical needs as well as oversight of the therapy  plan/treatment and provide guidance as appropriate regarding the interaction of the two. 7. The Preadmission Screening has been reviewed and patient status is unchanged unless otherwise stated above. 8. 24 hour rehab nursing will assist with   and help integrate therapy concepts, techniques,education, etc. 9. PT will assess and treat for/with: Lower extremity strength, range of motion, stamina, balance, functional mobility, safety, adaptive techniques and equipment, coping skills, pain control, stroke education.   Goals are: supervision/mod I. 10. OT will assess and treat for/with: ADL's, functional mobility, safety, upper extremity strength, adaptive techniques and equipment, wound mgt, ego support, and community reintegration.   Goals are: Mod I/Supervision. Therapy may proceed with showering this patient. 11. SLP will assess and treat for/with: cognition, swallowing.  Goals are: Mod I/Ind. 12. Case Management and Social Worker will assess and treat for psychological issues and discharge planning. 70. Team conference will be held weekly to assess progress toward goals and to determine barriers to discharge. 14. Patient will receive at least 3 hours of therapy per day at least  5 days per week. 15. ELOS: 10-15 days.       16. Prognosis:  good   Delice Lesch, MD, [address] [place] 10/04/2016

## 2016-10-04 NOTE — Progress Notes (Signed)
Inpatient Rehabilitation  I met with the patient and her husband at the bedside to discuss the recommendation for IP Rehab.  I provided informational booklets and answered their questions.  At their request, I will initiate insurance authorization process for a potential admission to IP rehab.  Please call if questions.  [facility] Admissions Coordinator Cell [phone] Office [phone]

## 2016-10-04 NOTE — Progress Notes (Signed)
PT Cancellation Note  [name] MRN: [id number] DOB: 12-19-1965   Cancelled Treatment:    Reason Eval/Treat Not Completed: Other (comment) (Pt to be admitted to CIR today, will defer PT services to inpatient rehab at this time.  )   Cristela Blue 10/04/2016, 3:03 PM Governor Rooks, PTA pager [phone]

## 2016-10-04 NOTE — PMR Pre-admission (Signed)
PMR Admission Coordinator Pre-Admission Assessment  Patient: Shelley Frazier is an 51 y.o., female MRN: [id number] DOB: 1966/01/01 Height: '5\' 3"'  (160 cm) Weight: 78.4 kg (172 lb 12.8 oz)              Insurance Information HMO:      PPO:  X     PCP:      IPA:      80/20:      OTHER:  PRIMARY:  BCBS State      Policy#:  [id number]      Subscriber:  self CM Name:  Shelley Frazier     Phone#:  [phone]     Fax#: [phone] Pre-Cert#:  [id number], approved 10/04/16-10/14/16 with clinical updates due 10/14/16      Employer:  [employer] Benefits:  Phone #:  [phone]     Name:  Shelley Frazier. Date:  05/31/16     Deduct:  $1250      Out of Pocket Max:  $4350      Life Max: n/a CIR:  80%/20%      SNF:  80%/20% Outpatient:  80%/20%     Co-Pay:  Home Health:  80%/20%      Co-Pay:   DME:  80%     Co-Pay: 20% Providers:  In network SECONDARY:       Policy#:       Subscriber:  CM Name:       Phone#:      Fax#:  Pre-Cert#:       Employer:  Benefits:  Phone #:      Name:  Eff. Date:      Deduct:       Out of Pocket Max:       Life Max:  CIR:       SNF:  Outpatient:      Co-Pay:  Home Health:       Co-Pay:  DME:      Co-Pay:   Medicaid Application Date:       Case Manager:  Disability Application Date:       Case Worker:   Emergency Contact Information Contact Information    Name Relation Home Work Shelley Frazier  [phone]     Current Medical History  Patient Admitting Diagnosis: Acute left pontine infarct History of Present Illness: Shelley Frazier a 51 y.o.right hand femalewith history of hypertension, diabetes mellitus. Per husband, patient, and chart review patient lives with spouse as well as 2 adult children/ independent prior to admissionand working full time. One level home with 5 steps to entry.Family arranging to provide assistance as needed.Presented 10/01/2016 to [place] with right-sided weakness and slurred speech. CT/MRI reviewed,  showing acute left brainstem infarct. Patient did not receive TPA. 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 CT Angio head and neck showed severe mid basilar stenosis no visible flap or ulceration to correlate with luminal irregularity as noted on MRA. Patient did not receive TPA. Echocardiogram with ejection fraction of 75% grade 1 diastolic dysfunction. Neurology consulted presently on aspirin and Plavix for CVA prophylaxis 3 months then Plavix alone. Subcutaneous Lovenox for DVT prophylaxis. Tolerating a regular diet. Physical and occupational therapy evaluations completed with recommendations of physical medicine rehabilitation consult. Patient was admitted for a comprehensive rehabilitation program  Total: 3  NIH    Past Medical History  Past Medical History:  Diagnosis Date  . Back  pain   . Diabetes mellitus without complication ([place])   . Fatty liver   . Gallstones   . Hot flashes 07/30/2013  . Hypertension   . Obesity   . Perimenopausal symptoms 07/30/2013    Family History  family history includes Diabetes in her father, maternal grandfather, and paternal grandmother; Gallbladder disease in her maternal aunt and mother; Heart disease in her paternal aunt, paternal grandmother, and paternal uncle; Hypertension in her father and mother.  Prior Rehab/Hospitalizations:  Has the patient had major surgery during 100 days prior to admission? No  Current Medications   Current Facility-Administered Medications:  .  acetaminophen (TYLENOL) tablet 650 mg, 650 mg, Oral, Q4H PRN, 650 mg at 10/03/16 2035 **OR** acetaminophen (TYLENOL) solution 650 mg, 650 mg, Per Tube, Q4H PRN **OR** acetaminophen (TYLENOL) suppository 650 mg, 650 mg, Rectal, Q4H PRN, Isaac Bliss, Rayford Halsted, MD .  aspirin suppository 300 mg, 300 mg, Rectal, Daily **OR** aspirin tablet 325 mg, 325 mg, Oral, Daily, Isaac Bliss, Rayford Halsted, MD, 325 mg at 10/04/16 [phone] .  atorvastatin (LIPITOR) tablet 80 mg, 80 mg, Oral, q1800, Rosalin Hawking, MD, 80 mg at 10/03/16 1716 .  clopidogrel (PLAVIX) tablet 75 mg, 75 mg, Oral, Daily, Darrel Reach, MD, 75 mg at 10/04/16 8242 .  cyclobenzaprine (FLEXERIL) tablet 5 mg, 5 mg, Oral, TID PRN, Domenic Polite, MD .  enoxaparin (LOVENOX) injection 40 mg, 40 mg, Subcutaneous, Q24H, Isaac Bliss, Rayford Halsted, MD, 40 mg at 10/03/16 1717 .  feeding supplement (ENSURE ENLIVE) (ENSURE ENLIVE) liquid 237 mL, 237 mL, Oral, Q24H, Domenic Polite, MD, 237 mL at 10/04/16 [phone] .  insulin aspart (novoLOG) injection 0-5 Units, 0-5 Units, Subcutaneous, QHS, Isaac Bliss, Rayford Halsted, MD, 3 Units at 10/03/16 2159 .  insulin aspart (novoLOG) injection 0-9 Units, 0-9 Units, Subcutaneous, TID WC, Isaac Bliss, Rayford Halsted, MD, 9 Units at 10/04/16 [phone] .  insulin aspart (novoLOG) injection 5 Units, 5 Units, Subcutaneous, TID WC, Domenic Polite, MD, 5 Units at 10/04/16 [phone] .  insulin glargine (LANTUS) injection 30 Units, 30 Units, Subcutaneous, QHS, Domenic Polite, MD, 30 Units at 10/03/16 2200 .  insulin starter kit- pen needles (English) 1 kit, 1 kit, Other, Once, Domenic Polite, MD .  living well with diabetes book MISC, , Does not apply, Once, Domenic Polite, MD .  multivitamin with minerals tablet 1 tablet, 1 tablet, Oral, Daily, Isaac Bliss, Rayford Halsted, MD, 1 tablet at 10/04/16 [phone] .  senna-docusate (Senokot-S) tablet 1 tablet, 1 tablet, Oral, QHS PRN, Isaac Bliss, Rayford Halsted, MD  Patients Current Diet: Diet heart healthy/carb modified Room service appropriate? 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 None of the above  Prior Functional Level Prior Function Level of Independence: Independent Comments: Drives. Works full time doing office work for [employer].  Self Care: Did the patient need help bathing, dressing, using the toilet or eating?  Independent  Indoor Mobility: Did the patient need assistance with walking from room to room (  with or without device)? Independent  Stairs: Did the patient need assistance with internal or external stairs (with or without device)? Independent  Functional Cognition: Did the patient need help planning regular tasks such as shopping or remembering to take medications? Independent  Current Functional Level Cognition  Arousal/Alertness: Awake/alert Overall Cognitive Status: Within Functional Limits for tasks assessed Orientation Level: Oriented X4 Attention: Sustained Sustained Attention: Appears intact Memory: Appears intact Awareness: Appears intact Problem Solving: Appears intact Safety/Judgment: Appears intact    Extremity Assessment (includes Sensation/Coordination)  Upper Extremity Assessment: Defer to OT evaluation RUE Deficits / Details: Decreased strength: shoulder 4/5, elbow flexion/extension 3/5, grasp 3-/5. Decreased fine motor coordination. Hand brunnstrom level V. RUE Coordination: decreased fine motor, decreased gross motor  Lower Extremity Assessment: RLE deficits/detail RLE Deficits / Details: Hip flexion, knee flex/ext and DF at 4-/5 RLE Coordination: decreased gross motor    ADLs  Overall ADL's : Needs assistance/impaired Eating/Feeding: Set up, Sitting Eating/Feeding Details (indicate cue type and reason): Pt reports using R hand to feed  self with assistance from L hand. Grooming: Minimal assistance, Sitting Upper Body Bathing: Minimal assistance, Sitting Lower Body Bathing: Min guard, Sit to/from stand Upper Body Dressing : Minimal assistance, Sitting Lower Body Dressing: Min guard, Sit to/from stand Toilet Transfer Details (indicate cue type and reason): unable to test due to IV team arriving General ADL Comments: Session focused on R UE strengthening and fine motor coordination for continued education. Educated pt and family concerning position of hand with digits extended in order to facilitate functional positioning for ADL. Pt reports practicing writing and coloring today. Educated pt on importance of incorporating R UE into ADL participation at least as a functional assist.    Mobility  Overal bed mobility: Needs Assistance Bed Mobility: Supine to Sit, Sit to Supine Supine to sit: Supervision Sit to supine: Min assist General bed mobility comments: Increased time and effort with use of bed rails.  Assist to bring LE's onto bed.    Transfers  Overall transfer level: Needs assistance Equipment used:  (Holding bedside table for stability) Transfers: Sit to/from Stand Sit to Stand: Min assist General transfer comment: Verbal cues to push up from bed rather than reach for furniture.  Assist to steady during transfer to standing.    Ambulation / Gait / Stairs / Wheelchair Mobility  Ambulation/Gait Ambulation/Gait assistance: Mod assist Ambulation Distance (Feet): 20 Feet Assistive device: 1 person hand held assist Gait Pattern/deviations: Step-to pattern, Decreased stance time - right, Decreased step length - left, Decreased stride length, Decreased dorsiflexion - right, Decreased weight shift to right, Shuffle, Trunk flexed General Gait Details: Patient with decreased Rt knee control in stance, with knee in fixed extended position.  Knee buckles in stance if not locked into extension.  Gait unsteady requiring mod  assist for balance/safety. Gait velocity: decreased Gait velocity interpretation: Below normal speed for age/gender    Posture / Balance Balance Overall balance assessment: Needs assistance Sitting-balance support: Feet supported, No upper extremity supported Sitting balance-Leahy Scale: Good Standing balance support: Single extremity supported, During functional activity Standing balance-Leahy Scale: Poor    Special needs/care consideration BiPAP/CPAP   no CPM  no Continuous Drip IV    no Dialysis   no        Life Vest   no Oxygen   Special Bed   no Trach Size   n/a Wound Vac (area)   no      Skin    WDL  Bowel mgmt: last BM 10/04/16, continent, assist to bathroom Bladder mgmt:   continent Diabetic mgmt   Metformin home regime, insulin added this hospitalization     Previous Home Environment Living Arrangements: Spouse/significant other, Children  Lives With: Spouse Available Help at Discharge: Family, Available 24 hours/day Type of Home: House Home Layout: One level Home Access: Stairs to enter Entrance Stairs-Rails: Can reach both, Right, Left Entrance Stairs-Number of Steps: 5 Bathroom Shower/Tub: Walk-in shower Home Care Services: No  Discharge Living Setting Plans for Discharge Living Setting: Patient's home Type of Home at Discharge: House Discharge Home Layout: One level Discharge Home Access: Stairs to enter Entrance Stairs-Rails: Right, Left, Can reach both Entrance Stairs-Number of Steps: 5 Discharge Bathroom Shower/Tub: Walk-in shower Discharge Bathroom Toilet: Standard Discharge Bathroom Accessibility: Yes How Accessible: Accessible via walker Does the patient have any problems obtaining your medications?: No  Social/Family/Support Systems Patient Roles: Spouse, Parent Anticipated Caregiver: Junelle Hashemi, husband with assist from 2 adult children who live in the home Anticipated Caregiver's Contact Information: Demaris Bousquet, husband, [phone] Ability/Limitations of Caregiver: husband works full time but has spoken with his employer and plans to take FMLA for as long as needed Caregiver Availability: 24/7 Discharge Plan Discussed with Primary Caregiver: Yes Is Caregiver In Agreement with Plan?: Yes Does Caregiver/Family have Issues with Lodging/Transportation while Pt is in Rehab?: No   Goals/Additional Needs Patient/Family Goal for Rehab: modified independent and supervision PT/OT; ideependent and modified independent SLP Expected length of stay: 10-15 days Cultural Considerations: n/a Dietary Needs: heart healthy, carb modified, thin liquids Equipment Needs: TBA Pt/Family Agrees to Admission and willing to participate: Yes Program Orientation Provided & Reviewed with Pt/Caregiver Including Roles  & Responsibilities: Yes   Decrease burden of Care through IP rehab admission: n/a   Possible need for SNF placement upon discharge:   Not expected   Patient Condition: This patient's condition remains as documented in the consult dated 10/04/16 , in which the Rehabilitation Physician determined and documented that the patient's condition is appropriate for intensive rehabilitative care in an inpatient rehabilitation facility. Will admit to inpatient rehab today.  Preadmission Screen Completed By:  Gerlean Ren, 10/04/2016 12:20 PM ______________________________________________________________________   Discussed status with Dr.  Posey Pronto on 10/04/16 at  1426  and received telephone approval for admission today.  Admission Coordinator:  Gerlean Ren, time 1426 Sudie Grumbling 10/04/16

## 2016-10-04 NOTE — Consult Note (Signed)
Physical Medicine and Rehabilitation Consult Reason for Consult: Right sided weakness with facial droop and slurred speech Referring Physician: [facility]   HPI: Shelley Frazier is a 51 y.o. right hand female with history of hypertension, diabetes mellitus. Per husband, patient, and chart review patient lives with spouse independent prior to admission and working full time. One level home with 5 steps to entry. Family arranging to provide assistance as needed. Presented 10/01/2016 to [facility] with right-sided weakness and slurred speech. CT/MRI reviewed, showing acute left brainstem infarct. Patient did not receive TPA. 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 CT Angio head and neck showed severe mid basilar stenosis no visible flap or ulceration to correlate with luminal irregularity as noted on MRA. Patient did not receive TPA. Echocardiogram with ejection fraction of 34% grade 1 diastolic dysfunction. Neurology consulted presently on aspirin and Plavix for CVA prophylaxis. Subcutaneous Lovenox for DVT prophylaxis. Tolerating a regular diet. Physical therapy evaluation completed with recommendations of physical medicine rehabilitation consult.   Review of Systems  Constitutional: Negative for chills and fever.  HENT: Negative for hearing loss.   Eyes: Negative for blurred vision and double vision.  Respiratory: Negative for cough and shortness of breath.   Cardiovascular: Negative for chest pain and leg swelling.  Gastrointestinal: Positive for constipation. Negative for nausea and vomiting.  Genitourinary: Negative for dysuria, flank pain and hematuria.  Musculoskeletal: Positive for myalgias.  Skin: Negative for rash.  Neurological: Positive for sensory change, speech change and focal weakness. Negative for seizures.  All other systems reviewed and are negative.  Past Medical History:    Diagnosis Date  . Back pain   . Diabetes mellitus without complication ([place])   . Fatty liver   . Gallstones   . Hot flashes 07/30/2013  . Hypertension   . Obesity   . Perimenopausal symptoms 07/30/2013   Past Surgical History:  Procedure Laterality Date  . TONSILLECTOMY AND ADENOIDECTOMY    . TUBAL LIGATION     Family History  Problem Relation Age of Onset  . Hypertension Mother   . Gallbladder disease Mother   . Diabetes Father   . Hypertension Father   . Gallbladder disease Maternal Aunt   . Heart disease Paternal Aunt   . Heart disease Paternal Uncle   . Diabetes Maternal Grandfather   . Heart disease Paternal Grandmother   . Diabetes Paternal Grandmother    Social History:  reports that she has never smoked. She has never used smokeless tobacco. She reports that she does not drink alcohol or use drugs. Allergies:  Allergies  Allergen Reactions  . Vicodin [Hydrocodone-Acetaminophen] Nausea And Vomiting   Medications Prior to Admission  Medication Sig Dispense Refill  . COD LIVER OIL PO Take by mouth daily.    Marland Kitchen glimepiride (AMARYL) 2 MG tablet Take 2 mg by mouth daily with breakfast.    . ibuprofen (ADVIL,MOTRIN) 200 MG tablet Take 200 mg by mouth every 6 (six) hours as needed for pain.    Marland Kitchen lisinopril (PRINIVIL,ZESTRIL) 2.5 MG tablet Take 2.5 mg by mouth daily.    Marland Kitchen lovastatin (MEVACOR) 40 MG tablet Take 40 mg by mouth at bedtime.    . metFORMIN (GLUCOPHAGE) 1000 MG tablet Take 1,000 mg by mouth 2 (two) times daily with a meal.    . naproxen sodium (ANAPROX) 220 MG tablet Take 440 mg by mouth 2 (two) times daily with  a meal.    . Multiple Vitamin (MULTIVITAMIN) tablet Take 1 tablet by mouth daily.      Home: Home Living Family/patient expects to be discharged to:: Private residence Living Arrangements: Spouse/significant other, Children Available Help at Discharge: Family, Available 24 hours/day Type of Home: House Home Access: Stairs to enter [employer] of Steps: 5 Entrance Stairs-Rails: Can reach both, Right, Left Home Layout: One level Bathroom Shower/Tub: Walk-in shower Home Equipment: None  Functional History: Prior Function Level of Independence: Independent Comments: Drives. Works full time doing office work for [employer]. Functional Status:  Mobility: Bed Mobility Overal bed mobility: Needs Assistance Bed Mobility: Supine to Sit, Sit to Supine Supine to sit: Supervision Sit to supine: Min assist General bed mobility comments: Increased time and effort with use of bed rails.  Assist to bring LE's onto bed. Transfers Overall transfer level: Needs assistance Equipment used:  (Holding bedside table for stability) Transfers: Sit to/from Stand Sit to Stand: Min assist General transfer comment: Verbal cues to push up from bed rather than reach for furniture.  Assist to steady during transfer to standing. Ambulation/Gait Ambulation/Gait assistance: Mod assist Ambulation Distance (Feet): 20 Feet Assistive device: 1 person hand held assist Gait Pattern/deviations: Step-to pattern, Decreased stance time - right, Decreased step length - left, Decreased stride length, Decreased dorsiflexion - right, Decreased weight shift to right, Shuffle, Trunk flexed General Gait Details: Patient with decreased Rt knee control in stance, with knee in fixed extended position.  Knee buckles in stance if not locked into extension.  Gait unsteady requiring mod assist for balance/safety. Gait velocity: decreased Gait velocity interpretation: Below normal speed for age/gender    ADL: ADL Overall ADL's : Needs assistance/impaired Eating/Feeding: Set up, Sitting Eating/Feeding Details (indicate cue type and reason): Pt reports using R hand to feed self with assistance from L hand. Grooming: Minimal assistance, Sitting Upper Body Bathing: Minimal assistance, Sitting Lower Body Bathing: Min guard, Sit to/from stand Upper Body Dressing : Minimal  assistance, Sitting Lower Body Dressing: Min guard, Sit to/from stand Toilet Transfer Details (indicate cue type and reason): unable to test due to IV team arriving General ADL Comments: Session focused on R UE strengthening and fine motor coordination for continued education. Educated pt and family concerning position of hand with digits extended in order to facilitate functional positioning for ADL. Pt reports practicing writing and coloring today. Educated pt on importance of incorporating R UE into ADL participation at least as a functional assist.  Cognition: Cognition Overall Cognitive Status: Within Functional Limits for tasks assessed Orientation Level: Oriented X4 Cognition Arousal/Alertness: Awake/alert Behavior During Therapy: WFL for tasks assessed/performed Overall Cognitive Status: Within Functional Limits for tasks assessed  Blood pressure (!) 162/81, pulse 77, temperature 98.1 F (36.7 C), temperature source Oral, resp. rate 18, height 5\' 3"  (1.6 m), weight 78.4 kg (172 lb 12.8 oz), last menstrual period 04/18/2013, SpO2 98 %. Physical Exam  Vitals reviewed. Constitutional: She is oriented to person, place, and time. She appears well-developed and well-nourished.  HENT:  Head: Normocephalic and atraumatic.  Eyes: Conjunctivae and EOM are normal. Right eye exhibits no discharge. Left eye exhibits no discharge.  Neck: Normal range of motion. Neck supple. No thyromegaly present.  Cardiovascular: Normal rate, regular rhythm and normal heart sounds.   Respiratory: Effort normal and breath sounds normal. No respiratory distress.  GI: Soft. Bowel sounds are normal. She exhibits no distension.  Musculoskeletal: She exhibits no edema or tenderness.  Neurological: She is alert and oriented  to person, place, and time.  Speech is mildly dysarthric but fully intelligible.  Right facial droop Follows all commands.  Motor: RUE/RLE 3+/5 proximal to distal LUE/LLE: 5/5 Sensation  intact to light touch DTRs symmetric  Skin: Skin is warm and dry.  Psychiatric: She has a normal mood and affect. Her behavior is normal. Thought content normal.    Results for orders placed or performed during the hospital encounter of 10/01/16 (from the past 24 hour(s))  Glucose, capillary     Status: Abnormal   Collection Time: 10/03/16  8:03 AM  Result Value Ref Range   Glucose-Capillary 339 (H) 65 - 99 mg/dL  Glucose, capillary     Status: Abnormal   Collection Time: 10/03/16 12:32 PM  Result Value Ref Range   Glucose-Capillary 327 (H) 65 - 99 mg/dL  Glucose, capillary     Status: Abnormal   Collection Time: 10/03/16  4:42 PM  Result Value Ref Range   Glucose-Capillary 278 (H) 65 - 99 mg/dL  Glucose, capillary     Status: Abnormal   Collection Time: 10/03/16  9:28 PM  Result Value Ref Range   Glucose-Capillary 299 (H) 65 - 99 mg/dL   Ct Angio Head W Or Wo Contrast  Result Date: 10/02/2016 CLINICAL DATA:  Acute ischemic stroke.  Abnormal MRA. EXAM: CT ANGIOGRAPHY HEAD AND NECK TECHNIQUE: Multidetector CT imaging of the head and neck was performed using the standard protocol during bolus administration of intravenous contrast. Multiplanar CT image reconstructions and MIPs were obtained to evaluate the vascular anatomy. Carotid stenosis measurements (when applicable) are obtained utilizing NASCET criteria, using the distal internal carotid diameter as the denominator. CONTRAST:  50 cc Isovue 370 intravenous COMPARISON:  Brain MRI and MRA from yesterday FINDINGS: CT HEAD FINDINGS Brain: The known acute left pontine infarct is largely obscured by streak artifact. No evidence of interval infarct or hemorrhage. No hydrocephalus. Vascular: See below Skull: No acute or aggressive finding Sinuses: Negative Orbits: Negative Review of the MIP images confirms the above findings CTA NECK FINDINGS Aortic arch: Unremarkable.  Three vessel branching. Right carotid system: Mild atheromatous wall  thickening at the common carotid bifurcation, mixed density. No stenosis or ulceration noted. Negative for dissection or beading. Left carotid system: Mild calcified and noncalcified atheromatous wall thickening at the common carotid bifurcation without stenosis, ulceration, or beading. Vertebral arteries: No proximal subclavian stenosis. Codominant vertebral arteries that are smooth and widely patent to the dura. Skeleton: No acute or aggressive finding Other neck: Negative Upper chest: Negative Review of the MIP images confirms the above findings CTA HEAD FINDINGS Anterior circulation: There is atheromatous irregularity of bilateral carotid siphons with mild narrowing, borderline moderate at the left anterior genu. There is no major branch occlusion. Mild atheromatous type irregularity of bilateral MCA branches. Negative for aneurysm. Posterior circulation: Symmetric vertebral arteries and branching. There is mild proximal basilar narrowing and severe mid basilar stenosis. The mid basilar stenosis is narrowed from behind by a low-density mural based structure best seen on sagittal reformats-presumably atheromatous plaque. No flap is seen to correlate with previous MRA findings. There is consideration of posterior intramural T1 hyperintense hematoma on the previous MRA, but none seen on the conventional MRI in this location. Confusing right PCA anatomy. 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 There is a at least moderate atheromatous type narrowing of the proximal left P2 segment. Negative for aneurysm or generalized beading. Venous sinuses: Patent Anatomic variants: See description above.  Delayed phase: No abnormal intracranial enhancement. Review of the MIP images confirms the above findings IMPRESSION: 1. Severe mid basilar stenosis. No visible flap or ulceration to correlate with luminal irregularity on MRA yesterday. 2. Intracranial atheromatous  irregularity including at least moderate left P2 segment stenosis. 3. Mild atherosclerosis at the cervical carotid bifurcations. No stenosis in the neck. Electronically Signed   By: Monte Fantasia M.D.   On: 10/02/2016 14:48   Ct Angio Neck W Or Wo Contrast  Result Date: 10/02/2016 CLINICAL DATA:  Acute ischemic stroke.  Abnormal MRA. EXAM: CT ANGIOGRAPHY HEAD AND NECK TECHNIQUE: Multidetector CT imaging of the head and neck was performed using the standard protocol during bolus administration of intravenous contrast. Multiplanar CT image reconstructions and MIPs were obtained to evaluate the vascular anatomy. Carotid stenosis measurements (when applicable) are obtained utilizing NASCET criteria, using the distal internal carotid diameter as the denominator. CONTRAST:  50 cc Isovue 370 intravenous COMPARISON:  Brain MRI and MRA from yesterday FINDINGS: CT HEAD FINDINGS Brain: The known acute left pontine infarct is largely obscured by streak artifact. No evidence of interval infarct or hemorrhage. No hydrocephalus. Vascular: See below Skull: No acute or aggressive finding Sinuses: Negative Orbits: Negative Review of the MIP images confirms the above findings CTA NECK FINDINGS Aortic arch: Unremarkable.  Three vessel branching. Right carotid system: Mild atheromatous wall thickening at the common carotid bifurcation, mixed density. No stenosis or ulceration noted. Negative for dissection or beading. Left carotid system: Mild calcified and noncalcified atheromatous wall thickening at the common carotid bifurcation without stenosis, ulceration, or beading. Vertebral arteries: No proximal subclavian stenosis. Codominant vertebral arteries that are smooth and widely patent to the dura. Skeleton: No acute or aggressive finding Other neck: Negative Upper chest: Negative Review of the MIP images confirms the above findings CTA HEAD FINDINGS Anterior circulation: There is atheromatous irregularity of bilateral carotid  siphons with mild narrowing, borderline moderate at the left anterior genu. There is no major branch occlusion. Mild atheromatous type irregularity of bilateral MCA branches. Negative for aneurysm. Posterior circulation: Symmetric vertebral arteries and branching. There is mild proximal basilar narrowing and severe mid basilar stenosis. The mid basilar stenosis is narrowed from behind by a low-density mural based structure best seen on sagittal reformats-presumably atheromatous plaque. No flap is seen to correlate with previous MRA findings. There is consideration of posterior intramural T1 hyperintense hematoma on the previous MRA, but none seen on the conventional MRI in this location. Confusing right PCA anatomy. 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 There is a at least moderate atheromatous type narrowing of the proximal left P2 segment. Negative for aneurysm or generalized beading. Venous sinuses: Patent Anatomic variants: See description above. Delayed phase: No abnormal intracranial enhancement. Review of the MIP images confirms the above findings IMPRESSION: 1. Severe mid basilar stenosis. No visible flap or ulceration to correlate with luminal irregularity on MRA yesterday. 2. Intracranial atheromatous irregularity including at least moderate left P2 segment stenosis. 3. Mild atherosclerosis at the cervical carotid bifurcations. No stenosis in the neck. Electronically Signed   By: Monte Fantasia M.D.   On: 10/02/2016 14:48    Assessment/Plan: Diagnosis: Acute left pontine infarct Labs and images independently reviewed.  Records reviewed and summated above. Stroke: Continue secondary stroke prophylaxis and Risk Factor Modification listed below:   Antiplatelet therapy:   Blood Pressure Management:  Continue current medication with prn's with permisive HTN per primary team Statin Agent:  Diabetes management:   Right sided hemiparesis Motor  recovery: Fluoxetine  1. Does the need for close, 24 hr/day medical supervision in concert with the patient's rehab needs make it unreasonable for this patient to be served in a less intensive setting? Yes 2. Co-Morbidities requiring supervision/potential complications: hypertension (monitor and provide prns in accordance with increased physical exertion and pain), diabetes mellitus, currently uncontrolled (Monitor in accordance with exercise and adjust meds as necessary), diastolic CHF (monitor for signs/symptoms of fluid overload), HLD (cont meds) 3. Due to safety, disease management and patient education, does the patient require 24 hr/day rehab nursing? Yes 4. Does the patient require coordinated care of a physician, rehab nurse, PT (1-2 hrs/day, 5 days/week), OT (1-2 hrs/day, 5 days/week) and SLP (1-2 hrs/day, 5 days/week) to address physical and functional deficits in the context of the above medical diagnosis(es)? Yes Addressing deficits in the following areas: balance, endurance, locomotion, strength, transferring, bathing, dressing, toileting, speech and psychosocial support 5. Can the patient actively participate in an intensive therapy program of at least 3 hrs of therapy per day at least 5 days per week? Yes 6. The potential for patient to make measurable gains while on inpatient rehab is excellent 7. Anticipated functional outcomes upon discharge from inpatient rehab are modified independent and supervision  with PT, modified independent and supervision with OT, independent and modified independent with SLP. 8. Estimated rehab length of stay to reach the above functional goals is: 10-15 days. 9. Does the patient have adequate social supports and living environment to accommodate these discharge functional goals? Yes 10. Anticipated D/C setting: Home 11. Anticipated post D/C treatments: HH therapy and Home excercise program 12. Overall Rehab/Functional Prognosis:  good  RECOMMENDATIONS: This patient's condition is appropriate for continued rehabilitative care in the following setting: CIR Patient has agreed to participate in recommended program. Yes Note that insurance prior authorization may be required for reimbursement for recommended care.  Comment: Rehab Admissions Coordinator to follow up.  Delice Lesch, MD, Mellody Drown Cathlyn Parsons., PA-C 10/04/2016

## 2016-10-04 NOTE — Evaluation (Signed)
Speech Language Pathology Evaluation [name] MRN: [id number] DOB: 1965-11-09 Today's Date: 10/04/2016 Time: 0931-     Problem List:  Patient Active Problem List   Diagnosis Date Noted  . Mixed hyperlipidemia   . Basilar artery stenosis   . 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[place]) 10/01/2016  . DM (diabetes mellitus), type 2 with neurological complications ([place]) 41/58/3094  . Hot flashes 07/30/2013  . Hypertension 07/30/2013  . Perimenopausal symptoms 07/30/2013   Past Medical History:  Past Medical History:  Diagnosis Date  . Back pain   . Diabetes mellitus without complication ([place])   . Fatty liver   . Gallstones   . Hot flashes 07/30/2013  . Hypertension   . Obesity   . Perimenopausal symptoms 07/30/2013   Past Surgical History:  Past Surgical History:  Procedure Laterality Date  . TONSILLECTOMY AND ADENOIDECTOMY    . TUBAL LIGATION     HPI:  Pt is a 51 y.o. female who was transferred from [facility] to [facility] for evaluation of stroke. She presented with slurred speech and R sided weakness with R facial droop. MRI revealed acute stroke in L pons. Pt with PMH signficant for: back pain, diabetes mellitus without complication, fatty liver, gallstones, hypertension, obesity, and perimenopausal symptoms.   Assessment / Plan / Recommendation Clinical Impression  Pt socred a 27/30 on MOCA (26 > normal). No complaints with cognition however speech is intelligible but not at baseline (husband reported difficulty with /j/ which has since improved). 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 She will hopefully be transferring to inpatient rehab.      SLP Assessment  SLP Recommendation/Assessment: All further Speech Lanaguage Pathology  needs can be addressed in the next venue of care SLP Visit Diagnosis: Cognitive communication deficit (R41.841)    Follow Up Recommendations  Inpatient Rehab    Frequency and Duration           SLP Evaluation Cognition  Overall Cognitive Status: Within Functional Limits for tasks assessed Arousal/Alertness: Awake/alert Orientation Level: Oriented X4 Attention: Sustained Sustained Attention: Appears intact Memory: Appears intact Awareness: Appears intact Problem Solving: Appears intact Safety/Judgment: Appears intact       Comprehension  Auditory Comprehension Overall Auditory Comprehension: Appears within functional limits for tasks assessed Visual Recognition/Discrimination Discrimination: Not tested Reading Comprehension Reading Status:  (to be assess on rehab)    Expression Expression Primary Mode of Expression: Verbal Verbal Expression Overall Verbal Expression: Appears within functional limits for tasks assessed Initiation: No impairment Level of Generative/Spontaneous Verbalization: Conversation Repetition: No impairment Naming: No impairment Pragmatics: No impairment Written Expression Dominant Hand: Right Written Expression:  (to be assessed further)   Oral / Motor  Oral Motor/Sensory Function Overall Oral Motor/Sensory Function:  (mild-mod) Motor Speech Overall Motor Speech: Impaired Respiration: Within functional limits Phonation: Normal Resonance: Within functional limits Articulation: Impaired Level of Impairment: Conversation Intelligibility: Intelligible Motor Planning: Witnin functional limits   GO                    Houston Siren 10/04/2016, 9:58 AM  Orbie Pyo Sahith Nurse M.Ed [employer] [phone]

## 2016-10-04 NOTE — Discharge Summary (Addendum)
Physician Discharge Summary  Shelley Frazier RJJ:[id number] DOB: 30-Jun-1965 DOA: 10/01/2016  PCP: Jani Gravel, MD  Admit date: 10/01/2016 Discharge date: 10/04/2016  Time spent: 35 minutes  Recommendations for Outpatient Follow-up:  1. Neurology Dr.Xu in 1-52months 2. PCP Dr.Kim in 1week 3. DC to Rehab, needs to complete Insulin teaching at Rehab   Discharge Diagnoses:  Principal Problem:   Left pontine stroke [facility]) Active Problems:   Hypertension   DM (diabetes mellitus), uncontrolled type 2 with neurological complications (Shelley Frazier)   Mixed hyperlipidemia   Basilar artery stenosis   Acute ischemic stroke [facility])   Hyperglycemia   Chronic diastolic congestive heart failure [facility])   Discharge Condition: stable  Diet recommendation: Diabetic heart healthy  Filed Weights   10/01/16 0951 10/01/16 1747  Weight: 79.8 kg (176 lb) 78.4 kg (172 lb 12.8 oz)    History of present illness:  This is a 43-yo RH woman who is transferred from [facility] for evaluation of stroke. She reports that she developed slurred speech on 09/30/16, with  weakness in the R arm and leg as well as some R facial droop. She presented to [facility] where MRI brain showed an acute stroke in the L pons  Hospital Course:  Acute CVA -with Residual R hemiparesis and Facial droop  -MRI with acute non-hemorrhagic left pontine brainstem infarct  -MRA notes moderate to advance mid-basilar stenosis with luminal irregularity/flap implying focal dissection or irregular plaque, Neuro consulted, started on ASA-325mg /plavix and full dose statin, was on no antiplatelets prior to admission, -CTA neck remarklable for extensive atherosclerosis, BA severe stenosis, no dissection -lipids with very high TG 660 unable to check LDL, hba1c 13 -2D ECHO with normal EF and wall motion -PT/OT/SLP eval completed, CIR consulted for rehab -DC to Rehab when bed available, needs FU with Dr.Xu  HTN -Fair control. -held lisinopril  to allow for permissible HTN -resumed at discharge  DM II uncontrolled -Hba1C 13, was on amaryl and metformin at home -CBGS in 300-400 range,  started lantus inpatient, then increased dose, added novolog meal coverage  -DM coordinator consulted, will need to complete Insulin teaching prior to discharge home  Consultations:  Neuro  Rehab MD  Discharge Exam: Vitals:   10/03/16 2129 10/04/16 0533  BP: (!) 158/75 (!) 162/81  Pulse: 82 77  Resp: 18 18  Temp: 98 F (36.7 C) 98.1 F (36.7 C)    General: AAOx3, R facial droop Cardiovascular: S1S2/RRR Respiratory: CTAB  Discharge Instructions   Discharge Instructions    Ambulatory referral to Neurology    Complete by:  As directed    Follow up with stroke clinic at [facility] in 6 weeks. Thanks.     Current Discharge Medication List    START taking these medications   Details  aspirin 325 MG tablet Take 1 tablet (325 mg total) by mouth daily.    atorvastatin (LIPITOR) 80 MG tablet Take 1 tablet (80 mg total) by mouth daily at 6 PM.    clopidogrel (PLAVIX) 75 MG tablet Take 1 tablet (75 mg total) by mouth daily.    insulin aspart (NOVOLOG) 100 UNIT/ML injection Inject 6 Units into the skin 3 (three) times daily with meals.    insulin glargine (LANTUS) 100 UNIT/ML injection Inject 0.4 mLs (40 Units total) into the skin at bedtime.      CONTINUE these medications which have NOT CHANGED   Details  COD LIVER OIL PO Take by mouth daily.    glimepiride (AMARYL) 2  MG tablet Take 2 mg by mouth daily with breakfast.    lisinopril (PRINIVIL,ZESTRIL) 2.5 MG tablet Take 2.5 mg by mouth daily.    metFORMIN (GLUCOPHAGE) 1000 MG tablet Take 1,000 mg by mouth 2 (two) times daily with a meal.    Multiple Vitamin (MULTIVITAMIN) tablet Take 1 tablet by mouth daily.      STOP taking these medications     ibuprofen (ADVIL,MOTRIN) 200 MG tablet      lovastatin (MEVACOR) 40 MG tablet      naproxen sodium (ANAPROX) 220 MG tablet         Allergies  Allergen Reactions  . Vicodin [Hydrocodone-Acetaminophen] Nausea And Vomiting   Follow-up Information    [name], NP. Schedule an appointment as soon as possible for a visit in 6 week(s).   Specialty:  Family Medicine Contact information: [address] [place] [address] [phone]            The results of significant diagnostics from this hospitalization (including imaging, microbiology, ancillary and laboratory) are listed below for reference.    Significant Diagnostic Studies: Ct Angio Head W Or Wo Contrast  Result Date: 10/02/2016 CLINICAL DATA:  Acute ischemic stroke.  Abnormal MRA. EXAM: CT ANGIOGRAPHY HEAD AND NECK TECHNIQUE: Multidetector CT imaging of the head and neck was performed using the standard protocol during bolus administration of intravenous contrast. Multiplanar CT image reconstructions and MIPs were obtained to evaluate the vascular anatomy. Carotid stenosis measurements (when applicable) are obtained utilizing NASCET criteria, using the distal internal carotid diameter as the denominator. CONTRAST:  50 cc Isovue 370 intravenous COMPARISON:  Brain MRI and MRA from yesterday FINDINGS: CT HEAD FINDINGS Brain: The known acute left pontine infarct is largely obscured by streak artifact. No evidence of interval infarct or hemorrhage. No hydrocephalus. Vascular: See below Skull: No acute or aggressive finding Sinuses: Negative Orbits: Negative Review of the MIP images confirms the above findings CTA NECK FINDINGS Aortic arch: Unremarkable.  Three vessel branching. Right carotid system: Mild atheromatous wall thickening at the common carotid bifurcation, mixed density. No stenosis or ulceration noted. Negative for dissection or beading. Left carotid system: Mild calcified and noncalcified atheromatous wall thickening at the common carotid bifurcation without stenosis, ulceration, or beading. Vertebral arteries: No proximal  subclavian stenosis. Codominant vertebral arteries that are smooth and widely patent to the dura. Skeleton: No acute or aggressive finding Other neck: Negative Upper chest: Negative Review of the MIP images confirms the above findings CTA HEAD FINDINGS Anterior circulation: There is atheromatous irregularity of bilateral carotid siphons with mild narrowing, borderline moderate at the left anterior genu. There is no major branch occlusion. Mild atheromatous type irregularity of bilateral MCA branches. Negative for aneurysm. Posterior circulation: Symmetric vertebral arteries and branching. There is mild proximal basilar narrowing and severe mid basilar stenosis. The mid basilar stenosis is narrowed from behind by a low-density mural based structure best seen on sagittal reformats-presumably atheromatous plaque. No flap is seen to correlate with previous MRA findings. There is consideration of posterior intramural T1 hyperintense hematoma on the previous MRA, but none seen on the conventional MRI in this location. Confusing right PCA anatomy. 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 There is a at least moderate atheromatous type narrowing of the proximal left P2 segment. Negative for aneurysm or generalized beading. Venous sinuses: Patent Anatomic variants: See description above. Delayed phase: No abnormal intracranial enhancement. Review of the MIP images confirms the  above findings IMPRESSION: 1. Severe mid basilar stenosis. No visible flap or ulceration to correlate with luminal irregularity on MRA yesterday. 2. Intracranial atheromatous irregularity including at least moderate left P2 segment stenosis. 3. Mild atherosclerosis at the cervical carotid bifurcations. No stenosis in the neck. Electronically Signed   By: Monte Fantasia M.D.   On: 10/02/2016 14:48   Ct Head Wo Contrast  Result Date: 10/01/2016 CLINICAL DATA:  Slurred speech since yesterday, unable  to write with RIGHT hand, hypertension, diabetes mellitus EXAM: CT HEAD WITHOUT CONTRAST TECHNIQUE: Contiguous axial images were obtained from the base of the skull through the vertex without intravenous contrast. Sagittal and coronal MPR images reconstructed from axial data set. COMPARISON:  None FINDINGS: Brain: Normal ventricular morphology. No midline shift or mass effect. Minimal white matter hypoattenuation question chronic ischemic changes of deep cerebral white matter. Otherwise normal appearance of brain parenchyma. No intracranial hemorrhage, mass lesion, evidence of acute infarction, or extra-axial fluid collection. Vascular: Normal appearance Skull: Intact Sinuses/Orbits: Clear Other: N/A IMPRESSION: Question minimal small vessel chronic ischemic changes of deep cerebral white matter. No acute intracranial abnormalities. Electronically Signed   By: Lavonia Dana M.D.   On: 10/01/2016 11:32   Ct Angio Neck W Or Wo Contrast  Result Date: 10/02/2016 CLINICAL DATA:  Acute ischemic stroke.  Abnormal MRA. EXAM: CT ANGIOGRAPHY HEAD AND NECK TECHNIQUE: Multidetector CT imaging of the head and neck was performed using the standard protocol during bolus administration of intravenous contrast. Multiplanar CT image reconstructions and MIPs were obtained to evaluate the vascular anatomy. Carotid stenosis measurements (when applicable) are obtained utilizing NASCET criteria, using the distal internal carotid diameter as the denominator. CONTRAST:  50 cc Isovue 370 intravenous COMPARISON:  Brain MRI and MRA from yesterday FINDINGS: CT HEAD FINDINGS Brain: The known acute left pontine infarct is largely obscured by streak artifact. No evidence of interval infarct or hemorrhage. No hydrocephalus. Vascular: See below Skull: No acute or aggressive finding Sinuses: Negative Orbits: Negative Review of the MIP images confirms the above findings CTA NECK FINDINGS Aortic arch: Unremarkable.  Three vessel branching. Right  carotid system: Mild atheromatous wall thickening at the common carotid bifurcation, mixed density. No stenosis or ulceration noted. Negative for dissection or beading. Left carotid system: Mild calcified and noncalcified atheromatous wall thickening at the common carotid bifurcation without stenosis, ulceration, or beading. Vertebral arteries: No proximal subclavian stenosis. Codominant vertebral arteries that are smooth and widely patent to the dura. Skeleton: No acute or aggressive finding Other neck: Negative Upper chest: Negative Review of the MIP images confirms the above findings CTA HEAD FINDINGS Anterior circulation: There is atheromatous irregularity of bilateral carotid siphons with mild narrowing, borderline moderate at the left anterior genu. There is no major branch occlusion. Mild atheromatous type irregularity of bilateral MCA branches. Negative for aneurysm. Posterior circulation: Symmetric vertebral arteries and branching. There is mild proximal basilar narrowing and severe mid basilar stenosis. The mid basilar stenosis is narrowed from behind by a low-density mural based structure best seen on sagittal reformats-presumably atheromatous plaque. No flap is seen to correlate with previous MRA findings. There is consideration of posterior intramural T1 hyperintense hematoma on the previous MRA, but none seen on the conventional MRI in this location. Confusing right PCA anatomy. 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 There is a at least moderate atheromatous type narrowing of the proximal left P2 segment. Negative for aneurysm or generalized beading. Venous sinuses: Patent Anatomic variants:  See description above. Delayed phase: No abnormal intracranial enhancement. Review of the MIP images confirms the above findings IMPRESSION: 1. Severe mid basilar stenosis. No visible flap or ulceration to correlate with luminal irregularity on MRA yesterday.  2. Intracranial atheromatous irregularity including at least moderate left P2 segment stenosis. 3. Mild atherosclerosis at the cervical carotid bifurcations. No stenosis in the neck. Electronically Signed   By: Monte Fantasia M.D.   On: 10/02/2016 14:48   Mr Brain Wo Contrast  Result Date: 10/01/2016 CLINICAL DATA:  Patient presents with RIGHT arm difficulty for 2 days. Difficulty speaking. EXAM: MRI HEAD WITHOUT CONTRAST TECHNIQUE: Multiplanar, multiecho pulse sequences of the brain and surrounding structures were obtained without intravenous contrast. COMPARISON:  CT head 10/01/2016. FINDINGS: Brain: Restricted diffusion LEFT paramedian pons, consistent with acute infarction. No hemorrhage. The infarct is roughly 1 cm in size. No mass lesion, hydrocephalus or extra-axial fluid. Mild cerebral and cerebellar atrophy, premature for age. T2 and FLAIR hyperintensities in the periventricular and subcortical white matter, also moderately advanced, suggesting chronic microvascular ischemic change, likely secondary to hypertension and diabetes. Vascular: Normal flow voids. Skull and upper cervical spine: Normal marrow signal. Sinuses/Orbits: Negative. Other: None. Compared with earlier CT, the infarct is not visible. IMPRESSION: Acute nonhemorrhagic LEFT pontine brainstem infarct. Premature for age atrophy and chronic microvascular ischemic change. Electronically Signed   By: Staci Righter M.D.   On: 10/01/2016 13:01   Mr Jodene Nam Head/brain EX Cm  Result Date: 10/01/2016 CLINICAL DATA:  Right arm difficulty for 2 days. EXAM: MRA HEAD WITHOUT CONTRAST TECHNIQUE: Angiographic images of the Circle of Willis were obtained using MRA technique without intravenous contrast. COMPARISON:  Brain MRI from earlier today FINDINGS: Mildly larger right ICA, there is a large right posterior communicating artery. Flow artifact in the carotid siphons without suspected flow limiting stenosis. No major branch occlusion or proximal  stenosis. Poor visualization of distal A1 segments is symmetric and likely artifactual from direction of flow. Negative for aneurysm. Symmetric vertebral arteries and branching. Moderate to advanced mid basilar narrowing with apparent linear defect crossing the lumen at the level of the stenosis. No intramural hematoma seen on conventional MRI. 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 Atheromatous type irregularity of left PCA, greatest at the P3 branch. These results were called by telephone at the time of interpretation on 10/01/2016 at 7:43 pm to Dr. Myna Hidalgo, who verbally acknowledged these results. 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 This abnormality is at the level of the patient's acute pontine infarct. Electronically Signed   By: Monte Fantasia M.D.   On: 10/01/2016 19:44    Microbiology: No results found for this or any previous visit (from the past 240 hour(s)).   Labs: Basic Metabolic Panel:  Recent Labs Lab 10/01/16 1029 10/01/16 1830  NA 132*  --   K 3.8  --   CL 98*  --   CO2 23  --   GLUCOSE 390*  --   BUN 13  --   CREATININE 0.69 0.77  CALCIUM 9.2  --    Liver Function Tests:  Recent Labs Lab 10/01/16 1029  AST 27  ALT 43  ALKPHOS 79  BILITOT 1.0  PROT 7.3  ALBUMIN 3.8   No results for input(s): LIPASE, AMYLASE in the last 168 hours. No results for input(s): AMMONIA in the last 168 hours. CBC:  Recent Labs Lab 10/01/16 1029 10/01/16 1830  WBC 8.7 9.1  NEUTROABS  6.4  --   HGB 16.1* 15.0  HCT 44.9 41.5  MCV 87.5 87.0  PLT 242 267   Cardiac Enzymes: No results for input(s): CKTOTAL, CKMB, CKMBINDEX, TROPONINI in the last 168 hours. BNP: BNP (last 3 results) No results for input(s): BNP in the last 8760 hours.  ProBNP (last 3 results) No results for input(s): PROBNP in the last 8760 hours.  CBG:  Recent Labs Lab 10/03/16 0803 10/03/16 1232  10/03/16 1642 10/03/16 2128 10/04/16 0755  GLUCAP 339* 327* 278* 299* 354*       SignedDomenic Polite MD.  [facility] 10/04/2016, 10:37 AM

## 2016-10-04 NOTE — Progress Notes (Signed)
NURSING PROGRESS NOTE  BRITHANY WHITWORTH [id number] Transfer Data: 10/04/2016 4:29 PM Attending Provider: Domenic Polite, MD PCP:Kim, Jeneen Rinks, MD Code Status: FC   Shelley Frazier is a 51 y.o. female patient transferred to [address] via [facility] with personal belongings  -No acute distress noted.  -No complaints of shortness of breath.  -No complaints of chest pain.     Blood pressure (!) 162/81, pulse 77, temperature 98.1 F (36.7 C), temperature source Oral, resp. rate 18, height 5\' 3"  (1.6 m), weight 78.4 kg (172 lb 12.8 oz), last menstrual period 04/18/2013, SpO2 98 %.    Allergies:  Vicodin [hydrocodone-acetaminophen]  Past Medical History:   has a past medical history of Back pain; Diabetes mellitus without complication ([place]); Fatty liver; Gallstones; Hot flashes (07/30/2013); Hypertension; Obesity; and Perimenopausal symptoms (07/30/2013).  Past Surgical History:   has a past surgical history that includes Tubal ligation and Tonsillectomy and adenoidectomy.  Social History:   reports that she has never smoked. She has never used smokeless tobacco. She reports that she does not drink alcohol or use drugs.  Skin: Dry and Intact

## 2016-10-04 NOTE — Progress Notes (Signed)
Pt received at 40 a&ox4 with family at side.  Pt was oriented to room and call bell system. Pt verbalized understanding of admission process.

## 2016-10-04 NOTE — Progress Notes (Signed)
I received insurance approval from [facility] for [place] admission.  I have spoken with Dr. Broadus John who gives medical clearance to admit pt. today to CIR.  I have updated Whitman Hero, [facility] and pt.'s RN Benjamine Mola.  Pt. and husband agreeable.  Please call if questions.  [place] Admissions Coordinator Cell [phone] Office [phone]

## 2016-10-04 NOTE — Progress Notes (Signed)
Inpatient Diabetes Program Recommendations  AACE/ADA: New Consensus Statement on Inpatient Glycemic Control (2015)  Target Ranges:  Prepandial:   less than 140 mg/dL      Peak postprandial:   less than 180 mg/dL (1-2 hours)      Critically ill patients:  140 - 180 mg/dL   Results for Shelley Frazier, Shelley Frazier (MRN [id number]) as of 10/04/2016 15:00  Ref. Range 10/01/2016 18:30  Hemoglobin A1C Latest Ref Range: 4.8 - 5.6 % 13.6 (H)    Review of Glycemic Control:  Results for Shelley Frazier, Shelley Frazier (MRN [id number]) as of 10/04/2016 09:41  Ref. Range 10/03/2016 08:03 10/03/2016 12:32 10/03/2016 16:42 10/03/2016 21:28 10/04/2016 07:55  Glucose-Capillary Latest Ref Range: 65 - 99 mg/dL 339 (H) 327 (H) 278 (H) 299 (H) 354 (H)    Diabetes history: Type 2 diabetes Outpatient Diabetes medications: Metformin 1000 mg bid, Amaryl 2 mg daily with breakfast Current orders for Inpatient glycemic control:  Lantus 30 units q HS, Novolog 5 units tid with meals, Novolog sensitive tid with meals and HS  Inpatient Diabetes Program Recommendations:    Spoke with patient and her husband regarding diabetes.  Patient states that she has had diabetes for approximately a year.  She see's Dr. Maudie Mercury, but states that she has not seen him recently due to scheduling difficulties.  Her husband also has diabetes and was engaged in conversation.  Discussed that A1C indicates that average blood sugars were approximately 350 mg/dL. She endorses symptoms of blurred vision, thirst, tiredness and frequent urination.   Patient tearful and states that she should have followed up.  We discussed insulin and ways to administer.  She asked if she would be a candidate for insulin pump?  Encouraged her to possibly see endocrinologist for these sorts of options.  Briefly discussed insulin pen.  She has both insulin pen starter kit and "Living well with diabetes" booklet at her bedside.  She will be transferring to Rehab today.  Will follow-up with diabetes information  and instruction on insulin pen on 10/05/16.  She did give own injection today with bedside RN.   Thanks, Adah Perl, RN, BC-ADM Inpatient Diabetes Coordinator Pager [phone] (8a-5p)

## 2016-10-05 ENCOUNTER — Encounter (HOSPITAL_COMMUNITY): Payer: Self-pay

## 2016-10-05 ENCOUNTER — Inpatient Hospital Stay (HOSPITAL_COMMUNITY): Payer: BC Managed Care – PPO | Admitting: Occupational Therapy

## 2016-10-05 ENCOUNTER — Inpatient Hospital Stay (HOSPITAL_COMMUNITY): Payer: BC Managed Care – PPO | Admitting: Physical Therapy

## 2016-10-05 ENCOUNTER — Inpatient Hospital Stay (HOSPITAL_COMMUNITY): Payer: BC Managed Care – PPO | Admitting: Speech Pathology

## 2016-10-05 DIAGNOSIS — I10 Essential (primary) hypertension: Secondary | ICD-10-CM

## 2016-10-05 DIAGNOSIS — R740 Nonspecific elevation of levels of transaminase and lactic acid dehydrogenase [LDH]: Secondary | ICD-10-CM

## 2016-10-05 DIAGNOSIS — R7401 Elevation of levels of liver transaminase levels: Secondary | ICD-10-CM

## 2016-10-05 LAB — CBC WITH DIFFERENTIAL/PLATELET
BASOS ABS: 0.1 10*3/uL (ref 0.0–0.1)
BASOS PCT: 1 %
Eosinophils Absolute: 0.1 10*3/uL (ref 0.0–0.7)
Eosinophils Relative: 2 %
HEMATOCRIT: 42 % (ref 36.0–46.0)
HEMOGLOBIN: 14.4 g/dL (ref 12.0–15.0)
Lymphocytes Relative: 33 %
Lymphs Abs: 2.8 10*3/uL (ref 0.7–4.0)
MCH: 30.2 pg (ref 26.0–34.0)
MCHC: 34.3 g/dL (ref 30.0–36.0)
MCV: 88.1 fL (ref 78.0–100.0)
MONOS PCT: 6 %
Monocytes Absolute: 0.5 10*3/uL (ref 0.1–1.0)
NEUTROS ABS: 4.9 10*3/uL (ref 1.7–7.7)
NEUTROS PCT: 58 %
Platelets: 283 10*3/uL (ref 150–400)
RBC: 4.77 MIL/uL (ref 3.87–5.11)
RDW: 12.3 % (ref 11.5–15.5)
WBC: 8.4 10*3/uL (ref 4.0–10.5)

## 2016-10-05 LAB — GLUCOSE, CAPILLARY
Glucose-Capillary: 258 mg/dL — ABNORMAL HIGH (ref 65–99)
Glucose-Capillary: 275 mg/dL — ABNORMAL HIGH (ref 65–99)
Glucose-Capillary: 259 mg/dL — ABNORMAL HIGH (ref 65–99)
Glucose-Capillary: 198 mg/dL — ABNORMAL HIGH (ref 65–99)

## 2016-10-05 LAB — COMPREHENSIVE METABOLIC PANEL
ALBUMIN: 3.7 g/dL (ref 3.5–5.0)
ALK PHOS: 71 U/L (ref 38–126)
ALT: 59 U/L — ABNORMAL HIGH (ref 14–54)
AST: 40 U/L (ref 15–41)
Anion gap: 10 (ref 5–15)
BILIRUBIN TOTAL: 0.9 mg/dL (ref 0.3–1.2)
BUN: 14 mg/dL (ref 6–20)
CALCIUM: 9.3 mg/dL (ref 8.9–10.3)
CO2: 24 mmol/L (ref 22–32)
CREATININE: 0.75 mg/dL (ref 0.44–1.00)
Chloride: 101 mmol/L (ref 101–111)
GFR calc Af Amer: >60 mL/min (ref >60)
GLUCOSE: 267 mg/dL — AB (ref 65–99)
Potassium: 4 mmol/L (ref 3.5–5.1)
Sodium: 135 mmol/L (ref 135–145)
TOTAL PROTEIN: 7.1 g/dL (ref 6.5–8.1)

## 2016-10-05 LAB — PROTHROMBIN GENE MUTATION

## 2016-10-05 LAB — CARDIOLIPIN ANTIBODIES, IGG, IGM, IGA: Anticardiolipin IgA: <9 APL U/mL (ref 0–11)

## 2016-10-05 LAB — PROTEIN C, TOTAL: PROTEIN C, TOTAL: 140 % (ref 60–150)

## 2016-10-05 LAB — FACTOR 5 LEIDEN

## 2016-10-05 MED ORDER — METFORMIN HCL 500 MG PO TABS
500.0000 mg | ORAL_TABLET | Freq: Two times a day (BID) | ORAL | Status: DC
  Administered 2016-10-05 – 2016-10-12 (×14): 500 mg via ORAL
  Filled 2016-10-05 (×14): qty 1

## 2016-10-05 NOTE — Progress Notes (Signed)
Jamse Arn, MD Physician Signed Physical Medicine and Rehabilitation  Consult Note Date of Service: 10/03/2016 10:20 AM  Related encounter: ED to Hosp-Admission (Discharged) from 10/01/2016 in [facility] All Collapse All   [] Hide copied text [] Hover for attribution information      Physical Medicine and Rehabilitation Consult Reason for Consult: Right sided weakness with facial droop and slurred speech Referring Physician: [facility]   HPI: Shelley Frazier is a 51 y.o. right hand female with history of hypertension, diabetes mellitus. Per husband, patient, and chart review patient lives with spouse independent prior to admission and working full time. One level home with 5 steps to entry. Family arranging to provide assistance as needed. Presented 10/01/2016 to [facility] with right-sided weakness and slurred speech. CT/MRI reviewed, showing acute left brainstem infarct. Patient did not receive TPA. 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 CT Angio head and neck showed severe mid basilar stenosis no visible flap or ulceration to correlate with luminal irregularity as noted on MRA. Patient did not receive TPA. Echocardiogram with ejection fraction of 52% grade 1 diastolic dysfunction. Neurology consulted presently on aspirin and Plavix for CVA prophylaxis. Subcutaneous Lovenox for DVT prophylaxis. Tolerating a regular diet. Physical therapy evaluation completed with recommendations of physical medicine rehabilitation consult.   Review of Systems  Constitutional: Negative for chills and fever.  HENT: Negative for hearing loss.   Eyes: Negative for blurred vision and double vision.  Respiratory: Negative for cough and shortness of breath.   Cardiovascular: Negative for chest pain and leg swelling.  Gastrointestinal: Positive for constipation. Negative  for nausea and vomiting.  Genitourinary: Negative for dysuria, flank pain and hematuria.  Musculoskeletal: Positive for myalgias.  Skin: Negative for rash.  Neurological: Positive for sensory change, speech change and focal weakness. Negative for seizures.  All other systems reviewed and are negative.  Past Medical History:  Diagnosis Date  . Back pain   . Diabetes mellitus without complication ([place])   . Fatty liver   . Gallstones   . Hot flashes 07/30/2013  . Hypertension   . Obesity   . Perimenopausal symptoms 07/30/2013        Past Surgical History:  Procedure Laterality Date  . TONSILLECTOMY AND ADENOIDECTOMY    . TUBAL LIGATION          Family History  Problem Relation Age of Onset  . Hypertension Mother   . Gallbladder disease Mother   . Diabetes Father   . Hypertension Father   . Gallbladder disease Maternal Aunt   . Heart disease Paternal Aunt   . Heart disease Paternal Uncle   . Diabetes Maternal Grandfather   . Heart disease Paternal Grandmother   . Diabetes Paternal Grandmother    Social History:  reports that she has never smoked. She has never used smokeless tobacco. She reports that she does not drink alcohol or use drugs. Allergies:      Allergies  Allergen Reactions  . Vicodin [Hydrocodone-Acetaminophen] Nausea And Vomiting         Medications Prior to Admission  Medication Sig Dispense Refill  . COD LIVER OIL PO Take by mouth daily.    Marland Kitchen glimepiride (AMARYL) 2 MG tablet Take 2 mg by mouth daily with breakfast.    . ibuprofen (ADVIL,MOTRIN) 200 MG tablet Take 200 mg by mouth every 6 (six) hours as needed for pain.    Marland Kitchen  lisinopril (PRINIVIL,ZESTRIL) 2.5 MG tablet Take 2.5 mg by mouth daily.    Marland Kitchen lovastatin (MEVACOR) 40 MG tablet Take 40 mg by mouth at bedtime.    . metFORMIN (GLUCOPHAGE) 1000 MG tablet Take 1,000 mg by mouth 2 (two) times daily with a meal.    . naproxen sodium (ANAPROX) 220 MG tablet Take 440 mg by  mouth 2 (two) times daily with a meal.    . Multiple Vitamin (MULTIVITAMIN) tablet Take 1 tablet by mouth daily.      Home: Home Living Family/patient expects to be discharged to:: Private residence Living Arrangements: Spouse/significant other, Children Available Help at Discharge: Family, Available 24 hours/day Type of Home: House Home Access: Stairs to enter [employer] of Steps: 5 Entrance Stairs-Rails: Can reach both, Right, Left Home Layout: One level Bathroom Shower/Tub: Walk-in shower Home Equipment: None  Functional History: Prior Function Level of Independence: Independent Comments: Drives. Works full time doing office work for [employer]. Functional Status:  Mobility: Bed Mobility Overal bed mobility: Needs Assistance Bed Mobility: Supine to Sit, Sit to Supine Supine to sit: Supervision Sit to supine: Min assist General bed mobility comments: Increased time and effort with use of bed rails.  Assist to bring LE's onto bed. Transfers Overall transfer level: Needs assistance Equipment used:  (Holding bedside table for stability) Transfers: Sit to/from Stand Sit to Stand: Min assist General transfer comment: Verbal cues to push up from bed rather than reach for furniture.  Assist to steady during transfer to standing. Ambulation/Gait Ambulation/Gait assistance: Mod assist Ambulation Distance (Feet): 20 Feet Assistive device: 1 person hand held assist Gait Pattern/deviations: Step-to pattern, Decreased stance time - right, Decreased step length - left, Decreased stride length, Decreased dorsiflexion - right, Decreased weight shift to right, Shuffle, Trunk flexed General Gait Details: Patient with decreased Rt knee control in stance, with knee in fixed extended position.  Knee buckles in stance if not locked into extension.  Gait unsteady requiring mod assist for balance/safety. Gait velocity: decreased Gait velocity interpretation: Below normal speed for  age/gender  ADL: ADL Overall ADL's : Needs assistance/impaired Eating/Feeding: Set up, Sitting Eating/Feeding Details (indicate cue type and reason): Pt reports using R hand to feed self with assistance from L hand. Grooming: Minimal assistance, Sitting Upper Body Bathing: Minimal assistance, Sitting Lower Body Bathing: Min guard, Sit to/from stand Upper Body Dressing : Minimal assistance, Sitting Lower Body Dressing: Min guard, Sit to/from stand Toilet Transfer Details (indicate cue type and reason): unable to test due to IV team arriving General ADL Comments: Session focused on R UE strengthening and fine motor coordination for continued education. Educated pt and family concerning position of hand with digits extended in order to facilitate functional positioning for ADL. Pt reports practicing writing and coloring today. Educated pt on importance of incorporating R UE into ADL participation at least as a functional assist.  Cognition: Cognition Overall Cognitive Status: Within Functional Limits for tasks assessed Orientation Level: Oriented X4 Cognition Arousal/Alertness: Awake/alert Behavior During Therapy: WFL for tasks assessed/performed Overall Cognitive Status: Within Functional Limits for tasks assessed  Blood pressure (!) 162/81, pulse 77, temperature 98.1 F (36.7 C), temperature source Oral, resp. rate 18, height 5\' 3"  (1.6 m), weight 78.4 kg (172 lb 12.8 oz), last menstrual period 04/18/2013, SpO2 98 %. Physical Exam  Vitals reviewed. Constitutional: She is oriented to person, place, and time. She appears well-developed and well-nourished.  HENT:  Head: Normocephalic and atraumatic.  Eyes: Conjunctivae and EOM are normal. Right eye exhibits  no discharge. Left eye exhibits no discharge.  Neck: Normal range of motion. Neck supple. No thyromegaly present.  Cardiovascular: Normal rate, regular rhythm and normal heart sounds.   Respiratory: Effort normal and breath sounds  normal. No respiratory distress.  GI: Soft. Bowel sounds are normal. She exhibits no distension.  Musculoskeletal: She exhibits no edema or tenderness.  Neurological: She is alert and oriented to person, place, and time.  Speech is mildly dysarthric but fully intelligible.  Right facial droop Follows all commands.  Motor: RUE/RLE 3+/5 proximal to distal LUE/LLE: 5/5 Sensation intact to light touch DTRs symmetric  Skin: Skin is warm and dry.  Psychiatric: She has a normal mood and affect. Her behavior is normal. Thought content normal.    Lab Results Last 24 Hours       Results for orders placed or performed during the hospital encounter of 10/01/16 (from the past 24 hour(s))  Glucose, capillary     Status: Abnormal   Collection Time: 10/03/16  8:03 AM  Result Value Ref Range   Glucose-Capillary 339 (H) 65 - 99 mg/dL  Glucose, capillary     Status: Abnormal   Collection Time: 10/03/16 12:32 PM  Result Value Ref Range   Glucose-Capillary 327 (H) 65 - 99 mg/dL  Glucose, capillary     Status: Abnormal   Collection Time: 10/03/16  4:42 PM  Result Value Ref Range   Glucose-Capillary 278 (H) 65 - 99 mg/dL  Glucose, capillary     Status: Abnormal   Collection Time: 10/03/16  9:28 PM  Result Value Ref Range   Glucose-Capillary 299 (H) 65 - 99 mg/dL      Imaging Results (Last 48 hours)  Ct Angio Head W Or Wo Contrast  Result Date: 10/02/2016 CLINICAL DATA:  Acute ischemic stroke.  Abnormal MRA. EXAM: CT ANGIOGRAPHY HEAD AND NECK TECHNIQUE: Multidetector CT imaging of the head and neck was performed using the standard protocol during bolus administration of intravenous contrast. Multiplanar CT image reconstructions and MIPs were obtained to evaluate the vascular anatomy. Carotid stenosis measurements (when applicable) are obtained utilizing NASCET criteria, using the distal internal carotid diameter as the denominator. CONTRAST:  50 cc Isovue 370 intravenous COMPARISON:  Brain  MRI and MRA from yesterday FINDINGS: CT HEAD FINDINGS Brain: The known acute left pontine infarct is largely obscured by streak artifact. No evidence of interval infarct or hemorrhage. No hydrocephalus. Vascular: See below Skull: No acute or aggressive finding Sinuses: Negative Orbits: Negative Review of the MIP images confirms the above findings CTA NECK FINDINGS Aortic arch: Unremarkable.  Three vessel branching. Right carotid system: Mild atheromatous wall thickening at the common carotid bifurcation, mixed density. No stenosis or ulceration noted. Negative for dissection or beading. Left carotid system: Mild calcified and noncalcified atheromatous wall thickening at the common carotid bifurcation without stenosis, ulceration, or beading. Vertebral arteries: No proximal subclavian stenosis. Codominant vertebral arteries that are smooth and widely patent to the dura. Skeleton: No acute or aggressive finding Other neck: Negative Upper chest: Negative Review of the MIP images confirms the above findings CTA HEAD FINDINGS Anterior circulation: There is atheromatous irregularity of bilateral carotid siphons with mild narrowing, borderline moderate at the left anterior genu. There is no major branch occlusion. Mild atheromatous type irregularity of bilateral MCA branches. Negative for aneurysm. Posterior circulation: Symmetric vertebral arteries and branching. There is mild proximal basilar narrowing and severe mid basilar stenosis. The mid basilar stenosis is narrowed from behind by a low-density mural based structure best  seen on sagittal reformats-presumably atheromatous plaque. No flap is seen to correlate with previous MRA findings. There is consideration of posterior intramural T1 hyperintense hematoma on the previous MRA, but none seen on the conventional MRI in this location. Confusing right PCA anatomy. 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 There is a at  least moderate atheromatous type narrowing of the proximal left P2 segment. Negative for aneurysm or generalized beading. Venous sinuses: Patent Anatomic variants: See description above. Delayed phase: No abnormal intracranial enhancement. Review of the MIP images confirms the above findings IMPRESSION: 1. Severe mid basilar stenosis. No visible flap or ulceration to correlate with luminal irregularity on MRA yesterday. 2. Intracranial atheromatous irregularity including at least moderate left P2 segment stenosis. 3. Mild atherosclerosis at the cervical carotid bifurcations. No stenosis in the neck. Electronically Signed   By: Monte Fantasia M.D.   On: 10/02/2016 14:48   Ct Angio Neck W Or Wo Contrast  Result Date: 10/02/2016 CLINICAL DATA:  Acute ischemic stroke.  Abnormal MRA. EXAM: CT ANGIOGRAPHY HEAD AND NECK TECHNIQUE: Multidetector CT imaging of the head and neck was performed using the standard protocol during bolus administration of intravenous contrast. Multiplanar CT image reconstructions and MIPs were obtained to evaluate the vascular anatomy. Carotid stenosis measurements (when applicable) are obtained utilizing NASCET criteria, using the distal internal carotid diameter as the denominator. CONTRAST:  50 cc Isovue 370 intravenous COMPARISON:  Brain MRI and MRA from yesterday FINDINGS: CT HEAD FINDINGS Brain: The known acute left pontine infarct is largely obscured by streak artifact. No evidence of interval infarct or hemorrhage. No hydrocephalus. Vascular: See below Skull: No acute or aggressive finding Sinuses: Negative Orbits: Negative Review of the MIP images confirms the above findings CTA NECK FINDINGS Aortic arch: Unremarkable.  Three vessel branching. Right carotid system: Mild atheromatous wall thickening at the common carotid bifurcation, mixed density. No stenosis or ulceration noted. Negative for dissection or beading. Left carotid system: Mild calcified and noncalcified atheromatous  wall thickening at the common carotid bifurcation without stenosis, ulceration, or beading. Vertebral arteries: No proximal subclavian stenosis. Codominant vertebral arteries that are smooth and widely patent to the dura. Skeleton: No acute or aggressive finding Other neck: Negative Upper chest: Negative Review of the MIP images confirms the above findings CTA HEAD FINDINGS Anterior circulation: There is atheromatous irregularity of bilateral carotid siphons with mild narrowing, borderline moderate at the left anterior genu. There is no major branch occlusion. Mild atheromatous type irregularity of bilateral MCA branches. Negative for aneurysm. Posterior circulation: Symmetric vertebral arteries and branching. There is mild proximal basilar narrowing and severe mid basilar stenosis. The mid basilar stenosis is narrowed from behind by a low-density mural based structure best seen on sagittal reformats-presumably atheromatous plaque. No flap is seen to correlate with previous MRA findings. There is consideration of posterior intramural T1 hyperintense hematoma on the previous MRA, but none seen on the conventional MRI in this location. Confusing right PCA anatomy. 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 There is a at least moderate atheromatous type narrowing of the proximal left P2 segment. Negative for aneurysm or generalized beading. Venous sinuses: Patent Anatomic variants: See description above. Delayed phase: No abnormal intracranial enhancement. Review of the MIP images confirms the above findings IMPRESSION: 1. Severe mid basilar stenosis. No visible flap or ulceration to correlate with luminal irregularity on MRA yesterday. 2. Intracranial atheromatous irregularity including at least moderate left  P2 segment stenosis. 3. Mild atherosclerosis at the cervical carotid bifurcations. No stenosis in the neck. Electronically Signed   By: Monte Fantasia M.D.    On: 10/02/2016 14:48     Assessment/Plan: Diagnosis: Acute left pontine infarct Labs and images independently reviewed.  Records reviewed and summated above. Stroke: Continue secondary stroke prophylaxis and Risk Factor Modification listed below:   Antiplatelet therapy:   Blood Pressure Management:  Continue current medication with prn's with permisive HTN per primary team Statin Agent:   Diabetes management:   Right sided hemiparesis Motor recovery: Fluoxetine  1. Does the need for close, 24 hr/day medical supervision in concert with the patient's rehab needs make it unreasonable for this patient to be served in a less intensive setting? Yes 2. Co-Morbidities requiring supervision/potential complications: hypertension (monitor and provide prns in accordance with increased physical exertion and pain), diabetes mellitus, currently uncontrolled (Monitor in accordance with exercise and adjust meds as necessary), diastolic CHF (monitor for signs/symptoms of fluid overload), HLD (cont meds) 3. Due to safety, disease management and patient education, does the patient require 24 hr/day rehab nursing? Yes 4. Does the patient require coordinated care of a physician, rehab nurse, PT (1-2 hrs/day, 5 days/week), OT (1-2 hrs/day, 5 days/week) and SLP (1-2 hrs/day, 5 days/week) to address physical and functional deficits in the context of the above medical diagnosis(es)? Yes Addressing deficits in the following areas: balance, endurance, locomotion, strength, transferring, bathing, dressing, toileting, speech and psychosocial support 5. Can the patient actively participate in an intensive therapy program of at least 3 hrs of therapy per day at least 5 days per week? Yes 6. The potential for patient to make measurable gains while on inpatient rehab is excellent 7. Anticipated functional outcomes upon discharge from inpatient rehab are modified independent and supervision  with PT, modified independent and  supervision with OT, independent and modified independent with SLP. 8. Estimated rehab length of stay to reach the above functional goals is: 10-15 days. 9. Does the patient have adequate social supports and living environment to accommodate these discharge functional goals? Yes 10. Anticipated D/C setting: Home 11. Anticipated post D/C treatments: HH therapy and Home excercise program 12. Overall Rehab/Functional Prognosis: good  RECOMMENDATIONS: This patient's condition is appropriate for continued rehabilitative care in the following setting: CIR Patient has agreed to participate in recommended program. Yes Note that insurance prior authorization may be required for reimbursement for recommended care.  Comment: Rehab Admissions Coordinator to follow up.  Delice Lesch, MD, Mellody Drown Cathlyn Parsons., PA-C 10/04/2016    Revision History                        Routing History

## 2016-10-05 NOTE — Care Management Note (Signed)
[facility] Individual Statement of Services  Patient Name:  Shelley Frazier  Date:  10/05/2016  Welcome to the [facility].  Our goal is to provide you with an individualized program based on your diagnosis and situation, designed to meet your specific needs.  With this comprehensive rehabilitation program, you will be expected to participate in at least 3 hours of rehabilitation therapies Monday-Friday, with modified therapy programming on the weekends.  Your rehabilitation program will include the following services:  Physical Therapy (PT), Occupational Therapy (OT), Speech Therapy (ST), 24 hour per day rehabilitation nursing, Therapeutic Recreaction (TR), Neuropsychology, Case Management (Social Worker), Rehabilitation Medicine, Nutrition Services and Pharmacy Services  Weekly team conferences will be held on Wednesday to discuss your progress.  Your Social Worker will talk with you frequently to get your input and to update you on team discussions.  Team conferences with you and your family in attendance may also be held.  Expected length of stay: 9-12 days Overall anticipated outcome: supervision-mod/I level  Depending on your progress and recovery, your program may change. Your Social Worker will coordinate services and will keep you informed of any changes. Your Social Worker's name and contact numbers are listed  below.  The following services may also be recommended but are not provided by the [facility] will be made to provide these services after discharge if needed.  Arrangements include referral to agencies that provide these services.  Your insurance has been verified to be:  [employer] Your primary doctor is:  Jani Gravel  Pertinent information will be shared with your doctor  and your insurance company.  Social Worker:  Ovidio Kin, [place] or ([phone]  Information discussed with and copy given to patient by: Elease Hashimoto, 10/05/2016, 12:32 PM

## 2016-10-05 NOTE — Evaluation (Signed)
Occupational Therapy Assessment and Plan  Patient Details  Name: Shelley Frazier MRN: [id number] Date of Birth: Apr 09, 1966  OT Diagnosis: hemiplegia affecting dominant side and muscle weakness (generalized) Rehab Potential: Rehab Potential (ACUTE ONLY): Excellent ELOS: 7-9 days   Today's Date: 10/05/2016 OT Individual Time: 0905-1005 OT Individual Time Calculation (min): 60 min     Problem List:  Patient Active Problem List   Diagnosis Date Noted  . Benign essential HTN   . Transaminitis   . Acute ischemic stroke ([place])   . Hyperglycemia   . Chronic diastolic congestive heart failure ([place])   . Right hemiparesis ([place])   . Dysarthria, post-stroke   . Poorly controlled diabetes mellitus ([place])   . Dyslipidemia   . Mixed hyperlipidemia   . Basilar artery stenosis   . 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 DM (diabetes mellitus), type 2 with neurological complications ([place]) 01/74/9449  . Hot flashes 07/30/2013  . Hypertension 07/30/2013  . Perimenopausal symptoms 07/30/2013    Past Medical History:  Past Medical History:  Diagnosis Date  . Back pain   . Diabetes mellitus without complication ([place])   . Fatty liver   . Gallstones   . Hot flashes 07/30/2013  . Hypertension   . Obesity   . Perimenopausal symptoms 07/30/2013   Past Surgical History:  Past Surgical History:  Procedure Laterality Date  . TONSILLECTOMY AND ADENOIDECTOMY    . TUBAL LIGATION      Assessment & Plan Clinical Impression: Patient is a 51 y.o. year old female with recent admission to the hospital on 10/01/16 with history of right sided weakness and slurred speech that started the night before. MRI brain done revealing "Acute nonhemorrhagic LEFT pontine brainstem infarct. 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  Patient transferred to CIR on  10/04/2016 .    Patient currently requires min with basic self-care skills and IADL secondary to muscle weakness, impaired timing and sequencing, unbalanced muscle activation and decreased coordination and decreased standing balance, hemiplegia and decreased balance strategies.  Prior to hospitalization, patient could complete ADLs with independent .  Patient will benefit from skilled intervention to decrease level of assist with basic self-care skills, increase independence with basic self-care skills and increase level of independence with iADL prior to discharge home with care partner.  Anticipate patient will require 24 hour supervision and follow up outpatient.  OT - End of Session Activity Tolerance: Tolerates 30+ min activity with multiple rests Endurance Deficit: Yes OT Assessment Rehab Potential (ACUTE ONLY): Excellent OT Patient demonstrates impairments in the following area(s): Balance;Motor;Sensory OT Basic ADL's Functional Problem(s): Eating;Grooming;Bathing;Dressing;Toileting OT Advanced ADL's Functional Problem(s): Simple Meal Preparation OT Transfers Functional Problem(s): Toilet;Tub/Shower OT Additional Impairment(s): Fuctional Use of Upper Extremity OT Plan OT Intensity: Minimum of 1-2 x/day, 45 to 90 minutes OT Frequency: 5 out of 7 days OT Duration/Estimated Length of Stay: 7-9 days OT Treatment/Interventions: Balance/vestibular training;Discharge planning;DME/adaptive equipment instruction;Neuromuscular re-education;Patient/family education;Therapeutic Activities;Self Care/advanced ADL retraining;UE/LE Coordination activities;Functional mobility training;Community reintegration;UE/LE Strength taining/ROM OT Self Feeding Anticipated Outcome(s): modified independent OT Basic Self-Care Anticipated Outcome(s): modified independent OT Toileting Anticipated Outcome(s): modified independent OT Bathroom Transfers Anticipated Outcome(s): modified independent OT  Recommendation Patient destination: Home Follow Up Recommendations: Outpatient OT Equipment Recommended: To be determined   Skilled Therapeutic Intervention Pt began working on selfcare retraining sit to stand shower level.  Min assist for ambulation in  and out of the shower without assistive device.  Decreased strength and coordination noted in the RLE with weightbearing and advancement of the RLE.  Integration of the RUE for bathing with min assist to wash the LUE as well as for applying deodorant.  She attempted integration of the hand as well for donning socks and all clothing.  Educated pt's spouse on safe assist with toilet transfers using the RW with hand splint on the right.   Also educated pt on AROM/AAROM exercises for the right shoulder, elbow, wrist, and digits.  Discussed ELOS and expected goals of modified independent.  Pt left in bedside chair with her spouse present to conclude session.    OT Evaluation Precautions/Restrictions  Precautions Precautions: Fall Precaution Comments: R hemi Restrictions Weight Bearing Restrictions: No  Pain Pain Assessment Pain Assessment: No/denies pain Home Living/Prior Functioning Home Living Family/patient expects to be discharged to:: Private residence Living Arrangements: Spouse/significant other, Children Available Help at Discharge: Family, Available 24 hours/day Type of Home: House Home Access: Stairs to enter [employer] of Steps: 5 Entrance Stairs-Rails: Can reach both, Right, Left Home Layout: One level Bathroom Shower/Tub: Multimedia programmer: Standard Bathroom Accessibility: Yes Additional Comments: husband will be taking time off work to care for her.   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  ADL  See Function section of chart for details  Vision/Perception  Vision- Assessment Eye Alignment: Within Functional Limits Ocular Range of Motion: Within Functional Limits Tracking/Visual Pursuits: Able to track stimulus in all quads without difficulty Perception Perception: Within Functional Limits Praxis Praxis: Intact  Cognition Overall Cognitive Status: Within Functional Limits for tasks assessed Arousal/Alertness: Awake/alert Orientation Level: Person;Place;Situation Person: Oriented Place: Oriented Situation: Oriented Year: 2018 Month: May Day of Week: Correct Memory: Appears intact Immediate Memory Recall: Sock;Blue;Bed Memory Recall: Sock;Blue;Bed Memory Recall Sock: Without Cue Memory Recall Blue: Without Cue Memory Recall Bed: Without Cue Attention: Selective Focused Attention: Appears intact Sustained Attention: Appears intact Awareness: Appears intact Problem Solving: Appears intact Reasoning: Appears intact Self Monitoring: Appears intact Self Correcting: Appears intact Safety/Judgment: Appears intact Sensation Sensation Light Touch: Impaired Detail Light Touch Impaired Details: Impaired RUE (numbness in the right distal index finger) Stereognosis: Not tested Hot/Cold: Not tested Proprioception: Appears Intact Coordination Gross Motor Movements are Fluid and Coordinated: No Fine Motor Movements are Fluid and Coordinated: No Coordination and Movement Description: Pt with min assist needed for integration of the RUE as an active assist for selfcare and dressing tasks.  Decreased efficiency with holding onto the walker securely during mobility. Motor  Motor Motor: Hemiplegia Motor - Skilled Clinical Observations: decreased strength/coordination through Rt UE/LE.   Mobility  Transfers Transfers: Sit to Stand;Stand to Sit Sit to Stand: 4: Min guard;With upper extremity assist;From bed Stand to Sit: 4: Min guard;With upper extremity assist;To bed  Trunk/Postural Assessment  Cervical Assessment Cervical Assessment: Exceptions to WFL (slight head tilt to the right in sitting at rest) Thoracic Assessment Thoracic Assessment: Within Functional Limits Lumbar Assessment Lumbar Assessment: Within Functional Limits Postural Control Postural Control: Within Functional Limits  Balance Balance Balance Assessed: Yes Static Sitting Balance Static Sitting - Balance Support: No upper extremity supported Static Sitting -  Level of Assistance: 7: Independent Dynamic Sitting Balance Dynamic Sitting - Balance Support: No upper extremity supported;During functional activity Dynamic Sitting - Level of Assistance: 5: Stand by assistance Static Standing Balance Static Standing - Balance Support: Right upper extremity supported;During functional activity Static Standing - Level of Assistance: 5: Stand by assistance Dynamic Standing Balance Dynamic Standing - Balance Support: During functional activity;No upper extremity supported Dynamic Standing - Level of Assistance: 4: Min assist Extremity/Trunk Assessment RUE Assessment RUE Assessment: Exceptions to [facility] RUE Strength RUE Overall Strength Comments: Pt exhibits Brunnstrum stage V movement in the arm and hand with isolated elbow, wrist, and digit movements, however slight synergy pattern still present with shoulder flexion above 70 degrees.  AAROM with shoulder flexion WFLS.  She is able to demonstrate full digit flexion and extension with opposition of thumb to all digits except for the 5th.    LUE Assessment LUE Assessment: Within Functional Limits   See Function Navigator for Current Functional Status.   Refer to Care Plan for Long Term Goals  Recommendations for other services: Neuropsych   Discharge  Criteria: Patient will be discharged from OT if patient refuses treatment 3 consecutive times without medical reason, if treatment goals not met, if there is a change in medical status, if patient makes no progress towards goals or if patient is discharged from hospital.  The above assessment, treatment plan, treatment alternatives and goals were discussed and mutually agreed upon: by patient and by family  Nickisha Hum OTR/L 10/05/2016, 4:32 PM

## 2016-10-05 NOTE — Evaluation (Signed)
Speech Language Pathology Assessment and Plan  Patient Details  Name: Shelley Frazier MRN: [id number] Date of Birth: 08/07/1965  SLP Diagnosis: Dysarthria;Other (comment) (written expression deficits. )  Rehab Potential: Excellent ELOS: 7-9 days    Today's Date: 10/05/2016 SLP Individual Time: 1300-1400 SLP Individual Time Calculation (min): 60 min   Problem List:  Patient Active Problem List   Diagnosis Date Noted  . Benign essential HTN   . Transaminitis   . Acute ischemic stroke ([place])   . Hyperglycemia   . Chronic diastolic congestive heart failure ([place])   . Right hemiparesis ([place])   . Dysarthria, post-stroke   . Poorly controlled diabetes mellitus ([place])   . Dyslipidemia   . Mixed hyperlipidemia   . Basilar artery stenosis   . 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 DM (diabetes mellitus), type 2 with neurological complications ([place]) 71/21/9758  . Hot flashes 07/30/2013  . Hypertension 07/30/2013  . Perimenopausal symptoms 07/30/2013   Past Medical History:  Past Medical History:  Diagnosis Date  . Back pain   . Diabetes mellitus without complication ([place])   . Fatty liver   . Gallstones   . Hot flashes 07/30/2013  . Hypertension   . Obesity   . Perimenopausal symptoms 07/30/2013   Past Surgical History:  Past Surgical History:  Procedure Laterality Date  . TONSILLECTOMY AND ADENOIDECTOMY    . TUBAL LIGATION       Assessment / Plan / Recommendation Clinical Impression  Mrs. Magnussen admitted to [facility] on 10/01/16 with history of right sided weakness and slurred speech that started the night before. MRI brain done revealing "Acute nonhemorrhagic LEFT pontine brainstem infarct. 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 CTA brain/neck showed severe mid basilar stenosis and no dissectionMrs. Sarabia admitted to [facility] on 10/[facility]/16 with history of right sided weakness and slurred  speech that started the night before. MRI brain done revealing "Acute nonhemorrhagic LEFT pontine brainstem infarct. 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 Ms. Dickie was seen for both a Clinical Swallow Evaluation at the Bedside and a Speech-Language Evaluation. Pt was admitted with L brainstem infarct. She has a hx of hypertension, Diabetes type 2, back pain and obesity. She lives with her spouse in a one level house with 5 stairs. 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 Her job consists of computer work, including document writing, ordering, answering phones and relaying messages. Her home duties include cooking, cleaning, bill paying, laundry, driving, and med management. Clinical Swallow Eval was completed utilizing thin liquids, puree and solid consistencies. Oral motor exam revealed R side weakness and asymmetry, but adequate function. Oral phase of swallow was characterized by adequate mastication and bolus propulsion across textures with no noted residue. Pharyngeal phase of swallow was characterized by adequate laryngeal elevation and timely swallow trigger across textures. There were no overt signs or symptoms of aspiration with any on the tested consistencies. Pt denies any reflux symptoms. Recommendations for diet include: continue regular consistency diet. Pt may have liquids of choice. Give meds whole with liquid one at a time. Pt was also seen for a speech-language evaluation. The Western Aphasia Battery was given (Bedside edition) with pt scoring 100% in both areas of aphasia and language indicating no impairment. Memory (immediate and short term), word fluentcy, calculation, and abstract portions  of the [facility] were given with 100% accuracy indicating no impairment. However, pt does have mild - moderate dysarthria which reduces her  overall intelligibility. She is aware this will possibly affect her ability to return to her job. Initiated cognitive intervention during session to address written communication.Pt would benefit from continued speech therapy to address goals of improved intelligibility and over all oral motor strengthening. She would also benefit from some written language expression treatment given her desire to return to work. Speech therapy will follow pt during her inpt rehab stay.   Skilled Therapeutic Interventions          See above  SLP Assessment  Patient will need skilled Speech Lanaguage Pathology Services during CIR admission    Recommendations  SLP Diet Recommendations: Age appropriate regular solids;Thin Liquid Administration via: Cup;Straw Medication Administration: Whole meds with liquid Supervision: Patient able to self feed (tray set up required) Patient destination: Home Follow up Recommendations: Home Health SLP;Outpatient SLP Equipment Recommended: None recommended by SLP    SLP Frequency 3 to 5 out of 7 days   SLP Duration  SLP Intensity  SLP Treatment/Interventions 7-9 days  Minumum of 1-2 x/day, 30 to 90 minutes (30 min 5 x wk)  Functional tasks;Medication managment;Oral motor exercises;Environmental controls    Pain Pain Assessment Pain Assessment: No/denies pain  Prior Functioning Cognitive/Linguistic Baseline: Within functional limits Type of Home: House  Lives With: Spouse Education: pt completed high school and 1 1/2 years of college Vocation: Full time employment (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  Problem Solving Problem solving assist level: Solves complex problems: Recognizes & self-corrects  Memory Memory assist level: Complete Independence: No helper   Short Term Goals: Week 1: SLP Short Term Goal 1 (Week 1): short term goals to equal long term goals due to pt's anticipated length of stay.   Refer to Care Plan for Long Term Goals  Recommendations for other services: None   Discharge Criteria: Patient will be discharged from SLP if patient refuses treatment 3 consecutive times without medical reason, if treatment goals not met, if there is a change in medical status, if patient makes no progress towards goals or if patient is discharged from hospital.  The above assessment, treatment plan, treatment alternatives and goals were discussed and mutually agreed upon: by patient  Darlina Sicilian 10/05/2016, 4:08 PM

## 2016-10-05 NOTE — IPOC Note (Signed)
Overall Plan of Care [facility]) [name] MRN: [id number] DOB: 06/17/1965  Admitting Diagnosis: L Pontine CVA  Hospital Problems: Active Problems:   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[place])   Right hemiparesis ([place])   Dysarthria, post-stroke   Poorly controlled diabetes mellitus ([place])   Dyslipidemia   Benign essential HTN   Transaminitis     Functional Problem List: Nursing Endurance, Medication Management, Motor, Nutrition, Pain, Sensory  PT Balance, Endurance, Motor  OT Balance, Motor, Sensory  SLP Motor  TR         Basic ADL's: OT Eating, Grooming, Bathing, Dressing, Toileting     Advanced  ADL's: OT Simple Meal Preparation     Transfers: PT Bed Mobility, Bed to Chair, Car, Furniture, Floor  OT Toilet, Tub/Shower     Locomotion: PT Ambulation, Emergency planning/management officer, Stairs     Additional Impairments: OT Fuctional Use of Upper Extremity  SLP        TR      Anticipated Outcomes Item Anticipated Outcome  Self Feeding modified independent  Swallowing  excellent   Basic self-care  modified independent  Toileting  modified independent   Bathroom Transfers modified independent  Bowel/Bladder  Mod I  Transfers  supervision-mod I   Locomotion  ambulatory with LRAD  Communication  excellent  Cognition  excellent  Pain  <2  Safety/Judgment  Mod I   Therapy Plan: PT Intensity: Minimum of 1-2 x/day ,45 to 90 minutes PT Frequency: 5 out of 7 days PT Duration Estimated Length of Stay: 9-12 OT Intensity: Minimum of 1-2 x/day, 45 to 90 minutes OT Frequency: 5 out of 7 days OT Duration/Estimated Length of Stay: 7-9 days SLP Intensity: Minumum of 1-2 x/day, 30 to 90 minutes (30 min 5 x wk) SLP Frequency: 3 to 5 out of 7 days SLP Duration/Estimated Length of Stay: 7-9 days       Team Interventions: Nursing Interventions Patient/Family Education, Disease Management/Prevention, Pain Management, Medication Management, Discharge Planning  PT  interventions Ambulation/gait training, Training and development officer, Community reintegration, Discharge planning, Disease management/prevention, DME/adaptive equipment instruction, Functional electrical stimulation, Functional mobility training, Neuromuscular re-education, Patient/family education, Psychosocial support, Splinting/orthotics, Stair training, Therapeutic Activities, Therapeutic Exercise, UE/LE Strength taining/ROM, UE/LE Coordination activities, Wheelchair propulsion/positioning  OT Interventions Training and development officer, Discharge planning, DME/adaptive equipment instruction, Neuromuscular re-education, Patient/family education, Therapeutic Activities, Self Care/advanced ADL retraining, UE/LE Coordination activities, Functional mobility training, Community reintegration, UE/LE Strength taining/ROM  SLP Interventions Functional tasks, Medication managment, Oral motor exercises, Environmental controls  TR Interventions    SW/CM Interventions Discharge Planning, Barrister's clerk, Patient/Family Education    Team Discharge Planning: Destination: PT-Home ,OT- Home , SLP-Home Projected Follow-up: PT-Home health PT, 24 hour supervision/assistance, OT-  Outpatient OT, SLP-Home Health SLP, Outpatient SLP Projected Equipment Needs: PT-To be determined, OT- To be determined, SLP-None recommended by SLP Equipment Details: PT- , OT-  Patient/family involved in discharge planning: PT- Patient,  OT- , SLP-Patient  MD ELOS: 8-11 days. Medical Rehab Prognosis:  Good Assessment: 51 y.o.right hand femalewith history of HTN, T2DM, fatty liver who was admitted to [facility] on 10/01/16 with history of right sided weakness and slurred speech that started the night before. MRI brain done revealing "Acute nonhemorrhagic LEFT pontine brainstem infarct. 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 CTA brain/neck showed  severe mid basilar stenosis and no dissection. Dr. Erlinda Hong recommended DAPT for 3 months followed by plavix alone.  Pt with resulting functional limitations with  mobility, endurance, cognition. Will set goals for Mod I with PT/OT/SLP.   See Team Conference Notes for weekly updates to the plan of care

## 2016-10-05 NOTE — Progress Notes (Signed)
Shelley Frazier Rehab Admission Coordinator Signed Physical Medicine and Rehabilitation  PMR Pre-admission Date of Service: 10/04/2016 12:20 PM  Related encounter: ED to Hosp-Admission (Discharged) from 10/01/2016 in [facility]       _0 Hide copied text PMR Admission Coordinator Pre-Admission Assessment  Patient: Shelley Frazier is an 51 y.o., female MRN: [id number] DOB: [birth year] Height: _1  (160 cm) Weight: 78.4 kg (172 lb 12.8 oz)                                                                                                                                                  Insurance Information HMO:      PPO:  X     PCP:      IPA:      80/20:      OTHER:  PRIMARY:  BCBS State      Policy#:  [id number]      Subscriber:  self CM Name:  Levy Pupa     Phone#:  [phone]     Fax#: [phone] Pre-Cert#:  [id number], approved 10/04/16-10/14/16 with clinical updates due 10/14/16      Employer:  [employer] Benefits:  Phone #:  [phone]     Name:  Blanchard Kelch. Date:  05/31/16     Deduct:  $1250      Out of Pocket Max:  $4350      Life Max: n/a CIR:  80%/20%      SNF:  80%/20% Outpatient:  80%/20%     Co-Pay:  Home Health:  80%/20%      Co-Pay:   DME:  80%     Co-Pay: 20% Providers:  In network SECONDARY:       Policy#:       Subscriber:  CM Name:       Phone#:      Fax#:  Pre-Cert#:       Employer:  Benefits:  Phone #:      Name:  Eff. Date:      Deduct:       Out of Pocket Max:       Life Max:  CIR:       SNF:  Outpatient:      Co-Pay:  Home Health:       Co-Pay:  DME:      Co-Pay:   Medicaid Application Date:       Case Manager:  Disability Application Date:       Case Worker:   Emergency Contact Information        Contact Information    Name Relation Home Work [place] [address] [place]  [phone]     Current Medical History  Patient Admitting Diagnosis: Acute left pontine infarct History of Present Illness: Shelley Frazier a 51 y.o.right hand femalewith history of hypertension, diabetes  mellitus. Per husband, patient, and chart review patient lives with spouse as well as 2 adult children/independent prior to admissionand working full time. One level home with 5 steps to entry.Family arranging to provide assistance as needed.Presented 10/01/2016 to [facility] with right-sided weakness and slurred speech. CT/MRI reviewed, showing acute left brainstem infarct. Patient did not receive TPA. 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 CT Angio head and neck showed severe mid basilar stenosis no visible flap or ulceration to correlate with luminal irregularity as noted on MRA. Patient did not receive TPA. Echocardiogram with ejection fraction of 26% grade 1 diastolic dysfunction. Neurology consulted presently on aspirin and Plavix for CVA prophylaxis3 months then Plavix alone. Subcutaneous Lovenox for DVT prophylaxis. Tolerating a regular diet. Physical and occupational therapy evaluationscompleted with recommendations of physical medicine rehabilitation consult.Patient was admitted for a comprehensive rehabilitation program  Total: 3  NIH  Past Medical History      Past Medical History:  Diagnosis Date  . Back pain   . Diabetes mellitus without complication ([place])   . Fatty liver   . Gallstones   . Hot flashes 07/30/2013  . Hypertension   . Obesity   . Perimenopausal symptoms 07/30/2013    Family History  family history includes Diabetes in her father, maternal grandfather, and paternal grandmother; Gallbladder disease in her maternal aunt and mother; Heart disease in her paternal aunt, paternal grandmother, and paternal uncle; Hypertension in her father and mother.  Prior Rehab/Hospitalizations:  Has the patient had major surgery during 100 days prior to admission? No  Current Medications    Current Facility-Administered Medications:  .  acetaminophen (TYLENOL) tablet 650 mg, 650 mg, Oral, Q4H PRN, 650 mg at 10/03/16 2035 **OR** acetaminophen (TYLENOL) solution 650 mg, 650 mg, Per Tube, Q4H PRN **OR** acetaminophen (TYLENOL) suppository 650 mg, 650 mg, Rectal, Q4H PRN, Isaac Bliss, Rayford Halsted, MD .  aspirin suppository 300 mg, 300 mg, Rectal, Daily **OR** aspirin tablet 325 mg, 325 mg, Oral, Daily, Isaac Bliss, Rayford Halsted, MD, 325 mg at 10/04/16 [phone] .  atorvastatin (LIPITOR) tablet 80 mg, 80 mg, Oral, q1800, Rosalin Hawking, MD, 80 mg at 10/03/16 1716 .  clopidogrel (PLAVIX) tablet 75 mg, 75 mg, Oral, Daily, Darrel Reach, MD, 75 mg at 10/04/16 4562 .  cyclobenzaprine (FLEXERIL) tablet 5 mg, 5 mg, Oral, TID PRN, Domenic Polite, MD .  enoxaparin (LOVENOX) injection 40 mg, 40 mg, Subcutaneous, Q24H, Isaac Bliss, Rayford Halsted, MD, 40 mg at 10/03/16 1717 .  feeding supplement (ENSURE ENLIVE) (ENSURE ENLIVE) liquid 237 mL, 237 mL, Oral, Q24H, Domenic Polite, MD, 237 mL at 10/04/16 [phone] .  insulin aspart (novoLOG) injection 0-5 Units, 0-5 Units, Subcutaneous, QHS, Isaac Bliss, Rayford Halsted, MD, 3 Units at 10/03/16 2159 .  insulin aspart (novoLOG) injection 0-9 Units, 0-9 Units, Subcutaneous, TID WC, Isaac Bliss, Rayford Halsted, MD, 9 Units at 10/04/16 [phone] .  insulin aspart (novoLOG) injection 5 Units, 5 Units, Subcutaneous, TID WC, Domenic Polite, MD, 5 Units at 10/04/16 [phone] .  insulin glargine (LANTUS) injection 30 Units, 30 Units, Subcutaneous, QHS, Domenic Polite, MD, 30 Units at 10/03/16 2200 .  insulin starter kit- pen needles (English) 1 kit, 1 kit, Other, Once, Domenic Polite, MD .  living well with diabetes book MISC, , Does not apply, Once, Domenic Polite, MD .  multivitamin with minerals tablet 1 tablet, 1 tablet, Oral, Daily, Isaac Bliss, Rayford Halsted, MD, 1 tablet at 10/04/16  5 .  senna-docusate (Senokot-S) tablet 1 tablet, 1 tablet, Oral, QHS PRN,  Isaac Bliss, Rayford Halsted, MD  Patients Current Diet: Diet heart healthy/carb modified Room service appropriate? 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 None of the above  Prior Functional Level Prior Function Level of Independence: Independent Comments: Drives. Works full time doing office work for [employer].  Self Care: Did the patient need help bathing, dressing, using the toilet or eating?  Independent  Indoor Mobility: Did the patient need assistance with walking from room to room (with or without device)? Independent  Stairs: Did the patient need assistance with internal or external stairs (with or without device)? Independent  Functional Cognition: Did the patient need help planning regular tasks such as shopping or remembering to take medications? Independent  Current Functional Level Cognition  Arousal/Alertness: Awake/alert Overall Cognitive Status: Within Functional Limits for tasks assessed Orientation Level: Oriented X4 Attention: Sustained Sustained Attention: Appears intact Memory: Appears intact Awareness: Appears intact Problem Solving: Appears intact Safety/Judgment: Appears intact    Extremity Assessment (includes Sensation/Coordination)  Upper Extremity Assessment: Defer to OT evaluation RUE Deficits / Details: Decreased strength: shoulder 4/5, elbow flexion/extension  3/5, grasp 3-/5. Decreased fine motor coordination. Hand brunnstrom level V. RUE Coordination: decreased fine motor, decreased gross motor  Lower Extremity Assessment: RLE deficits/detail RLE Deficits / Details: Hip flexion, knee flex/ext and DF at 4-/5 RLE Coordination: decreased gross motor    ADLs  Overall ADL's : Needs assistance/impaired Eating/Feeding: Set up, Sitting Eating/Feeding Details (indicate cue type and reason): Pt reports using R hand to feed self with assistance from L hand. Grooming: Minimal assistance, Sitting Upper Body Bathing: Minimal assistance, Sitting Lower Body Bathing: Min guard, Sit to/from stand Upper Body Dressing : Minimal assistance, Sitting Lower Body Dressing: Min guard, Sit to/from stand Toilet Transfer Details (indicate cue type and reason): unable to test due to IV team arriving General ADL Comments: Session focused on R UE strengthening and fine motor coordination for continued education. Educated pt and family concerning position of hand with digits extended in order to facilitate functional positioning for ADL. Pt reports practicing writing and coloring today. Educated pt on importance of incorporating R UE into ADL participation at least as a functional assist.    Mobility  Overal bed mobility: Needs Assistance Bed Mobility: Supine to Sit, Sit to Supine Supine to sit: Supervision Sit to supine: Min assist General bed mobility comments: Increased time and effort with use of bed rails.  Assist to bring LE's onto bed.    Transfers  Overall transfer level: Needs assistance Equipment used:  (Holding bedside table for stability) Transfers: Sit to/from Stand Sit to Stand: Min assist General transfer comment: Verbal cues to push up from bed rather than reach for furniture.  Assist to steady during transfer to standing.    Ambulation / Gait / Stairs / Wheelchair Mobility  Ambulation/Gait Ambulation/Gait assistance: Mod assist Ambulation  Distance (Feet): 20 Feet Assistive device: 1 person hand held assist  Gait Pattern/deviations: Step-to pattern, Decreased stance time - right, Decreased step length - left, Decreased stride length, Decreased dorsiflexion - right, Decreased weight shift to right, Shuffle, Trunk flexed General Gait Details: Patient with decreased Rt knee control in stance, with knee in fixed extended position.  Knee buckles in stance if not locked into extension.  Gait unsteady requiring mod assist for balance/safety. Gait velocity: decreased Gait velocity interpretation: Below normal speed for age/gender    Posture / Balance Balance Overall balance assessment: Needs assistance Sitting-balance support: Feet supported, No upper extremity supported Sitting balance-Leahy Scale: Good Standing balance support: Single extremity supported, During functional activity Standing balance-Leahy Scale: Poor    Special needs/care consideration BiPAP/CPAP   no CPM  no Continuous Drip IV    no Dialysis   no        Life Vest   no Oxygen   Special Bed   no Trach Size   n/a Wound Vac (area)   no      Skin    WDL                              Bowel mgmt: last BM 10/04/16, continent, assist to bathroom Bladder mgmt:   continent Diabetic mgmt   Metformin home regime, insulin added this hospitalization     Previous Home Environment Living Arrangements: Spouse/significant other, Children  Lives With: Spouse Available Help at Discharge: Family, Available 24 hours/day Type of Home: House Home Layout: One level Home Access: Stairs to enter Entrance Stairs-Rails: Can reach both, Right, Left Entrance Stairs-Number of Steps: 5 Bathroom Shower/Tub: Walk-in shower Home Care Services: No  Discharge Living Setting Plans for Discharge Living Setting: Patient's home Type of Home at Discharge: House Discharge Home Layout: One level Discharge Home Access: Stairs to enter Entrance Stairs-Rails: Right, Left, Can reach  both Entrance Stairs-Number of Steps: 5 Discharge Bathroom Shower/Tub: Walk-in shower Discharge Bathroom Toilet: Standard Discharge Bathroom Accessibility: Yes How Accessible: Accessible via walker Does the patient have any problems obtaining your medications?: No  Social/Family/Support Systems Patient Roles: Spouse, Parent Anticipated Caregiver: Onalee Steinbach, husband with assist from 2 adult children who live in the home Anticipated Caregiver's Contact Information: Scott Fix, husband, [phone] Ability/Limitations of Caregiver: husband works full time but has spoken with his employer and plans to take FMLA for as long as needed Caregiver Availability: 24/7 Discharge Plan Discussed with Primary Caregiver: Yes Is Caregiver In Agreement with Plan?: Yes Does Caregiver/Family have Issues with Lodging/Transportation while Pt is in Rehab?: No   Goals/Additional Needs Patient/Family Goal for Rehab: modified independent and supervision PT/OT; ideependent and modified independent SLP Expected length of stay: 10-15 days Cultural Considerations: n/a Dietary Needs: heart healthy, carb modified, thin liquids Equipment Needs: TBA Pt/Family Agrees to Admission and willing to participate: Yes Program Orientation Provided & Reviewed with Pt/Caregiver Including Roles  & Responsibilities: Yes   Decrease burden of Care through IP rehab admission: n/a   Possible need for SNF placement upon discharge:   Not expected   Patient Condition: This patient's condition remains as documented in the consult dated 10/04/16 , in which the Rehabilitation Physician determined and documented that the patient's condition is appropriate for intensive rehabilitative care in an inpatient rehabilitation facility. Will admit to inpatient rehab today.  Preadmission Screen Completed By:  Shelley Frazier, 10/04/2016 12:20 PM ______________________________________________________________________   Discussed  status with Dr.  Posey Pronto on 10/04/16 at  1426  and received telephone approval  for admission today.  Admission Coordinator:  Shelley Frazier, time 0919 Sudie Grumbling 10/04/16       Cosigned by: Jamse Arn, MD at 10/04/2016 2:44 PM  Revision History

## 2016-10-05 NOTE — Progress Notes (Addendum)
Physical Therapy Assessment and Plan  Patient Details  Name: Shelley Frazier MRN: [id number] Date of Birth: [birth year]  PT Diagnosis: Abnormal posture, Abnormality of gait, Coordination disorder, Difficulty walking, Hemiparesis dominant and Muscle weakness Rehab Potential: Good ELOS: 9-12   Today's Date: 10/05/2016 PT Individual Time: 3825-0539, [id number] PT Individual Time Calculation (min): 59 min, 29 min  Problem List:  Patient Active Problem List   Diagnosis Date Noted  . Benign essential HTN   . Transaminitis   . Acute ischemic stroke ([place])   . Hyperglycemia   . Chronic diastolic congestive heart failure ([place])   . Right hemiparesis ([place])   . Dysarthria, post-stroke   . Poorly controlled diabetes mellitus ([place])   . Dyslipidemia   . Mixed hyperlipidemia   . Basilar artery stenosis   . 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 DM (diabetes mellitus), type 2 with neurological complications ([place]) 79/07/4095  . Hot flashes 07/30/2013  . Hypertension 07/30/2013  . Perimenopausal symptoms 07/30/2013    Past Medical History:  Past Medical History:  Diagnosis Date  . Back pain   . Diabetes mellitus without complication ([place])   . Fatty liver   . Gallstones   . Hot flashes 07/30/2013  . Hypertension   . Obesity   . Perimenopausal symptoms 07/30/2013   Past Surgical History:  Past Surgical History:  Procedure Laterality Date  . TONSILLECTOMY AND ADENOIDECTOMY    . TUBAL LIGATION      Assessment & Plan Clinical Impression: Patient is a 51 y.o. year old female with history of HTN, T2DM, fatty liver who was admitted to [facility] on 10/01/16 with history of right sided weakness and slurred speech that started the night before. MRI brain done revealing "Acute nonhemorrhagic LEFT pontine brainstem infarct. 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 CTA brain/neck showed severe mid basilar  stenosis and no dissection. Dr. Erlinda Frazier recommended DAPT for 3 months followed by plavix alone.  Patient transferred to CIR on 10/04/2016 .   Patient currently requires min with mobility secondary to muscle weakness, impaired timing and sequencing, decreased coordination and decreased motor planning and decreased sitting balance, decreased standing balance and hemiplegia.  Prior to hospitalization, patient was independent  with mobility and lived with Spouse in a House home.  Home access is 5Stairs to enter.  Patient will benefit from skilled PT intervention to maximize safe functional mobility and minimize fall risk for planned discharge home with 24 hour supervision.  Anticipate patient will benefit from follow up [place] at discharge.  PT - End of Session Activity Tolerance: Tolerates 30+ min activity with multiple rests PT Assessment Rehab Potential (ACUTE/IP ONLY): Good PT Patient demonstrates impairments in the following area(s): Balance;Endurance;Motor PT Transfers Functional Problem(s): Bed Mobility;Bed to Chair;Car;Furniture;Floor PT Locomotion Functional Problem(s): Ambulation;Wheelchair Mobility;Stairs PT Plan PT Intensity: Minimum of 1-2 x/day ,45 to 90 minutes PT Frequency: 5 out of 7 days PT Duration Estimated Length of Stay: 9-12 PT Treatment/Interventions: Ambulation/gait training;Balance/vestibular training;Community reintegration;Discharge planning;Disease management/prevention;DME/adaptive equipment instruction;Functional electrical stimulation;Functional mobility training;Neuromuscular re-education;Patient/family education;Psychosocial support;Splinting/orthotics;Stair training;Therapeutic Activities;Therapeutic Exercise;UE/LE Strength taining/ROM;UE/LE Coordination activities;Wheelchair propulsion/positioning PT Transfers Anticipated Outcome(s): supervision-mod I  PT Locomotion Anticipated Outcome(s): ambulatory with LRAD PT Recommendation Recommendations for Other Services: Therapeutic  Recreation consult Therapeutic Recreation Interventions: Kitchen group;Outing/community reintergration Follow Up Recommendations: Home health PT;24 hour supervision/assistance Patient destination: Home Equipment Recommended: To be determined  Skilled Therapeutic Intervention Session 1: Pt seen sitting in recliner upon arrival to room.  Pt eager to begin PT session. During PT session, ambulation was attempted with/without assistive device. Pt ambulating 20 ft with rw and 15 ft with HHA. With transfers, pt needing repeated cues to reach back prior to sitting. W/C provided and education provided on technique for propulsion. Spouse present but multiple family members stopping by to check on her throughout session. Reviewed PT goals and overall treatment during rehab stay. Pt in room after session, sitting in w/c with CSW. All needs in reach. Session 2: Pt in w/c upon arrival, agreeable to PT session. Spouse present and able to demonstrate safe guarding technique with pt for ambulation using rw in bathroom and with transfers from w/c and toilet. Ambulation: pt ambulating 75 ft with rw and min guard assist/close supervision. Working in even strides and Rt dorsiflexion with swing phase. Up/down 4 steps with rail, min guard assist and cues. NRE: static standing balance with perturbations, foot taps to floor targets bilaterally (with/without rw support). Pt needing physical support for dynamic balance activities. Following session, pt returned to room. Sitting in w/c with spouse present and all needs in reach.     PT Evaluation Precautions/Restrictions Precautions Precautions: Fall Precaution Comments: R hemi Restrictions Weight Bearing Restrictions: No Pain  Denies pain.  Home Living/Prior Functioning Home Living Available Help at Discharge: Family;Available 24 hours/day Type of Home: House Home Access: Stairs to enter [employer] of Steps: 5 Entrance Stairs-Rails: Can reach  both;Right;Left Home Layout: One level Additional Comments: husband will be taking time off work to care for her.   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  Vision/Perception  Vision - History Baseline Vision: Wears glasses all the time Patient Visual Report: No change from baseline Vision - Assessment Eye Alignment: Within Functional Limits Perception Perception: Within Functional Limits  Cognition Overall Cognitive Status: Within Functional Limits for tasks assessed Arousal/Alertness: Awake/alert Orientation Level: Oriented X4 Attention: Sustained Sustained Attention: Appears intact Memory: Appears intact Awareness: Appears intact Problem Solving: Appears intact Safety/Judgment: Appears intact Sensation Sensation Light Touch: Appears Intact Proprioception: Appears Intact Coordination Gross Motor Movements are Fluid and Coordinated: No Fine Motor Movements are Fluid and Coordinated: No Coordination and Movement Description: delayed speed and coordination through Rt LE Finger Nose Finger Test: accurate with LUE Heel Shin Test: Rt LE decreased accuracy, speed and range Motor  Motor Motor: Hemiplegia Motor - Skilled Clinical Observations: decreased strength/coordination through Rt UE/LE.     Balance Balance Balance Assessed: Yes Static Sitting Balance Static Sitting - Balance Support: No upper extremity supported Static Sitting - Level of Assistance: 7: Independent Dynamic Sitting Balance Dynamic Sitting - Balance Support: No upper extremity supported Dynamic Sitting - Level of Assistance: 5: Stand by assistance Static Standing Balance Static Standing - Balance Support: No upper extremity  supported Static Standing - Level of Assistance: 5: Stand by assistance Static Stance: Eyes Closed: increased sway, no gross loss of balance Dynamic Standing Balance Dynamic Standing - Balance Support: During functional activity;Bilateral upper extremity supported Dynamic Standing - Level of Assistance: 4: Min assist Extremity Assessment  RUE Strength RUE Overall Strength Comments: able to demonstrate active motion  LUE Strength LUE Overall Strength Comments: WFL RLE Strength RLE Overall Strength Comments: hip flexion 3/5, knee extension 3+/5, knee flexion 3/5, dorsiflexion 3+/5, plantarflexion 3+/5 LLE Strength LLE Overall Strength Comments:  grossly 5/5 throughout   See Function Navigator for Current Functional Status.   Refer to Care Plan for Long Term Goals  Recommendations for other services: Therapeutic Recreation  Kitchen group and Outing/community reintegration  Discharge Criteria: Patient will be discharged from PT if patient refuses treatment 3 consecutive times without medical reason, if treatment goals not met, if there is a change in medical status, if patient makes no progress towards goals or if patient is discharged from hospital.  The above assessment, treatment plan, treatment alternatives and goals were discussed and mutually agreed upon: by patient  Linard Millers, PT 10/05/2016, 12:42 PM

## 2016-10-05 NOTE — Plan of Care (Signed)
Problem: RH BOWEL ELIMINATION Goal: RH OTHER STG BOWEL ELIMINATION GOALS W/ASSIST Other STG Bowel Elimination Goals With Assistance. Outcome: Progressing Last BM on 10/02/2016  Problem: RH SKIN INTEGRITY Goal: RH STG SKIN FREE OF INFECTION/BREAKDOWN Outcome: Progressing No skin issues noted  Problem: RH PAIN MANAGEMENT Goal: RH STG PAIN MANAGED AT OR BELOW PT'S PAIN GOAL Outcome: Progressing Medicated once this shift for right leg pain with full relief

## 2016-10-05 NOTE — Progress Notes (Signed)
Social Work Assessment and Plan Social Work Assessment and Plan  Patient Details  Name: Shelley Frazier MRN: [id number] Date of Birth: Nov 08, 1965  Today's Date: 10/05/2016  Problem List:  Patient Active Problem List   Diagnosis Date Noted  . Benign essential HTN   . Transaminitis   . Acute ischemic stroke ([place])   . Hyperglycemia   . Chronic diastolic congestive heart failure ([place])   . Right hemiparesis ([place])   . Dysarthria, post-stroke   . Poorly controlled diabetes mellitus ([place])   . Dyslipidemia   . Mixed hyperlipidemia   . Basilar artery stenosis   . 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 DM (diabetes mellitus), type 2 with neurological complications ([place]) 40/97/3532  . Hot flashes 07/30/2013  . Hypertension 07/30/2013  . Perimenopausal symptoms 07/30/2013   Past Medical History:  Past Medical History:  Diagnosis Date  . Back pain   . Diabetes mellitus without complication ([place])   . Fatty liver   . Gallstones   . Hot flashes 07/30/2013  . Hypertension   . Obesity   . Perimenopausal symptoms 07/30/2013   Past Surgical History:  Past Surgical History:  Procedure Laterality Date  . TONSILLECTOMY AND ADENOIDECTOMY    . TUBAL LIGATION     Social History:  reports that she has never smoked. She has never used smokeless tobacco. She reports that she does not drink alcohol or use drugs.  Family / Support Systems Marital Status: Married Patient Roles: Spouse, Parent, Other (Comment) (employee) Spouse/Significant Other: Truman Hayward [phone]-home [phone] Children: two grown children in the home Other Supports: Friends and Colleagues Anticipated Caregiver: Truman Hayward Ability/Limitations of Caregiver: Husband plans to take FMLA to assist with pt's care Caregiver Availability: 24/7 Family Dynamics: Close knit family two grown children are involved and will assist Mom at home if needed. Pt has always been very independent and the caregiver's of others. She is not used to this role  and doesn't like it. She wants to get independent boefre she leaves here.  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 Husband plans to be here daily   Abuse/Neglect Physical Abuse: Denies Verbal Abuse: Denies Sexual Abuse: Denies Exploitation of patient/patient's resources: Denies Self-Neglect: Denies  Emotional Status Pt's affect, behavior adn adjustment status: Pt is motivated to do well here and regain her independence. She can see some progress already and is encouraged by this and hopeful it will continue. She has never been one to rely upon others and alwasy done for herself. Recent Psychosocial Issues: health issues but felt they were managed Pyschiatric History: No history deferred depression screen due to doing well and able to verbalize her feelings and concerns. Will have neuro-psych see while here for coping. Team agrees with this plan. Substance Abuse History: No issues  Patient / Family Perceptions, Expectations & Goals Pt/Family understanding of illness & functional limitations: Pt and husband can explain her stroke and deficits. She is making progress which is encouraging to her and she feels she will be independent by the time she leaves here. She is glad to be here and ready to do rehab to recover. Premorbid pt/family roles/activities: Wife, Mother, employee, church member, friend, etc Anticipated changes in roles/activities/participation:  resume Pt/family expectations/goals: Pt states: " I want to take care of myself and get back to work. "  Husband states: " I hope she does well here but will  assist her if needed."  [employer]: None Premorbid Home Care/DME Agencies: None Transportation available at discharge: Family members Resource referrals recommended: Neuropsychology, Support group (specify)  Discharge Planning Living Arrangements: Spouse/significant other, Children Support Systems: Spouse/significant other, Children, Friends/neighbors, Immunologist, Other relatives Type of Residence: Private residence Insurance Resources: Multimedia programmer (specify) Printmaker) Museum/gallery curator Resources: Employment, Secondary school teacher Screen Referred: No Living Expenses: Higher education careers adviser Management: Spouse, Patient Does the patient have any problems obtaining your medications?: No Home Management: Pt does most of the home management Patient/Family Preliminary Plans: Return home with husband who is taking a FMLA from work to assist her. Their two grown children will also be assisting if needed, but work. Discussed team conference and goals and will await team's evaluations. Social Work Anticipated Follow Up Needs: HH/OP, Support Group  Clinical Impression Pleasant motivated female who is young and ready to work hard in therapies to regain her independence. She is encouraged by the progress she has made already. Her family is very supportive and willing to assist her if needed. Aware team conference tomorrow and will get FMLA paperwork completed for husband and pt. Work on discharge plans. Will make neruo-psych referral to be seen while here.  Elease Hashimoto 10/05/2016, 12:47 PM

## 2016-10-05 NOTE — Progress Notes (Signed)
Shelley Frazier PHYSICAL MEDICINE & REHABILITATION     PROGRESS NOTE  Subjective/Complaints:  Pt seen sitting up in bed this AM.  She slept well overnight.  She is ready to begin therapies.  She wants to use the restroom and states she can do it, but encouraged to ask for assistance for time being.   ROS: Denies CP, SOB, N/V/D.  Objective: Vital Signs: Blood pressure (!) 150/72, pulse 89, temperature 98.2 F (36.8 C), temperature source Oral, height 5\' 3"  (1.6 m), weight 77.6 kg (171 lb 1 oz), last menstrual period 04/18/2013, SpO2 99 %. No results found.  Recent Labs  10/05/16 0523  WBC 8.4  HGB 14.4  HCT 42.0  PLT 283    Recent Labs  10/05/16 0523  NA 135  K 4.0  CL 101  GLUCOSE 267*  BUN 14  CREATININE 0.75  CALCIUM 9.3   CBG (last 3)   Recent Labs  10/04/16 1244 10/04/16 2054 10/05/16 0626  GLUCAP 298* 298* 258*    Wt Readings from Last 3 Encounters:  10/04/16 77.6 kg (171 lb 1 oz)  10/01/16 78.4 kg (172 lb 12.8 oz)  08/30/16 81.2 kg (179 lb)    Physical Exam:  BP (!) 150/72 (BP Location: Left Arm) Comment: RN notified  Pulse 89   Temp 98.2 F (36.8 C) (Oral)   Ht 5\' 3"  (1.6 m)   Wt 77.6 kg (171 lb 1 oz)   LMP 04/18/2013   SpO2 99%   BMI 30.30 kg/m  Constitutional: She appears well-developed and well-nourished. NAD. HENT: Normocephalic and atraumatic.  Eyes: EOMI. No discharge.  Cardiovascular: Normal rate and regular rhythm.  No JVD. Respiratory: Effort normal and breath sounds normal.  GI: Soft. Bowel sounds are normal.  Musculoskeletal: She exhibits no edema or tenderness.  Neurological: She is alert and oriented.  Right facial weakness with mild dysarthria.  Decreased sensation right hand and RLE.  Able to follow basic commands without difficulty.  Motor: LUE/LLE 5/5 proximal to distal  RUE/RLE: 3+/5 proximal to distal   Skin: Skin is warm and dry.  Psychiatric: She has a normal mood and affect. Her behavior is normal. Judgment and thought  content normal   Assessment/Plan: 1. Functional deficits secondary to acute pontine infact which require 3+ hours per day of interdisciplinary therapy in a comprehensive inpatient rehab setting. Physiatrist is providing close team supervision and 24 hour management of active medical problems listed below. Physiatrist and rehab team continue to assess barriers to discharge/monitor patient progress toward functional and medical goals.  Function:  Bathing Bathing position      Bathing parts      Bathing assist        Upper Body Dressing/Undressing Upper body dressing                    Upper body assist        Lower Body Dressing/Undressing Lower body dressing                                  Lower body assist        Toileting Toileting          Toileting assist     Transfers Chair/bed Clinical biochemist  Cognition Comprehension    Expression    Social Interaction    Problem Solving    Memory      Medical Problem List and Plan: 1.  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   Begin CIR 2.  DVT Prophylaxis/Anticoagulation: Pharmaceutical: Lovenox 3. Pain Management: N/A 4. Mood: LCSW to follow for evaluation and support.  5. Neuropsych: This patient is capable of making decisions on her own behalf. 6. Skin/Wound Care: routine pressure relief mesures 7. Fluids/Electrolytes/Nutrition: Monitor I/Os 8. HTN: Monitor BP bid--permissive HTN with long term goal 130-150 due to BA stenosis.   Monitor with increased activity 9. T2DM: Poorly controlled with Hgb A1c-   Started on lantus with meal coverage. Resumed metformin. Monitor BS ac/hs and titrate insulin as needed.   Monitor with increased activity 10. Dyslipidemia: On Lipitor.  11. Transaminitis  Cont to monitor LFTs   LOS (Days) 1 A FACE TO FACE EVALUATION WAS PERFORMED  Shelley Frazier Shelley Frazier 10/05/2016 8:47 AM

## 2016-10-06 ENCOUNTER — Inpatient Hospital Stay (HOSPITAL_COMMUNITY): Payer: BC Managed Care – PPO | Admitting: Physical Therapy

## 2016-10-06 ENCOUNTER — Inpatient Hospital Stay (HOSPITAL_COMMUNITY): Payer: BC Managed Care – PPO | Admitting: Speech Pathology

## 2016-10-06 ENCOUNTER — Inpatient Hospital Stay (HOSPITAL_COMMUNITY): Payer: BC Managed Care – PPO | Admitting: Occupational Therapy

## 2016-10-06 LAB — GLUCOSE, CAPILLARY
GLUCOSE-CAPILLARY: 255 mg/dL — AB (ref 65–99)
GLUCOSE-CAPILLARY: 220 mg/dL — AB (ref 65–99)
GLUCOSE-CAPILLARY: 172 mg/dL — AB (ref 65–99)
Glucose-Capillary: 145 mg/dL — ABNORMAL HIGH (ref 65–99)

## 2016-10-06 MED ORDER — INSULIN GLARGINE 100 UNIT/ML ~~LOC~~ SOLN
40.0000 [IU] | Freq: Every day | SUBCUTANEOUS | Status: DC
Start: 2016-10-06 — End: 2016-10-08
  Administered 2016-10-06 – 2016-10-07 (×2): 40 [IU] via SUBCUTANEOUS
  Filled 2016-10-06 (×2): qty 0.4

## 2016-10-06 NOTE — Progress Notes (Signed)
Inpatient Diabetes Program Recommendations  AACE/ADA: New Consensus Statement on Inpatient Glycemic Control (2015)  Target Ranges:  Prepandial:   less than 140 mg/dL      Peak postprandial:   less than 180 mg/dL (1-2 hours)      Critically ill patients:  140 - 180 mg/dL   Lab Results  Component Value Date   GLUCAP 220 (H) 10/06/2016   HGBA1C 13.6 (H) 10/01/2016    Review of Glycemic Control:  Results for LASHAUNDRA, LEHRMANN (MRN [id number]) as of 10/06/2016 15:16  Ref. Range 10/05/2016 06:26 10/05/2016 12:34 10/05/2016 16:30 10/05/2016 20:34 10/06/2016 06:41 10/06/2016 12:04  Glucose-Capillary Latest Ref Range: 65 - 99 mg/dL 258 (H) 275 (H) 259 (H) 198 (H) 255 (H) 220 (H)    Diabetes history: Type 2 diabetes Outpatient Diabetes medications: Amaryl 2 mg daily, Metformin 1000 mg bid Current orders for Inpatient glycemic control:  Lantus 40 units q HS, Novolog sensitive tid with meals and HS, Novolog 5 units tid with meals  Inpatient Diabetes Program Recommendations:    Met with patient and husband regarding home insulin use.  Demonstrated use of insulin pen with patient and allowed her to practice.  She was unable to to manipulate pen with her right hand but was able to hold with her left hand and practice injecting.  May need assistant initially with insulin administration at home.  Gave her co-pay card, information on plate method, and hypoglycemia handout.  Patient and husband very engaged in teaching.  Will ask RN to also show patient diabetes videos.  She states that she self-administered insulin to herself today.  She has insulin starter kit and Living well with diabetes booklet as well.   Thanks, Adah Perl, RN, BC-ADM Inpatient Diabetes Coordinator Pager [phone] (8a-5p)

## 2016-10-06 NOTE — Patient Care Conference (Signed)
Inpatient RehabilitationTeam Conference and Plan of Care Update Date: 10/06/2016   Time: 2:15 PM    Patient Name: Shelley Frazier      Medical Record Number: [id number]  Date of Birth: Jul 11, 1965 Sex: Female         Room/Bed: 4M08C/4M08C-01 Payor Info: Payor: BLUE [place] / Plan: [place] PPO / Product Type: *No Product type* /    Admitting Diagnosis: L Pontine CVA  Admit Date/Time:  10/04/2016  5:12 PM Admission Comments: No comment available   Primary Diagnosis:  <principal problem not specified> Principal Problem: <principal problem not specified>  Patient Active Problem List   Diagnosis Date Noted  . Benign essential HTN   . Transaminitis   . Acute ischemic stroke ([place])   . Hyperglycemia   . Chronic diastolic congestive heart failure ([place])   . Right hemiparesis ([place])   . Dysarthria, post-stroke   . Poorly controlled diabetes mellitus ([place])   . Dyslipidemia   . Mixed hyperlipidemia   . Basilar artery stenosis   . 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 DM (diabetes mellitus), type 2 with neurological complications ([place]) 49/67/5916  . Hot flashes 07/30/2013  . Hypertension 07/30/2013  . Perimenopausal symptoms 07/30/2013    Expected Discharge Date: Expected Discharge Date: 10/12/16  Team Members Present: Physician leading conference: Dr. Delice Lesch Social Worker Present: Ovidio Kin, LCSW Nurse Present: Rayetta Pigg, RN PT Present: Other (comment) Gwenith Spitz) OT Present: Clyda Greener, OT SLP Present: Windell Moulding, SLP PPS Coordinator present : Daiva Nakayama, RN, CRRN     Current Status/Progress Goal Weekly Team Focus  Medical   Right hemiparesis, speech deficits secondary to acute pontine infact on 5/4  Improve mobility, safety, medication compliance  See above   Bowel/Bladder        Cont B & B     Swallow/Nutrition/ Hydration             ADL's   supervision to min assist for bathing and dressing.  Min assist for transfers and  mobility to the bathroom without use of assistive device  modified independent level  selfcare retraining, balance retraining, neuromuscular re-education, pt/family education therapeutic exercise   Mobility   supervision - min guard for transfers, ambulation for safety  Mod I ambulatory with LRAD  Work toward mod I goal achievement, balance, gait, coordination. Family education as able.   Communication   mild dysarthria, supervision  mod I   education and carryover of compensatory strategies    Safety/Cognition/ Behavioral Observations            Pain        less than 3-monitor     Skin        no issues        *See Care Plan and progress notes for long and short-term goals.  Barriers to Discharge: Mobility, safety, uncontrolled DM and HTN, transaminitis    Possible Resolutions to Barriers:  Therapies, optimize DM and HTN meds, follow labs    Discharge Planning/Teaching Needs:  Home with husband who is taking a FMLA to assist her at home. FMLA forms given to PA to complete      Team Discussion:  Goals mod/I level husband has ben checked off to transfer to bathroom. Good safety awareness, working on balance issues. MD adjusting diabetes and BP unstable prior to admission will be going home on insulin, will need education she and husband on this. MD watching liver function stabilizing. OP therapies recommended.  Revisions to Treatment Plan:  DC 5/15   Continued Need for Acute Rehabilitation Level of Care: The patient requires daily medical management by a physician with specialized training in physical medicine and rehabilitation for the following conditions: Daily direction of a multidisciplinary physical rehabilitation program to ensure safe treatment while eliciting the highest outcome that is of practical value to the patient.: Yes Daily medical management of patient stability for increased activity during participation in an intensive rehabilitation regime.: Yes Daily analysis  of laboratory values and/or radiology reports with any subsequent need for medication adjustment of medical intervention for : Neurological problems;Diabetes problems;Blood pressure problems  Lakeria Starkman, Gardiner Rhyme 10/07/2016, 9:17 AM

## 2016-10-06 NOTE — Progress Notes (Signed)
Occupational Therapy Session Note  Patient Details  Name: Shelley Frazier MRN: [id number] Date of Birth: February 28, 1966  Today's Date: 10/06/2016 OT Individual Time: 0905-1001 OT Individual Time Calculation (min): 56 min    Short Term Goals: Week 1:  OT Short Term Goal 1 (Week 1): STGs equal to LTGs set at overall modified independent level.  Skilled Therapeutic Interventions/Progress Updates:    Pt ambulated to the therapy gym with close supervision and use of the RW.  Therapist provided facilitation and the right trunk with weightbearing secondary to pt falling into lateral flexion at times, but it was not needed for balance.  Pt with occasional toe drag on the right as well.  Once in the therapy gym worked in quadriped to work on weightbearing through [employer].  Min facilitation needed to maintain elbow extension.  Also worked on transitions from prone to quadriped as well with min assist.   Next positioned pt in supine while holding large yellow ball with both UEs  Focused on bilateral shoulder flexion for 3 set of 10 repetitions.  She needed consistent min assist to maintain elbow extension in the RUE throughout movement as well as maintaining symetrical alignment with the left arm.  Finished gym work by having pt work on [place] up [employer] with the [place] and placing in container.  Pt exhibits some right shoulder hike with initial movement.  She was encouraged to pay attention to this and to no allow it if possible.  Issued small foam pieces to work on picking up in her room as well.  Ambulated back to the room with the RW to conclude session.  Pt's spouse present in room with pt left sitting in bedside recliner.   Therapy Documentation Precautions:  Precautions Precautions: Fall Precaution Comments: R hemi Restrictions Weight Bearing Restrictions: No  Pain: Pain Assessment Pain Assessment: No/denies pain ADL: See Function Navigator for Current Functional  Status.   Therapy/Group: Individual Therapy  Derian Pfost OTR/L 10/06/2016, 10:30 AM

## 2016-10-06 NOTE — Progress Notes (Signed)
[place] PHYSICAL MEDICINE & REHABILITATION     PROGRESS NOTE  Subjective/Complaints:  Pt seen sitting up at the edge of the bed.  She slept well overnight.  She states she had a good day in therapies as well.   ROS: Denies CP, SOB, N/V/D.  Objective: Vital Signs: Blood pressure 137/78, pulse 82, temperature 98.9 F (37.2 C), temperature source Oral, resp. rate 18, height 5\' 3"  (1.6 m), weight 77.6 kg (171 lb 1 oz), last menstrual period 04/18/2013, SpO2 99 %. No results found.  Recent Labs  10/05/16 0523  WBC 8.4  HGB 14.4  HCT 42.0  PLT 283    Recent Labs  10/05/16 0523  NA 135  K 4.0  CL 101  GLUCOSE 267*  BUN 14  CREATININE 0.75  CALCIUM 9.3   CBG (last 3)   Recent Labs  10/05/16 1630 10/05/16 2034 10/06/16 0641  GLUCAP 259* 198* 255*    Wt Readings from Last 3 Encounters:  10/04/16 77.6 kg (171 lb 1 oz)  10/01/16 78.4 kg (172 lb 12.8 oz)  08/30/16 81.2 kg (179 lb)    Physical Exam:  BP 137/78 (BP Location: Left Arm)   Pulse 82   Temp 98.9 F (37.2 C) (Oral)   Resp 18   Ht 5\' 3"  (1.6 m)   Wt 77.6 kg (171 lb 1 oz)   LMP 04/18/2013   SpO2 99%   BMI 30.30 kg/m  Constitutional: She appears well-developed and well-nourished. NAD. HENT: Normocephalic and atraumatic.  Eyes: EOMI. No discharge.  Cardiovascular: RRR.  No JVD. Respiratory: Effort normal and breath sounds normal.  GI: Soft. Bowel sounds are normal.  Musculoskeletal: She exhibits no edema or tenderness.  Neurological: She is alert and oriented.  Right facial weakness with mild dysarthria.  Decreased sensation right hand and RLE.  Able to follow basic commands without difficulty.  Motor: LUE/LLE 5/5 proximal to distal  RUE: 3+/5 proximal to distal   RLE: 4/5 proximal to distal   Skin: Skin is warm and dry.  Psychiatric: She has a normal mood and affect. Her behavior is normal. Judgment and thought content normal   Assessment/Plan: 1. Functional deficits secondary to acute  pontine infact which require 3+ hours per day of interdisciplinary therapy in a comprehensive inpatient rehab setting. Physiatrist is providing close team supervision and 24 hour management of active medical problems listed below. Physiatrist and rehab team continue to assess barriers to discharge/monitor patient progress toward functional and medical goals.  Function:  Bathing Bathing position   Position: Shower  Bathing parts Body parts bathed by patient: Right arm, Chest, Abdomen, Front perineal area, Buttocks, Right upper leg, Left upper leg, Right lower leg, Left lower leg, Back Body parts bathed by helper: Left arm  Bathing assist        Upper Body Dressing/Undressing Upper body dressing   What is the patient wearing?: Pull over shirt/dress     Pull over shirt/dress - Perfomed by patient: Thread/unthread right sleeve, Thread/unthread left sleeve, Put head through opening, Pull shirt over trunk          Upper body assist        Lower Body Dressing/Undressing Lower body dressing   What is the patient wearing?: Pants, Underwear, Socks, Shoes Underwear - Performed by patient: Thread/unthread right underwear leg, Thread/unthread left underwear leg, Pull underwear up/down   Pants- Performed by patient: Thread/unthread right pants leg, Thread/unthread left pants leg, Pull pants up/down       Socks -  Performed by patient: Don/doff right sock, Don/doff left sock   Shoes - Performed by patient: Don/doff right shoe, Don/doff left shoe (slip on shoes)            Lower body assist Assist for lower body dressing: Touching or steadying assistance (Pt > 75%)      Toileting Toileting   Toileting steps completed by patient: Adjust clothing prior to toileting, Performs perineal hygiene, Adjust clothing after toileting      Toileting assist Assist level: Touching or steadying assistance (Pt.75%)   Transfers Chair/bed transfer   Chair/bed transfer method: Lateral  scoot Chair/bed transfer assist level: Touching or steadying assistance (Pt > 75%) Chair/bed transfer assistive device: Medical sales representative     Max distance: 20 ft Assist level: Touching or steadying assistance (Pt > 75%)   Wheelchair   Type: Manual Max wheelchair distance: 30 ft Assist Level: Touching or steadying assistance (Pt > 75%)  Cognition Comprehension Comprehension assist level: Follows complex conversation/direction with no assist  Expression Expression assist level: Expresses complex 90% of the time/cues < 10% of the time  Social Interaction Social Interaction assist level: Interacts appropriately with others - No medications needed.  Problem Solving Problem solving assist level: Solves complex problems: Recognizes & self-corrects  Memory Memory assist level: Complete Independence: No helper    Medical Problem List and Plan: 1.  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   Cont CIR 2.  DVT Prophylaxis/Anticoagulation: Pharmaceutical: Lovenox 3. Pain Management: N/A 4. Mood: LCSW to follow for evaluation and support.  5. Neuropsych: This patient is capable of making decisions on her own behalf. 6. Skin/Wound Care: routine pressure relief mesures 7. Fluids/Electrolytes/Nutrition: Monitor I/Os 8. HTN: Monitor BP bid--permissive HTN with long term goal 130-150 due to BA stenosis.   Improving 5/9 9. T2DM: Poorly controlled with Hgb A1c  Lantus increased to 40U on 5/9 with meal coverage.   Metformin 500 BID, pt cannot tolerate higher dose  Monitor BS ac/hs and titrate insulin as needed.   Monitor with increased activity 10. Dyslipidemia: On Lipitor.  11. Transaminitis  Cont to monitor LFTs   LOS (Days) 2 A FACE TO FACE EVALUATION WAS PERFORMED  Ankit Lorie Phenix 10/06/2016 8:07 AM

## 2016-10-06 NOTE — Progress Notes (Signed)
Physical Therapy Session Note  Patient Details  Name: Shelley Frazier MRN: [id number] Date of Birth: [birth year]  Today's Date: 10/06/2016 PT Individual Time: 1300-1356 PT Individual Time Calculation (min): 56 min   Short Term Goals: Week 1:  PT Short Term Goal 1 (Week 1): STG=LTG  Skilled Therapeutic Interventions/Progress Updates:    Pt returning from bathroom with husband upon arrival. Pt agreeable to PT session. Transfers: sit<>stand supervision using rw, supervision/min guard without device. Repeating throughout session with focus on even weightbearing and controlled descent. Gait: ambulating 140 ft X2, 75 ft X1 without device. Pt initially supervision but requiring min guard with fatigue due to instability. Decreased Rt foot clearance with swing phase with fatigue and increasing trendelenburg. Min assist provided with ambulation not using device, decreased gross stability but no frank loss of balance. Balance: static and dynamic standing activities including to taps to step, cone, floor targets (variable angles and speeds bilaterally). Sit<>stand repeats with Lt foot forward for increased Rt LE focus. Pt returned to room after session, up in w/c with all needs in reach.   Therapy Documentation Precautions:  Precautions Precautions: Fall Precaution Comments: R hemi Restrictions Weight Bearing Restrictions: No   Pain: Pain Assessment Pain Assessment: No/denies pain  See Function Navigator for Current Functional Status.   Therapy/Group: Individual Therapy  Linard Millers, PT 10/06/2016, 3:51 PM

## 2016-10-06 NOTE — Progress Notes (Signed)
Speech Language Pathology Daily Session Note  Patient Details  Name: Shelley Frazier MRN: [id number] Date of Birth: [birth year]  Today's Date: 10/06/2016 SLP Individual Time: 9323-5573 SLP Individual Time Calculation (min): 45 min  Short Term Goals: Week 1: SLP Short Term Goal 1 (Week 1): short term goals to equal long term goals due to pt's anticipated length of stay.   Skilled Therapeutic Interventions:  Pt was seen for skilled ST targeting speech intelligibility goals.  SLP facilitated the session with skilled education regarding compensatory intelligibility strategies, emphasizing slow rate, increased vocal intensity, and overarticulation.  Pt utilized the abovementioned strategies in a novel verbal description task both with and without background noise with mod I to achieve intelligibility at the conversational level.  Would recommend use of intelligibility strategies in increasingly challenging environments/situations, such as multi-tasking or in a noisy environment.  Pt was left in recliner with husband at bedside.  Continue per current plan of care.       Function:  Eating Eating                 Cognition Comprehension Comprehension assist level: Follows complex conversation/direction with no assist  Expression   Expression assist level: Expresses basic needs/ideas: With no assist  Social Interaction Social Interaction assist level: Interacts appropriately with others - No medications needed.  Problem Solving Problem solving assist level: Solves complex problems: Recognizes & self-corrects  Memory Memory assist level: Complete Independence: No helper    Pain Pain Assessment Pain Assessment: No/denies pain Pain Score: 0-No pain  Therapy/Group: Individual Therapy  Tyeesha Riker, Selinda Orion 10/06/2016, 12:35 PM

## 2016-10-06 NOTE — Progress Notes (Signed)
Social Work Patient ID: Shelley Frazier, female   DOB: 1965-11-27, 51 y.o.   MRN: [id number]  Met with pt to discuss team conference goals mod/I level and target discharge date 5/15. She was pleased with this Plan and feels she is making good progress in therapies. husband has the FMLA papers [employer] completed and will turn them in. Pt to discuss with husband where she wants to go for OP therapies.  Work toward discharge next Tuesday.

## 2016-10-06 NOTE — Progress Notes (Signed)
Occupational Therapy Session Note  Patient Details  Name: Shelley Frazier MRN: [id number] Date of Birth: [birth year]  Today's Date: 10/06/2016 OT Individual Time: 0830-0900 OT Individual Time Calculation (min): 30 min    Short Term Goals: Week 1:  OT Short Term Goal 1 (Week 1): STGs equal to LTGs set at overall modified independent level.  Skilled Therapeutic Interventions/Progress Updates:      Pt seen for BADL retraining of  Bathing and dressing with a focus on functional mobility with RW, balance in standing, and active use of RUE.  Pt received in wc and used RW without hand splint as she now has improved grasp.  Pt ambulated to shower with touching A for balance.  Pt actively used RUE to wash body including L arm.  Pt ambulated back to EOB to dress with close S except for slight steadying A when pulling up her pants and needed A with last inch over R hip.  Pt resting at EOB with RN in room with her.   Therapy Documentation Precautions:  Precautions Precautions: Fall Precaution Comments: R hemi Restrictions Weight Bearing Restrictions: No   Pain: Pain Assessment Pain Assessment: No/denies pain ADL:   See Function Navigator for Current Functional Status.   Therapy/Group: Individual Therapy  Farmer 10/06/2016, 11:16 AM

## 2016-10-07 ENCOUNTER — Inpatient Hospital Stay (HOSPITAL_COMMUNITY): Payer: BC Managed Care – PPO | Admitting: Occupational Therapy

## 2016-10-07 ENCOUNTER — Inpatient Hospital Stay (HOSPITAL_COMMUNITY): Payer: BC Managed Care – PPO | Admitting: Physical Therapy

## 2016-10-07 ENCOUNTER — Inpatient Hospital Stay (HOSPITAL_COMMUNITY): Payer: BC Managed Care – PPO | Admitting: Speech Pathology

## 2016-10-07 LAB — GLUCOSE, CAPILLARY
GLUCOSE-CAPILLARY: 164 mg/dL — AB (ref 65–99)
Glucose-Capillary: 191 mg/dL — ABNORMAL HIGH (ref 65–99)
Glucose-Capillary: 254 mg/dL — ABNORMAL HIGH (ref 65–99)
Glucose-Capillary: 176 mg/dL — ABNORMAL HIGH (ref 65–99)
Glucose-Capillary: 154 mg/dL — ABNORMAL HIGH (ref 65–99)

## 2016-10-07 NOTE — Progress Notes (Signed)
PHYSICAL MEDICINE & REHABILITATION     PROGRESS NOTE  Subjective/Complaints:  Pt seen sitting up in bed this AM.  She slept well overnight.  Her main concern is her heavy RUE.    ROS: Denies CP, SOB, N/V/D.  Objective: Vital Signs: Blood pressure 130/60, pulse 75, temperature 98.5 F (36.9 C), temperature source Oral, resp. rate 20, height 5\' 3"  (1.6 m), weight 77.6 kg (171 lb 1 oz), last menstrual period 04/18/2013, SpO2 98 %. No results found.  Recent Labs  10/05/16 0523  WBC 8.4  HGB 14.4  HCT 42.0  PLT 283    Recent Labs  10/05/16 0523  NA 135  K 4.0  CL 101  GLUCOSE 267*  BUN 14  CREATININE 0.75  CALCIUM 9.3   CBG (last 3)   Recent Labs  10/06/16 2055 10/07/16 0109 10/07/16 0636  GLUCAP 172* 191* 254*    Wt Readings from Last 3 Encounters:  10/04/16 77.6 kg (171 lb 1 oz)  10/01/16 78.4 kg (172 lb 12.8 oz)  08/30/16 81.2 kg (179 lb)    Physical Exam:  BP 130/60 (BP Location: Left Arm)   Pulse 75   Temp 98.5 F (36.9 C) (Oral)   Resp 20   Ht 5\' 3"  (1.6 m)   Wt 77.6 kg (171 lb 1 oz)   LMP 04/18/2013   SpO2 98%   BMI 30.30 kg/m  Constitutional: She appears well-developed and well-nourished. NAD. HENT: Normocephalic and atraumatic.  Eyes: EOMI. No discharge.  Cardiovascular: RRR.  No JVD. Respiratory: Effort normal and breath sounds normal.  GI: Soft. Bowel sounds are normal.  Musculoskeletal: She exhibits no edema or tenderness.  Neurological: She is alert and oriented.  Right facial weakness with mild dysarthria.  Sensation intact to light touch throughout. Able to follow basic commands without difficulty.  Motor: LUE/LLE 5/5 proximal to distal  RUE: 3+/5 proximal to distal   RLE: 4/5 proximal to distal   Skin: Skin is warm and dry.  Psychiatric: She has a normal mood and affect. Her behavior is normal. Judgment and thought content normal   Assessment/Plan: 1. 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 Physiatrist is providing close team supervision and 24 hour management of active medical problems listed below. Physiatrist and rehab team continue to assess barriers to discharge/monitor patient progress toward functional and medical goals.  Function:  Bathing Bathing position   Position: Shower  Bathing parts Body parts bathed by patient: Right arm, Chest, Abdomen, Front perineal area, Buttocks, Right upper leg, Left upper leg, Right lower leg, Left lower leg, Left arm Body parts bathed by helper: Back  Bathing assist        Upper Body Dressing/Undressing Upper body dressing   What is the patient wearing?: Pull over shirt/dress     Pull over shirt/dress - Perfomed by patient: Thread/unthread right sleeve, Thread/unthread left sleeve, Put head through opening, Pull shirt over trunk          Upper body assist Assist Level: Set up      Lower Body Dressing/Undressing Lower body dressing   What is the patient wearing?: Pants, Underwear, Socks, Shoes Underwear - Performed by patient: Thread/unthread right underwear leg, Thread/unthread left underwear leg, Pull underwear up/down   Pants- Performed by patient: Thread/unthread right pants leg, Thread/unthread left pants leg, Pull pants up/down       Socks - Performed by patient:  Don/doff right sock, Don/doff left sock   Shoes - Performed by patient: Don/doff right shoe, Don/doff left shoe            Lower body assist Assist for lower body dressing: Touching or steadying assistance (Pt > 75%)      Toileting Toileting   Toileting steps completed by patient: Adjust clothing prior to toileting, Performs perineal hygiene, Adjust clothing after toileting      Toileting assist Assist level: Touching or steadying assistance (Pt.75%)   Transfers Chair/bed transfer   Chair/bed transfer method: Ambulatory Chair/bed transfer assist  level: Supervision or verbal cues Chair/bed transfer assistive device: Medical sales representative     Max distance: 140 ft Assist level: Touching or steadying assistance (Pt > 75%)   Wheelchair   Type: Manual Max wheelchair distance: 30 ft Assist Level: Touching or steadying assistance (Pt > 75%)  Cognition Comprehension Comprehension assist level: Follows complex conversation/direction with no assist  Expression Expression assist level: Expresses basic needs/ideas: With no assist  Social Interaction Social Interaction assist level: Interacts appropriately with others - No medications needed.  Problem Solving Problem solving assist level: Solves complex problems: Recognizes & self-corrects  Memory Memory assist level: Complete Independence: No helper    Medical Problem List and Plan: 1.  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   Cont CIR  2.  DVT Prophylaxis/Anticoagulation: Pharmaceutical: Lovenox 3. Pain Management: N/A 4. Mood: LCSW to follow for evaluation and support.  5. Neuropsych: This patient is capable of making decisions on her own behalf. 6. Skin/Wound Care: routine pressure relief mesures 7. Fluids/Electrolytes/Nutrition: Monitor I/Os 8. 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   Labile, but overall controlled 5/10 9. T2DM: Poorly controlled with Hgb A1c  Lantus increased to 40U on 5/9 with meal coverage.   Metformin 500 BID, pt cannot tolerate higher dose  Monitor BS ac/hs and titrate insulin as needed.   Monitor with increased activity  Remains elevated, will consider further increase tomorrow 10. Dyslipidemia: On Lipitor.  11. Transaminitis  Cont to monitor LFTs   LOS (Days) 3 A FACE TO FACE EVALUATION WAS PERFORMED  Shelley Frazier Shelley Frazier 10/07/2016 8:08 AM

## 2016-10-07 NOTE — Progress Notes (Signed)
Physical Therapy Session Note  Patient Details  Name: Shelley Frazier MRN: [id number] Date of Birth: July 01, 1965  Today's Date: 10/07/2016 PT Individual Time: 1410-1435 PT Individual Time Calculation (min): 25 min   Short Term Goals: Week 1:  PT Short Term Goal 1 (Week 1): STG=LTG  Skilled Therapeutic Interventions/Progress Updates:   Pt received sitting in WC and agreeable to PT  PT instructed pt in gait training over level surface x 135f [facility]. Pt noted to have 2 instances on mild knee buckling but able to correct without increased assistance. Intermittent genu recurvatum throughout gait. Gait over ramp and uneven mulched surface with min assist and SPC. Pt stabilized knee with each step by performed knee snap back, unable to correct with cues from PT.   Pt instructed pt in berg balance test. Patient demonstrates increased fall risk as noted by score of  39/56 on Berg Balance Scale.  (<36= high risk for falls, close to 100%; 37-45 significant >80%; 46-51 moderate >50%; 52-55 lower >25%)  [facility] and all needs met.       Therapy Documentation Precautions:  Precautions Precautions: Fall Precaution Comments: R hemi Restrictions Weight Bearing Restrictions: No Vital Signs: Therapy Vitals Temp: 98.4 F (36.9 C) Temp Source: Oral Pulse Rate: 77 Resp: 18 BP: (!) 162/73 Patient Position (if appropriate): Sitting Oxygen Therapy SpO2: 100 % O2 Device: Not Delivered Pain: Pain Assessment Pain Assessment: No/denies pain Balance: Balance Balance Assessed: Yes Standardized Balance Assessment Standardized Balance Assessment: Berg Balance Test Berg Balance Test Sit to Stand: Able to stand without using hands and stabilize independently Standing Unsupported: Able to stand safely 2 minutes Sitting with Back Unsupported but Feet Supported on Floor or Stool: Able to sit safely  and securely 2 minutes Stand to Sit: Controls descent by using hands Transfers: Able to transfer safely, definite need of hands Standing Unsupported with Eyes Closed: Able to stand 10 seconds with supervision Standing Ubsupported with Feet Together: Able to place feet together independently and stand for 1 minute with supervision From Standing, Reach Forward with Outstretched Arm: Can reach confidently >25 cm (10") From Standing Position, Pick up Object from Floor: Able to pick up shoe, needs supervision From Standing Position, Turn to Look Behind Over each Shoulder: Looks behind one side only/other side shows less weight shift Turn 360 Degrees: Needs close supervision or verbal cueing Standing Unsupported, Alternately Place Feet on Step/Stool: Able to complete >2 steps/needs minimal assist Standing Unsupported, One Foot in Front: Able to take small step independently and hold 30 seconds Standing on One Leg: Tries to lift leg/unable to hold 3 seconds but remains standing independently Total Score: 39   See Function Navigator for Current Functional Status.   Therapy/Group: Individual Therapy  ALorie Phenix5/02/2017, 2:35 PM

## 2016-10-07 NOTE — Progress Notes (Signed)
Occupational Therapy Session Note  Patient Details  Name: Shelley Frazier MRN: [id number] Date of Birth: [birth year]  Today's Date: 10/07/2016 OT Individual Time: 1100-1158 OT Individual Time Calculation (min): 58 min    Short Term Goals: Week 1:  OT Short Term Goal 1 (Week 1): STGs equal to LTGs set at overall modified independent level.  Skilled Therapeutic Interventions/Progress Updates:    Pt completed functional mobility to the ADL apartment with min assist and no assistive device.  Worked on stepping over simulated walk-in shower with min assist several attempts and discussed setup of shower and scenarios for transferring.  Once this was finished had pt complete ambulation to the therapy gym for use of UE ergonometer.  She completed 4 sets of 3 mins total with resistance on level 1 and RPMs 8-9.  First set was completed with use of both UEs.  Pt with increased difficulty maintaining grip with the RUE.  After first set applied ace bandage to help with sustained gripping.  The next two sets were completed peddling forward with isolated use of the RUE only.  Final set was for peddling in reverse with the RUE only.  Min cueing needed to help refrain from hiking the right shoulder.  Transitioned to sitting on therapy mat to work on picking up and placing larger 2" pegs in foam board using only the RUE.  Increased lean to the left and right shoulder hike noted when attempting to retrieve them from container on bedside table.  Lowered container down to help decrease this.  She still exhibits decreased in-hand manipulation when attempting to move them from palm to fingertips and needs assist from surface or her body.  Once finished had pt ambulate back to the room with min assist.  Discussed session with spouse as well as giving him the OK to assist her with shower when she wants using the RW and tub bench.    Therapy Documentation Precautions:  Precautions Precautions: Fall Precaution Comments: R  hemi Restrictions Weight Bearing Restrictions: No  Pain: Pain Assessment Pain Assessment: No/denies pain ADL: See Function Navigator for Current Functional Status.   Therapy/Group: Individual Therapy  Angelita Harnack OTR/L 10/07/2016, 12:01 PM

## 2016-10-07 NOTE — Progress Notes (Signed)
Speech Language Pathology Daily Session Note  Patient Details  Name: Shelley Frazier MRN: [id number] Date of Birth: [birth year]  Today's Date: 10/07/2016 SLP Individual Time: 0805-0900 SLP Individual Time Calculation (min): 55 min  Short Term Goals: Week 1: SLP Short Term Goal 1 (Week 1): short term goals to equal long term goals due to pt's anticipated length of stay.   Skilled Therapeutic Interventions:   Pt was seen for skilled ST targeting speech intelligibility goals.  Pt was able to achieve intelligibility at the conversational level while multi-tasking in a variety of environments (I.e. While walking, with increased environmental/background noise, during a functional activity) with mod I for the duration of today's therapy session.  Pt would benefit from 1 additional therapy session to solidify use of strategies prior to discharge from [place] services.  Pt will not need OP ST follow up.  Discussed with pt who is in agreement with recommendations.  Pt was left in recliner with husband at bedside.  Continue per current plan of care.       Function:  Eating Eating                 Cognition Comprehension Comprehension assist level: Follows complex conversation/direction with no assist  Expression   Expression assist level: Expresses complex ideas: With extra time/assistive device  Social Interaction Social Interaction assist level: Interacts appropriately with others - No medications needed.  Problem Solving Problem solving assist level: Solves complex problems: Recognizes & self-corrects  Memory Memory assist level: Complete Independence: No helper    Pain Pain Assessment Pain Assessment: No/denies pain  Therapy/Group: Individual Therapy  Shelley Frazier, Elmyra Ricks L 10/07/2016, 9:15 AM

## 2016-10-07 NOTE — Progress Notes (Signed)
Physical Therapy Session Note  Patient Details  Name: Shelley Frazier MRN: [id number] Date of Birth: 1966-04-26  Today's Date: 10/07/2016 PT Individual Time: 8756-4332 PT Individual Time Calculation (min): 42 min   Short Term Goals: Week 1:  PT Short Term Goal 1 (Week 1): STG=LTG  Skilled Therapeutic Interventions/Progress Updates:    Pt sitting in chair, in room upon arrival. Pt agreeable to PT session. Transfers: sit<>stand with supervision, consistent hand placement. Ambulation: pt ambulating initially with rw to gym. Trial use of SPC, 75 ft X3. Working on Rt foot clearance with swing phase, decreased clearance with increasing fatigue. Pt with 3 loss of balance with independent recovery while using SPC. Reinforce use of cane during therapy sessions only. Exercise: educated pt and spouse on Rt dorsiflexion stretch both with sheet and manually. Performed by therapist prior to ambulation. Following session, pt returned to room with spouse present. All needs in reach.   Therapy Documentation Precautions:  Precautions Precautions: Fall Precaution Comments: R hemi Restrictions Weight Bearing Restrictions: No  Pain: 4/10 in Rt wrist, meds provided during session.     See Function Navigator for Current Functional Status.   Therapy/Group: Individual Therapy  Linard Millers, PT 10/07/2016, 2:42 PM

## 2016-10-07 NOTE — Progress Notes (Signed)
Nutrition Brief Note  Consult received for diet education.  Pt currently out of room.  Left written materials and contact information at bedside.  DM coordinator also following.  Will follow up for questions as needed.   [place], Shelley Frazier, [employer] Pager [phone] After Hours Pager

## 2016-10-08 ENCOUNTER — Inpatient Hospital Stay (HOSPITAL_COMMUNITY): Payer: BC Managed Care – PPO | Admitting: Occupational Therapy

## 2016-10-08 ENCOUNTER — Inpatient Hospital Stay (HOSPITAL_COMMUNITY): Payer: BC Managed Care – PPO

## 2016-10-08 ENCOUNTER — Inpatient Hospital Stay (HOSPITAL_COMMUNITY): Payer: BC Managed Care – PPO | Admitting: Physical Therapy

## 2016-10-08 ENCOUNTER — Inpatient Hospital Stay (HOSPITAL_COMMUNITY): Payer: BC Managed Care – PPO | Admitting: Speech Pathology

## 2016-10-08 DIAGNOSIS — K909 Intestinal malabsorption, unspecified: Secondary | ICD-10-CM | POA: Insufficient documentation

## 2016-10-08 DIAGNOSIS — R197 Diarrhea, unspecified: Secondary | ICD-10-CM

## 2016-10-08 LAB — C DIFFICILE QUICK SCREEN W PCR REFLEX
C DIFFICLE (CDIFF) ANTIGEN: NEGATIVE
C Diff interpretation: NOT DETECTED
C Diff toxin: NEGATIVE

## 2016-10-08 LAB — GLUCOSE, CAPILLARY
GLUCOSE-CAPILLARY: 203 mg/dL — AB (ref 65–99)
GLUCOSE-CAPILLARY: 212 mg/dL — AB (ref 65–99)
Glucose-Capillary: 178 mg/dL — ABNORMAL HIGH (ref 65–99)
Glucose-Capillary: 121 mg/dL — ABNORMAL HIGH (ref 65–99)

## 2016-10-08 MED ORDER — LOPERAMIDE HCL 2 MG PO CAPS
2.0000 mg | ORAL_CAPSULE | ORAL | Status: DC | PRN
  Administered 2016-10-08 – 2016-10-09 (×2): 2 mg via ORAL
  Filled 2016-10-08 (×3): qty 1

## 2016-10-08 MED ORDER — INSULIN GLARGINE 100 UNIT/ML ~~LOC~~ SOLN
48.0000 [IU] | Freq: Every day | SUBCUTANEOUS | Status: DC
  Administered 2016-10-08 – 2016-10-11 (×4): 48 [IU] via SUBCUTANEOUS
  Filled 2016-10-08 (×4): qty 0.48

## 2016-10-08 MED ORDER — GLUCERNA SHAKE PO LIQD
237.0000 mL | ORAL | Status: DC
  Administered 2016-10-08: 237 mL via ORAL

## 2016-10-08 MED ORDER — INSULIN GLARGINE 100 UNIT/ML ~~LOC~~ SOLN: 45.0000 [IU] | Freq: Every day | SUBCUTANEOUS | Status: DC

## 2016-10-08 NOTE — Progress Notes (Signed)
Addendum: Upon speaking with patient and husband was informed that GI issue -diarrheal stools has occurred before, no medical treatment,,informed to collect specimen upon next occurrence.

## 2016-10-08 NOTE — Plan of Care (Signed)
Problem: RH PAIN MANAGEMENT Goal: RH STG PAIN MANAGED AT OR BELOW PT'S PAIN GOAL Outcome: Progressing Pain less than or equal to 2.   

## 2016-10-08 NOTE — Progress Notes (Signed)
Speech Language Pathology Discharge Summary  Patient Details  Name: Shelley Frazier MRN: [id number] Date of Birth: 1966-01-06  Today's Date: 10/08/2016 SLP Individual Time: 5423-7023 SLP Individual Time Calculation (min): 50 min   Skilled Therapeutic Interventions:  Pt was seen for skilled ST targeting intelligibility goals.  SLP facilitated the session with a verbal reasoning task in a moderately distracting and noisy environment to address use of intelligibility strategies at the conversational level.  Pt again was mod I for use of compensatory strategies to achieve intelligibility in conversations with the therapist.  Pt endorses feeling satisfied overall with the way her speech sounds and that her friends and husband report no difficulty understanding her.  As a result, recommend discharging pt from [facility] services at this time.  Pt and husband are in agreement with recommended plan of care.      Patient has met 1 of 1 long term goals.  Patient to discharge at overall Modified Independent level.  Reasons goals not met:     Clinical Impression/Discharge Summary:   Pt is being discharged from [place] services at an overall mod I level for use of intelligibility strategies at the conversational level.  Pt and family education is complete at this time. No further ST needs are warranted.    Care Partner:  Caregiver Able to Provide Assistance:  (n/a)  Type of Caregiver Assistance:  (n/a)  Recommendation:  None      Equipment: none recommended by SLP    Reasons for discharge: Treatment goals met   Patient/Family Agrees with Progress Made and Goals Achieved: Yes   Function:  Eating Eating                 Cognition Comprehension Comprehension assist level: Follows complex conversation/direction with no assist  Expression   Expression assist level: Expresses complex ideas: With extra time/assistive device  Social Interaction Social Interaction assist level: Interacts appropriately  with others - No medications needed.  Problem Solving Problem solving assist level: Solves complex problems: Recognizes & self-corrects  Memory Memory assist level: Complete Independence: No helper   Emilio Math 10/08/2016, 12:32 PM

## 2016-10-08 NOTE — Progress Notes (Signed)
[place] PHYSICAL MEDICINE & REHABILITATION     PROGRESS NOTE  Subjective/Complaints:  Pt seen sitting up in her chair this AM.  She states she did not sleep well overnight due to diarrhea as a result of the salad she had last night.     ROS: +Diarrhea. Denies CP, SOB, N/V.  Objective: Vital Signs: Blood pressure (!) 153/84, pulse 90, temperature 98.4 F (36.9 C), temperature source Oral, resp. rate 18, height 5\' 3"  (1.6 m), weight 77.6 kg (171 lb 1 oz), last menstrual period 04/18/2013, SpO2 100 %. No results found. No results for input(s): WBC, HGB, HCT, PLT in the last 72 hours. No results for input(s): NA, K, CL, GLUCOSE, BUN, CREATININE, CALCIUM in the last 72 hours.  Invalid input(s): CO CBG (last 3)   Recent Labs  10/07/16 1645 10/07/16 2027 10/08/16 0619  GLUCAP 164* 154* 203*    Wt Readings from Last 3 Encounters:  10/04/16 77.6 kg (171 lb 1 oz)  10/01/16 78.4 kg (172 lb 12.8 oz)  08/30/16 81.2 kg (179 lb)    Physical Exam:  BP (!) 153/84 (BP Location: Left Arm)   Pulse 90   Temp 98.4 F (36.9 C) (Oral)   Resp 18   Ht 5\' 3"  (1.6 m)   Wt 77.6 kg (171 lb 1 oz)   LMP 04/18/2013   SpO2 100%   BMI 30.30 kg/m  Constitutional: She appears well-developed and well-nourished. NAD. HENT: Normocephalic and atraumatic.  Eyes: EOMI. No discharge.  Cardiovascular: RRR.  No JVD. Respiratory: Effort normal and breath sounds normal.  GI: Soft. Bowel sounds are normal.  Musculoskeletal: She exhibits no edema or tenderness.  Neurological: She is alert and oriented.  Right facial weakness with mild dysarthria.  Sensation intact to light touch throughout. Able to follow basic commands without difficulty.  Motor: LUE/LLE 5/5 proximal to distal  RUE: 3+/5 proximal to distal  (stable) RLE: 4/5 proximal to distal   Skin: Skin is warm and dry.  Psychiatric: She has a normal mood and affect. Her behavior is normal. Judgment and thought content  normal   Assessment/Plan: 1. Functional deficits secondary to acute pontine infact which require 3+ hours per day of interdisciplinary therapy in a comprehensive inpatient rehab setting. Physiatrist is providing close team supervision and 24 hour management of active medical problems listed below. Physiatrist and rehab team continue to assess barriers to discharge/monitor patient progress toward functional and medical goals.  Function:  Bathing Bathing position   Position: Shower  Bathing parts Body parts bathed by patient: Right arm, Chest, Abdomen, Front perineal area, Buttocks, Right upper leg, Left upper leg, Right lower leg, Left lower leg, Left arm Body parts bathed by helper: Back  Bathing assist        Upper Body Dressing/Undressing Upper body dressing   What is the patient wearing?: Pull over shirt/dress     Pull over shirt/dress - Perfomed by patient: Thread/unthread right sleeve, Thread/unthread left sleeve, Put head through opening, Pull shirt over trunk          Upper body assist Assist Level: Set up      Lower Body Dressing/Undressing Lower body dressing   What is the patient wearing?: Pants, Underwear, Socks, Shoes Underwear - Performed by patient: Thread/unthread right underwear leg, Thread/unthread left underwear leg, Pull underwear up/down   Pants- Performed by patient: Thread/unthread right pants leg, Thread/unthread left pants leg, Pull pants up/down       Socks - Performed by patient: Don/doff right  sock, Don/doff left sock   Shoes - Performed by patient: Don/doff right shoe, Don/doff left shoe            Lower body assist Assist for lower body dressing: Touching or steadying assistance (Pt > 75%)      Toileting Toileting   Toileting steps completed by patient: Adjust clothing prior to toileting, Performs perineal hygiene, Adjust clothing after toileting Toileting steps completed by helper: Performs perineal hygiene (per Jobelle Mesias, NT  report)    Toileting assist Assist level: Touching or steadying assistance (Pt.75%)   Transfers Chair/bed transfer   Chair/bed transfer method: Ambulatory Chair/bed transfer assist level: Touching or steadying assistance (Pt > 75%) Chair/bed transfer assistive device: Librarian, academic     Max distance: 75 ft Assist level: Touching or steadying assistance (Pt > 75%)   Wheelchair   Type: Manual Max wheelchair distance: 30 ft Assist Level: Touching or steadying assistance (Pt > 75%)  Cognition Comprehension Comprehension assist level: Follows complex conversation/direction with no assist  Expression Expression assist level: Expresses complex ideas: With extra time/assistive device  Social Interaction Social Interaction assist level: Interacts appropriately with others - No medications needed.  Problem Solving Problem solving assist level: Solves complex problems: Recognizes & self-corrects  Memory Memory assist level: Complete Independence: No helper    Medical Problem List and Plan: 1.  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   Cont CIR  2.  DVT Prophylaxis/Anticoagulation: Pharmaceutical: Lovenox 3. Pain Management: N/A 4. Mood: LCSW to follow for evaluation and support.  5. Neuropsych: This patient is capable of making decisions on her own behalf. 6. Skin/Wound Care: routine pressure relief mesures 7. Fluids/Electrolytes/Nutrition: Monitor I/Os 8. 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   Overall in desired range 9. T2DM: Poorly controlled with Hgb A1c  Lantus increased to 40U on 5/9, increased to 48U on 5/11 with meal coverage.   Metformin 500 BID, pt cannot tolerate higher dose  Monitor BS ac/hs and titrate insulin as needed.   Monitor with increased activity 10. Dyslipidemia: On Lipitor.  11. Transaminitis  Cont to monitor LFTs 12. Diarrhea  Appears to be secondary to  malabsorption  Will cont to monitor   LOS (Days) 4 A FACE TO FACE EVALUATION WAS PERFORMED  Ankit Lorie Phenix 10/08/2016 8:55 AM

## 2016-10-08 NOTE — Progress Notes (Signed)
Physical Therapy Session Note  Patient Details  Name: BRIEA MCENERY MRN: [id number] Date of Birth: Jan 25, 1966  Today's Date: 10/08/2016 PT Individual Time: 8088-1103 PT Individual Time Calculation (min): 31 min   Short Term Goals: Week 1:  PT Short Term Goal 1 (Week 1): STG=LTG  Skilled Therapeutic Interventions/Progress Updates:    Pt sitting in chair upon arrival, agreeable to PT session. Gait: pt ambulating variable distances throughout unit using SPC (max 210 ft). Pt with 5 mild loss of balance with independent recovery. Occasional foot drop on Rt but approximately 90% clearance with swing phase. Balance: static standing with progression into narrowing BOS >staggered stance>heel toe. Progressing with head and shoulder turns. Heel-toe walk in parallel bars and min assist. Pt ambulating back to room after session, sitting in chair with spouse present and all needs in reach.   Therapy Documentation Precautions:  Precautions Precautions: Fall Precaution Comments: R hemi Restrictions Weight Bearing Restrictions: No  Pain: Denies pain  See Function Navigator for Current Functional Status.   Therapy/Group: Individual Therapy  Linard Millers, PT 10/08/2016, 4:11 PM

## 2016-10-08 NOTE — Progress Notes (Signed)
Upon rounding spouse informed staff* NT) that patient has had a total of approximately 10-11 loose diarrheal stool throughout the night,writer  notified on call Danella Sensing, [place] , no new orders,instructed to communicate this information to [place], [place] for additional assessment/orders. Dan,PA made aware order received.Marland KitchenMarland Kitchen

## 2016-10-08 NOTE — Progress Notes (Signed)
Occupational Therapy Session Note  Patient Details  Name: Shelley Frazier MRN: [id number] Date of Birth: August 08, 1965  Today's Date: 10/08/2016 OT Individual Time: 3606-7703 OT Individual Time Calculation (min): 55 min    Short Term Goals: Week 1:  OT Short Term Goal 1 (Week 1): STGs equal to LTGs set at overall modified independent level.  Skilled Therapeutic Interventions/Progress Updates:    1:1. Focus of session on use of RUE, weight bearing through RUE, funcitonal mobility and shower transfer. Pt ambulates throughout session using SPC with supervision with VC for cane sequencing and foot clearance. Pt transfer into/out of shower stall with MIN A for balance and VC for sequencing/safety awareness. In kitchen pt wipes countertops/table with RUE in standing with supervision to weight bear through hand ~8 min with tactile for relaxing shoulders d/t tendency to elevate. Pt in day room, pt assumes quadroped to weight bear through RUE while assembling puzzle for ~5 min at a time prior to needing rest break. Pt becomes tearful, and OT uses empathetic listening and provides support and encouragement. Pt finishes puzzle with LUE in standing weight bearing through table using RUE with VC to relax shoulder. Pt pulls pieces apart with RUE and places into bag on L. Exited session with pt seated in recliner with call light in reach.   Therapy Documentation Precautions:  Precautions Precautions: Fall Precaution Comments: R hemi Restrictions Weight Bearing Restrictions: No  See Function Navigator for Current Functional Status.   Therapy/Group: Individual Therapy  [name] 10/08/2016, 10:32 AM

## 2016-10-08 NOTE — Plan of Care (Signed)
Problem: RH Other (Specify) Goal: RH LTG Other (Specify)1 Outcome: Not Applicable Date Met: 60/60/04 Goal not addressed per discussion with pt where she indicated that she felt she could work on her handwriting independently as her UE strength and coordination improved.

## 2016-10-08 NOTE — Progress Notes (Signed)
Occupational Therapy Session Note  Patient Details  Name: CEILIDH TORREGROSSA MRN: [id number] Date of Birth: 03/29/1966  Today's Date: 10/08/2016 OT Individual Time: 1300-1400 OT Individual Time Calculation (min): 60 min    Short Term Goals: Week 1:  OT Short Term Goal 1 (Week 1): STGs equal to LTGs set at overall modified independent level.  Skilled Therapeutic Interventions/Progress Updates:    Pt completed functional mobility to the ADL kitchen with supervision using the single point cane.  She was able to work on simple meal prep activity of making muffins using her RUE as an active assist throughout session, reaching into cabinets and holding the mixing bowl while mixing.  Min assist needed for reaching to upper cabinets with the RUE as well as for holding mixing bowel on its side in order to pour batter into the muffin tin.  Rest break needed after placing muffins into the oven.  Educated pt on proper technique for reaching into lower cabinets and for placing items into the oven.  Session completed with ambulation back to the room with call button and phone in reach and spouse present.   Therapy Documentation Precautions:  Precautions Precautions: Fall Precaution Comments: R hemi Restrictions Weight Bearing Restrictions: No  Pain: Pain Assessment Pain Assessment: 0-10 Pain Score: 0-No pain ADL: See Function Navigator for Current Functional Status.   Therapy/Group: Individual Therapy  Jamarques Pinedo OTR/L 10/08/2016, 3:47 PM

## 2016-10-08 NOTE — Progress Notes (Signed)
Patient complaining for diarrhea 5x today. C-diff was negative. MD notified with order for imodium 2mg  q4 prn.

## 2016-10-09 ENCOUNTER — Inpatient Hospital Stay (HOSPITAL_COMMUNITY): Payer: BC Managed Care – PPO

## 2016-10-09 ENCOUNTER — Inpatient Hospital Stay (HOSPITAL_COMMUNITY): Payer: BC Managed Care – PPO | Admitting: Physical Therapy

## 2016-10-09 LAB — GLUCOSE, CAPILLARY
GLUCOSE-CAPILLARY: 81 mg/dL (ref 65–99)
Glucose-Capillary: 124 mg/dL — ABNORMAL HIGH (ref 65–99)
Glucose-Capillary: 183 mg/dL — ABNORMAL HIGH (ref 65–99)
Glucose-Capillary: 122 mg/dL — ABNORMAL HIGH (ref 65–99)

## 2016-10-09 NOTE — Progress Notes (Signed)
Physical Therapy Session Note  Patient Details  Name: TENEISHA GIGNAC MRN: [id number] Date of Birth: 08-20-1965  Today's Date: 10/09/2016 PT Individual Time: 0800-0915 PT Individual Time Calculation (min): 75 min   Short Term Goals: Week 1:  PT Short Term Goal 1 (Week 1): STG=LTG  Skilled Therapeutic Interventions/Progress Updates:  Pt was seen bedside in the am. Pt performed all sit to stand and stand pivot transfers with S. Pt ambulated 210 feet x 2, pt had one LOB requiring min guard to correct otherwise S. In gym treatment focused on NMR utilizing cone taps, alternating cone taps, criss cross con taps 3 sets x 10 reps each. Also focused on high level gait activities: slalom cone course 50 feet x 3 and side stepping 3 sets x 25 to R/L. Pt rode Nu-step 10 minutes at level 72for aerobic conditioning. Pt returned to room following treatment and left sitting up in chair with family at bedside.   Therapy Documentation Precautions:  Precautions Precautions: Fall Precaution Comments: R hemi Restrictions Weight Bearing Restrictions: No General:   Pain: No c/o pain.   See Function Navigator for Current Functional Status.   Therapy/Group: Individual Therapy  Dub Amis 10/09/2016, 12:40 PM

## 2016-10-09 NOTE — Progress Notes (Signed)
[facility] PHYSICAL MEDICINE & REHABILITATION     PROGRESS NOTE  Subjective/Complaints:  Lying in bed. No new complaints. Slept well. No diarrhea reported  ROS: pt denies nausea, vomiting, diarrhea, cough, shortness of breath or chest pain   Objective: Vital Signs: Blood pressure 129/64, pulse 76, temperature 98.5 F (36.9 C), temperature source Oral, resp. rate 18, height 5\' 3"  (1.6 m), weight 77.6 kg (171 lb 1 oz), last menstrual period 04/18/2013, SpO2 96 %. No results found. No results for input(s): WBC, HGB, HCT, PLT in the last 72 hours. No results for input(s): NA, K, CL, GLUCOSE, BUN, CREATININE, CALCIUM in the last 72 hours.  Invalid input(s): CO CBG (last 3)   Recent Labs  10/08/16 1640 10/08/16 2052 10/09/16 0633  GLUCAP 178* 121* 124*    Wt Readings from Last 3 Encounters:  10/04/16 77.6 kg (171 lb 1 oz)  10/01/16 78.4 kg (172 lb 12.8 oz)  08/30/16 81.2 kg (179 lb)    Physical Exam:  BP 129/64 (BP Location: Left Arm)   Pulse 76   Temp 98.5 F (36.9 C) (Oral)   Resp 18   Ht 5\' 3"  (1.6 m)   Wt 77.6 kg (171 lb 1 oz)   LMP 04/18/2013   SpO2 96%   BMI 30.30 kg/m  Constitutional: She appears well-developed and well-nourished. NAD. HENT: Normocephalic and atraumatic.  Eyes: EOMI. No discharge.  Cardiovascular: RRR without JVD. Respiratory: CTA B GI: Soft. Bowel sounds are normal.  Musculoskeletal: She exhibits no edema or tenderness.  Neurological: She is alert and oriented.  Right facial weakness with mild dysarthria.  Sensation intact to light touch throughout. Able to follow basic commands without difficulty.  Motor: LUE/LLE 5/5 proximal to distal  RUE: 3+/5 proximal to distal  (no change) RLE: 4/5 proximal to distal   Skin: Skin is warm and dry.  Psychiatric: She has a normal mood and affect. Her behavior is normal. Judgment and thought content normal   Assessment/Plan: 1. Functional deficits secondary to acute pontine infact which require 3+  hours per day of interdisciplinary therapy in a comprehensive inpatient rehab setting. Physiatrist is providing close team supervision and 24 hour management of active medical problems listed below. Physiatrist and rehab team continue to assess barriers to discharge/monitor patient progress toward functional and medical goals.  Function:  Bathing Bathing position   Position: Shower  Bathing parts Body parts bathed by patient: Right arm, Chest, Abdomen, Front perineal area, Buttocks, Right upper leg, Left upper leg, Right lower leg, Left lower leg, Left arm Body parts bathed by helper: Back  Bathing assist        Upper Body Dressing/Undressing Upper body dressing   What is the patient wearing?: Pull over shirt/dress     Pull over shirt/dress - Perfomed by patient: Thread/unthread right sleeve, Thread/unthread left sleeve, Put head through opening, Pull shirt over trunk          Upper body assist Assist Level: Set up      Lower Body Dressing/Undressing Lower body dressing   What is the patient wearing?: Pants, Underwear, Socks, Shoes Underwear - Performed by patient: Thread/unthread right underwear leg, Thread/unthread left underwear leg, Pull underwear up/down   Pants- Performed by patient: Thread/unthread right pants leg, Thread/unthread left pants leg, Pull pants up/down       Socks - Performed by patient: Don/doff right sock, Don/doff left sock   Shoes - Performed by patient: Don/doff right shoe, Don/doff left shoe  Lower body assist Assist for lower body dressing: Touching or steadying assistance (Pt > 75%)      Toileting Toileting   Toileting steps completed by patient: Adjust clothing prior to toileting, Performs perineal hygiene, Adjust clothing after toileting Toileting steps completed by helper: Performs perineal hygiene (per Jobelle Mesias, NT report) Toileting Assistive Devices: Grab bar or rail  Toileting assist Assist level: Supervision or  verbal cues   Transfers Chair/bed transfer   Chair/bed transfer method: Ambulatory Chair/bed transfer assist level: Supervision or verbal cues Chair/bed transfer assistive device: Armrests, Librarian, academic     Max distance: 210 ft Assist level: Supervision or verbal cues   Wheelchair   Type: Manual Max wheelchair distance: 30 ft Assist Level: Touching or steadying assistance (Pt > 75%)  Cognition Comprehension Comprehension assist level: Follows complex conversation/direction with no assist  Expression Expression assist level: Expresses complex ideas: With extra time/assistive device  Social Interaction Social Interaction assist level: Interacts appropriately with others - No medications needed.  Problem Solving Problem solving assist level: Solves complex problems: Recognizes & self-corrects  Memory Memory assist level: Complete Independence: No helper    Medical Problem List and Plan: 1.  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   Cont CIR  2.  DVT Prophylaxis/Anticoagulation: Pharmaceutical: Lovenox 3. Pain Management: N/A 4. Mood: LCSW to follow for evaluation and support.  5. Neuropsych: This patient is capable of making decisions on her own behalf. 6. Skin/Wound Care: routine pressure relief mesures 7. Fluids/Electrolytes/Nutrition: Monitor I/Os 8. 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   Overall in desired range 9. T2DM: Poorly controlled with Hgb A1c  Lantus increased to 40U on 5/9, increased to 48U on 5/11 with meal coverage.   Metformin at 500 BID, pt cannot tolerate higher dose  CBG's have improved over last 24 hours with above adjustments 10. Dyslipidemia: On Lipitor.  11. Transaminitis  Cont to monitor LFTs 12. Diarrhea  -improved since yesterday--observe   LOS (Days) 5 A FACE TO FACE EVALUATION WAS PERFORMED  Jospeh Mangel T 10/09/2016 8:30 AM

## 2016-10-09 NOTE — Progress Notes (Signed)
Physical Therapy Session Note  Patient Details  Name: Shelley Frazier MRN: [id number] Date of Birth: February 19, 1966  Today's Date: 10/09/2016 PT Individual Time: 1415-1500 PT Individual Time Calculation (min): 45 min   Short Term Goals: Week 1:  PT Short Term Goal 1 (Week 1): STG=LTG  Skilled Therapeutic Interventions/Progress Updates:  Pt was seen bedside in the pm. Pt performed all sit to stand transfers with S. Pt ambulated 210 feet with st cane and S. Pt rode Nu-step x 10 minutes on level 3. Pt ascended/descended 9 stairs with 1 rail and min A. Pt ambulated backwards 25 feet x 3 with min A and verbal cues with st cane. Pt returned to room and left sitting up in w/c with family at bedside.   Therapy Documentation Precautions:  Precautions Precautions: Fall Precaution Comments: R hemi Restrictions Weight Bearing Restrictions: No General:   Pain: No c/o pain.   See Function Navigator for Current Functional Status.   Therapy/Group: Individual Therapy  Dub Amis 10/09/2016, 3:45 PM

## 2016-10-09 NOTE — Progress Notes (Signed)
Occupational Therapy Session Note  Patient Details  Name: Shelley Frazier MRN: [id number] Date of Birth: [birth year]  Today's Date: 10/09/2016 OT Individual Time: 1030-1057 2241-1464 OT Individual Time Calculation (min): 27 min  And 54 min   Short Term Goals: Week 1:  OT Short Term Goal 1 (Week 1): STGs equal to LTGs set at overall modified independent level.  Skilled Therapeutic Interventions/Progress Updates:    1:1. No pain reported, focus of session on funcitonal mobility, RUE use and [place]. Pt ambulates throughout session with RW and supervision with VC for foot clearance and safety awareness. In standing pt plays game of connect four with RUE with supervision and VC to relax shoulder elevation. In sitting pt completes clothing fasteners with increased time and Vc for BUE use. Exited session with pt seated in recliner with family in room and all needs met.   Session 2: 1:1. Pt ambulates throughout session to/from all therapeutic destinations on eve and uneven surfaces without rest breaks to improve endurance using SPC with supervision and Vc for cane management, weight shifting and foot clearance. While outside, pt learns new rules of game, take turns, calculates/writes score and manipulates pieces with RUE. Pt initially requires MIN VC for scoring and rule following fading to supervision. Pt requires increased time to manipulate pieces d/t decreased grip and [place]. Exited session with pt seated in w/c with call light in reach and all needs met.   Therapy Documentation Precautions:  Precautions Precautions: Fall Precaution Comments: R hemi Restrictions Weight Bearing Restrictions: No  See Function Navigator for Current Functional Status.   Therapy/Group: Individual Therapy  [name] 10/09/2016, 12:21 PM

## 2016-10-10 ENCOUNTER — Inpatient Hospital Stay (HOSPITAL_COMMUNITY): Payer: BC Managed Care – PPO | Admitting: Physical Therapy

## 2016-10-10 ENCOUNTER — Inpatient Hospital Stay (HOSPITAL_COMMUNITY): Payer: BC Managed Care – PPO

## 2016-10-10 LAB — GLUCOSE, CAPILLARY
Glucose-Capillary: 88 mg/dL (ref 65–99)
Glucose-Capillary: 117 mg/dL — ABNORMAL HIGH (ref 65–99)
Glucose-Capillary: 129 mg/dL — ABNORMAL HIGH (ref 65–99)
Glucose-Capillary: 136 mg/dL — ABNORMAL HIGH (ref 65–99)

## 2016-10-10 NOTE — Progress Notes (Signed)
Physical Therapy Session Note  Patient Details  Name: Shelley Frazier MRN: [id number] Date of Birth: April 30, 1966  Today's Date: 10/10/2016 PT Individual Time: 1400-1430 PT Individual Time Calculation (min): 30 min   Short Term Goals: Week 1:  PT Short Term Goal 1 (Week 1): STG=LTG  Skilled Therapeutic Interventions/Progress Updates:  Pt was seen bedside in the pm. Pt performed all transfers with S and increased time. Pt rode Nu-step 15 minutes at level 3 with no rest for aerobic conditioning. Pt ambulated 150 feet and 600 with SPC and S to min guard. Pt required min guard once while ambulating 600 feet secondary to fatigue with decreased clearance noted during R swing phase. Pt returned to room and left sitting up in chair with family at bedside.   Therapy Documentation Precautions:  Precautions Precautions: Fall Precaution Comments: R hemi Restrictions Weight Bearing Restrictions: No General:   Pain: Pain Assessment Pain Score: 0-No pain  See Function Navigator for Current Functional Status.   Therapy/Group: Individual Therapy  Dub Amis 10/10/2016, 2:40 PM

## 2016-10-10 NOTE — Progress Notes (Signed)
[place] PHYSICAL MEDICINE & REHABILITATION     PROGRESS NOTE  Subjective/Complaints:  Just waking up. Denies pain. Feels well. Happy that she no longer has diarrhea. Therapy progressing  ROS: pt denies nausea, vomiting, diarrhea, cough, shortness of breath or chest pain  Objective: Vital Signs: Blood pressure 121/60, pulse 70, temperature 98.6 F (37 C), temperature source Oral, resp. rate 18, height 5\' 3"  (1.6 m), weight 77.6 kg (171 lb 1 oz), last menstrual period 04/18/2013, SpO2 99 %. No results found. No results for input(s): WBC, HGB, HCT, PLT in the last 72 hours. No results for input(s): NA, K, CL, GLUCOSE, BUN, CREATININE, CALCIUM in the last 72 hours.  Invalid input(s): CO CBG (last 3)   Recent Labs  10/09/16 1657 10/09/16 2049 10/10/16 0703  GLUCAP 122* 81 88    Wt Readings from Last 3 Encounters:  10/04/16 77.6 kg (171 lb 1 oz)  10/01/16 78.4 kg (172 lb 12.8 oz)  08/30/16 81.2 kg (179 lb)    Physical Exam:  BP 121/60 (BP Location: Right Arm)   Pulse 70   Temp 98.6 F (37 C) (Oral)   Resp 18   Ht 5\' 3"  (1.6 m)   Wt 77.6 kg (171 lb 1 oz)   LMP 04/18/2013   SpO2 99%   BMI 30.30 kg/m  Constitutional: She appears well-developed and well-nourished. NAD. HENT: Normocephalic and atraumatic.  Eyes: EOMI. No discharge.  Cardiovascular: RRR no JVD. Respiratory: CTA B with normal effort GI: Soft. Bowel sounds are normal.  Musculoskeletal: She exhibits no edema or tenderness.  Neurological: She is alert and oriented.  Right facial weakness with mild dysarthria--stable  Sensation intact to light touch throughout. Able to follow basic commands without difficulty.  Motor: LUE/LLE 5/5 proximal to distal  RUE: 3+/5 proximal to distal  (stable) RLE: 4/5 proximal to distal   Skin: Skin is warm and dry.  Psychiatric: She has a normal mood and affect. Her behavior is normal. Judgment and thought content normal   Assessment/Plan: 1. Functional deficits  secondary to acute pontine infact which require 3+ hours per day of interdisciplinary therapy in a comprehensive inpatient rehab setting. Physiatrist is providing close team supervision and 24 hour management of active medical problems listed below. Physiatrist and rehab team continue to assess barriers to discharge/monitor patient progress toward functional and medical goals.  Function:  Bathing Bathing position   Position: Shower  Bathing parts Body parts bathed by patient: Right arm, Chest, Abdomen, Front perineal area, Buttocks, Right upper leg, Left upper leg, Right lower leg, Left lower leg, Left arm Body parts bathed by helper: Back  Bathing assist        Upper Body Dressing/Undressing Upper body dressing   What is the patient wearing?: Pull over shirt/dress     Pull over shirt/dress - Perfomed by patient: Thread/unthread right sleeve, Thread/unthread left sleeve, Put head through opening, Pull shirt over trunk          Upper body assist Assist Level: Set up      Lower Body Dressing/Undressing Lower body dressing   What is the patient wearing?: Pants, Underwear, Socks, Shoes Underwear - Performed by patient: Thread/unthread right underwear leg, Thread/unthread left underwear leg, Pull underwear up/down   Pants- Performed by patient: Thread/unthread right pants leg, Thread/unthread left pants leg, Pull pants up/down       Socks - Performed by patient: Don/doff right sock, Don/doff left sock   Shoes - Performed by patient: Don/doff right shoe, Don/doff left  shoe            Lower body assist Assist for lower body dressing: Touching or steadying assistance (Pt > 75%)      Toileting Toileting   Toileting steps completed by patient: Adjust clothing prior to toileting, Performs perineal hygiene, Adjust clothing after toileting Toileting steps completed by helper: Performs perineal hygiene (per Jobelle Mesias, NT report) Toileting Assistive Devices: Grab bar or rail   Toileting assist Assist level: Supervision or verbal cues   Transfers Chair/bed transfer   Chair/bed transfer method: Ambulatory Chair/bed transfer assist level: Supervision or verbal cues Chair/bed transfer assistive device: Armrests, Medical sales representative     Max distance: 210 Assist level: Supervision or verbal cues   Wheelchair   Type: Manual Max wheelchair distance: 30 ft Assist Level: Touching or steadying assistance (Pt > 75%)  Cognition Comprehension Comprehension assist level: Follows complex conversation/direction with no assist  Expression Expression assist level: Expresses complex ideas: With no assist  Social Interaction Social Interaction assist level: Interacts appropriately with others - No medications needed.  Problem Solving Problem solving assist level: Solves complex problems: Recognizes & self-corrects  Memory Memory assist level: Complete Independence: No helper    Medical Problem List and Plan: 1.  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   Cont CIR  2.  DVT Prophylaxis/Anticoagulation: Pharmaceutical: Lovenox 3. Pain Management: N/A 4. Mood: LCSW to follow for evaluation and support.  5. Neuropsych: This patient is capable of making decisions on her own behalf. 6. Skin/Wound Care: routine pressure relief mesures 7. Fluids/Electrolytes/Nutrition: Monitor I/Os 8. 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   Overall BP's have improved 9. T2DM: Poorly controlled with Hgb A1c  Lantus increased to 40U on 5/9, increased to 48U on 5/11 with meal coverage.   Metformin at 500 BID, pt cannot tolerate higher dose  CBG's have improved over weekend. Became a little hypoglycemic yesterday---observe for pattern 10. Dyslipidemia: On Lipitor.  11. Transaminitis  Cont to monitor LFTs 12. Diarrhea  -none since Friday morning   LOS (Days) 6 A FACE TO FACE EVALUATION WAS  PERFORMED  SWARTZ,ZACHARY T 10/10/2016 8:19 AM

## 2016-10-11 ENCOUNTER — Encounter (HOSPITAL_COMMUNITY): Payer: BC Managed Care – PPO | Admitting: Psychology

## 2016-10-11 ENCOUNTER — Inpatient Hospital Stay (HOSPITAL_COMMUNITY): Payer: BC Managed Care – PPO | Admitting: Physical Therapy

## 2016-10-11 ENCOUNTER — Inpatient Hospital Stay (HOSPITAL_COMMUNITY): Payer: BC Managed Care – PPO | Admitting: Occupational Therapy

## 2016-10-11 DIAGNOSIS — E781 Pure hyperglyceridemia: Secondary | ICD-10-CM

## 2016-10-11 LAB — CBC
HCT: 36.8 % (ref 36.0–46.0)
Hemoglobin: 12.7 g/dL (ref 12.0–15.0)
MCH: 31 pg (ref 26.0–34.0)
MCHC: 34.5 g/dL (ref 30.0–36.0)
MCV: 89.8 fL (ref 78.0–100.0)
PLATELETS: 238 10*3/uL (ref 150–400)
RBC: 4.1 MIL/uL (ref 3.87–5.11)
RDW: 12.7 % (ref 11.5–15.5)
WBC: 6.6 10*3/uL (ref 4.0–10.5)

## 2016-10-11 LAB — GLUCOSE, CAPILLARY
GLUCOSE-CAPILLARY: 110 mg/dL — AB (ref 65–99)
GLUCOSE-CAPILLARY: 97 mg/dL (ref 65–99)
Glucose-Capillary: 151 mg/dL — ABNORMAL HIGH (ref 65–99)
Glucose-Capillary: 166 mg/dL — ABNORMAL HIGH (ref 65–99)

## 2016-10-11 LAB — BASIC METABOLIC PANEL
ANION GAP: 9 (ref 5–15)
BUN: 12 mg/dL (ref 6–20)
CALCIUM: 8.9 mg/dL (ref 8.9–10.3)
CO2: 24 mmol/L (ref 22–32)
Chloride: 109 mmol/L (ref 101–111)
Creatinine, Ser: 0.78 mg/dL (ref 0.44–1.00)
GFR calc Af Amer: >60 mL/min (ref >60)
GLUCOSE: 115 mg/dL — AB (ref 65–99)
Potassium: 3.7 mmol/L (ref 3.5–5.1)
Sodium: 142 mmol/L (ref 135–145)

## 2016-10-11 MED ORDER — ATORVASTATIN CALCIUM 80 MG PO TABS: 80.0000 mg | ORAL_TABLET | Freq: Every day | ORAL | 0 refills | Status: DC

## 2016-10-11 MED ORDER — METFORMIN HCL 1000 MG PO TABS: 500.0000 mg | ORAL_TABLET | Freq: Two times a day (BID) | ORAL | Status: DC

## 2016-10-11 MED ORDER — CLOPIDOGREL BISULFATE 75 MG PO TABS: 75.0000 mg | ORAL_TABLET | Freq: Every day | ORAL | 0 refills | Status: AC

## 2016-10-11 NOTE — Progress Notes (Signed)
[place] PHYSICAL MEDICINE & REHABILITATION     PROGRESS NOTE  Subjective/Complaints:  Pt seen sitting at the edge of the bed, working with therapies.  She would like to go home.    ROS: Denies CP, SOB, N/V/D.  Objective: Vital Signs: Blood pressure 129/63, pulse 73, temperature 98.6 F (37 C), temperature source Oral, resp. rate 18, height 5\' 3"  (1.6 m), weight 77.6 kg (171 lb 1 oz), last menstrual period 04/18/2013, SpO2 99 %. No results found.  Recent Labs  10/11/16 0446  WBC 6.6  HGB 12.7  HCT 36.8  PLT 238    Recent Labs  10/11/16 0446  NA 142  K 3.7  CL 109  GLUCOSE 115*  BUN 12  CREATININE 0.78  CALCIUM 8.9   CBG (last 3)   Recent Labs  10/10/16 1630 10/10/16 2110 10/11/16 0600  GLUCAP 129* 136* 110*    Wt Readings from Last 3 Encounters:  10/04/16 77.6 kg (171 lb 1 oz)  10/01/16 78.4 kg (172 lb 12.8 oz)  08/30/16 81.2 kg (179 lb)    Physical Exam:  BP 129/63 (BP Location: Right Arm)   Pulse 73   Temp 98.6 F (37 C) (Oral)   Resp 18   Ht 5\' 3"  (1.6 m)   Wt 77.6 kg (171 lb 1 oz)   LMP 04/18/2013   SpO2 99%   BMI 30.30 kg/m  Constitutional: She appears well-developed and well-nourished. NAD. HENT: Normocephalic and atraumatic.  Eyes: EOMI. No discharge.  Cardiovascular: RRR no JVD. Respiratory: CTA B with normal effort GI: Soft. Bowel sounds are normal.  Musculoskeletal: She exhibits no edema or tenderness.  Neurological: She is alert and oriented.  Right facial weakness with mild dysarthria Sensation intact to light touch throughout. Able to follow basic commands without difficulty.  Motor: LUE/LLE 5/5 proximal to distal  RUE: 3+/5 proximal to distal  (improving) RLE: 4-4+/5 proximal to distal   Skin: Skin is warm and dry.  Psychiatric: She has a normal mood and affect. Her behavior is normal. Judgment and thought content normal   Assessment/Plan: 1. Functional deficits secondary to acute pontine infact which require 3+ hours  per day of interdisciplinary therapy in a comprehensive inpatient rehab setting. Physiatrist is providing close team supervision and 24 hour management of active medical problems listed below. Physiatrist and rehab team continue to assess barriers to discharge/monitor patient progress toward functional and medical goals.  Function:  Bathing Bathing position   Position: Shower  Bathing parts Body parts bathed by patient: Right arm, Chest, Abdomen, Front perineal area, Buttocks, Right upper leg, Left upper leg, Right lower leg, Left lower leg, Left arm Body parts bathed by helper: Back  Bathing assist        Upper Body Dressing/Undressing Upper body dressing   What is the patient wearing?: Pull over shirt/dress     Pull over shirt/dress - Perfomed by patient: Thread/unthread right sleeve, Thread/unthread left sleeve, Put head through opening, Pull shirt over trunk          Upper body assist Assist Level: Set up      Lower Body Dressing/Undressing Lower body dressing   What is the patient wearing?: Pants, Underwear, Socks, Shoes Underwear - Performed by patient: Thread/unthread right underwear leg, Thread/unthread left underwear leg, Pull underwear up/down   Pants- Performed by patient: Thread/unthread right pants leg, Thread/unthread left pants leg, Pull pants up/down       Socks - Performed by patient: Don/doff right sock, Don/doff left sock  Shoes - Performed by patient: Don/doff right shoe, Don/doff left shoe            Lower body assist Assist for lower body dressing: Touching or steadying assistance (Pt > 75%)      Toileting Toileting   Toileting steps completed by patient: Adjust clothing prior to toileting, Performs perineal hygiene, Adjust clothing after toileting Toileting steps completed by helper: Performs perineal hygiene (per Jobelle Mesias, NT report) Toileting Assistive Devices: Grab bar or rail  Toileting assist Assist level: Supervision or verbal  cues   Transfers Chair/bed transfer   Chair/bed transfer method: Ambulatory Chair/bed transfer assist level: Supervision or verbal cues Chair/bed transfer assistive device: Armrests     Locomotion Ambulation     Max distance: 600 Assist level: Touching or steadying assistance (Pt > 75%)   Wheelchair   Type: Manual Max wheelchair distance: 30 ft Assist Level: Touching or steadying assistance (Pt > 75%)  Cognition Comprehension Comprehension assist level: Follows complex conversation/direction with no assist  Expression Expression assist level: Expresses complex ideas: With no assist  Social Interaction Social Interaction assist level: Interacts appropriately with others - No medications needed.  Problem Solving Problem solving assist level: Solves complex problems: Recognizes & self-corrects  Memory Memory assist level: Complete Independence: No helper    Medical Problem List and Plan: 1.  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   Cont CIR  2.  DVT Prophylaxis/Anticoagulation: Pharmaceutical: Lovenox 3. Pain Management: N/A 4. Mood: LCSW to follow for evaluation and support.  5. Neuropsych: This patient is capable of making decisions on her own behalf. 6. Skin/Wound Care: routine pressure relief mesures 7. Fluids/Electrolytes/Nutrition: Monitor I/Os  BMP within acceptable ragen on 5/14 8. 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   Controlled 5/14 9. T2DM: Poorly controlled with Hgb A1c  Lantus increased to 40U on 5/9, increased to 48U on 5/11 with meal coverage.   Metformin at 500 BID, pt cannot tolerate higher dose  Controlled 5/14 10. Dyslipidemia: On Lipitor.  11. Transaminitis  Cont to monitor LFTs 12. Diarrhea  Resolved   LOS (Days) 7 A FACE TO FACE EVALUATION WAS PERFORMED  Raiford Fetterman Lorie Phenix 10/11/2016 8:43 AM

## 2016-10-11 NOTE — Progress Notes (Signed)
Occupational Therapy Discharge Summary  Patient Details  Name: Shelley Frazier MRN: [id number] Date of Birth: [birth year]  Today's Date: 10/11/2016 OT Individual Time: 1447-1530 OT Individual Time Calculation (min): 43 min    Session Note:  Pt ambulated down to the therapy gym with use of the single point cane and modified independence.  Noted pt with dragging of right toe on two occasions but no LOB happened.  Once in the therapy gym she utilized the UE ergonometer for 2 sets of 5 mins with isolated use of the RUE.  Therapist provided ace wrapping for the right hand secondary to increased weakness.   RPMs maintained at around 9.  Once finished had pt sit on therapy mat and work on [employer] task of sorting cards with the RUE.  Min facilitation to avoid shoulder hike during reaching.  Educated pt on [place] coordination handout as well.  Ambulated back to the room at end of session with spouse present.    Patient has met 11 of 11 long term goals due to improved balance, postural control, ability to compensate for deficits, functional use of  RIGHT upper and RIGHT lower extremity and improved coordination.  Patient to discharge at overall Modified Independent level.  Patient's care partner is independent to provide the necessary physical assistance at discharge.    Reasons goals not met: NA  Recommendation:  Patient will benefit from ongoing skilled OT services in outpatient setting to continue to advance functional skills in the area of BADL, iADL, Vocation and Reduce care partner burden.  Pt still demonstrates mild right UE and LE hemiparesis, using only the RUE at a diminished level with increased time.  Feel she will benefit from outpatient OT services to continue progression with RUE functional use, strength, and coordination.    Equipment: 3:1  Reasons for discharge: treatment goals met and discharge from hospital  Patient/family agrees with progress made and goals achieved: Yes  OT  Discharge Precautions/Restrictions  Precautions Precautions: Fall Precaution Comments: R hemi Restrictions Weight Bearing Restrictions: No   Pain Pain Assessment Pain Assessment: No/denies pain ADL   See Function section of chart for details Vision Patient Visual Report: No change from baseline Vision Assessment?: No apparent visual deficits Eye Alignment: Within Functional Limits Ocular Range of Motion: Within Functional Limits Tracking/Visual Pursuits: Able to track stimulus in all quads without difficulty Perception  Perception: Within Functional Limits Praxis Praxis: Intact Cognition Overall Cognitive Status: Within Functional Limits for tasks assessed Arousal/Alertness: Awake/alert Orientation Level: Oriented X4 Attention: Selective Focused Attention: Appears intact Sustained Attention: Appears intact Memory: Appears intact Awareness: Appears intact Problem Solving: Appears intact Reasoning: Appears intact Self Monitoring: Appears intact Self Correcting: Appears intact Safety/Judgment: Appears intact Sensation Sensation Light Touch: Appears Intact Stereognosis: Appears Intact Hot/Cold: Appears Intact Proprioception: Appears Intact Coordination Gross Motor Movements are Fluid and Coordinated: No Fine Motor Movements are Fluid and Coordinated: No Motor  Motor Motor: Hemiplegia Motor - Discharge Observations: decreased strength/coordination through Rt UE/LE.  Mobility  Transfers Transfers: Sit to Stand;Stand to Sit Sit to Stand: 6: Modified independent (Device/Increase time);With armrests;With upper extremity assist;From toilet Stand to Sit: 6: Modified independent (Device/Increase time);With upper extremity assist;With armrests;To toilet  Trunk/Postural Assessment  Cervical Assessment Cervical Assessment: Exceptions to [facility] (left head tilt with use of the RUE) Thoracic Assessment Thoracic Assessment: Within Functional Limits Lumbar Assessment Lumbar  Assessment: Within Functional Limits Postural Control Postural Control:  (maintains posterior pelvic tilt in sitting)  Balance Balance Balance Assessed: Yes Static Sitting Balance Static  Sitting - Balance Support: No upper extremity supported Static Sitting - Level of Assistance: 7: Independent Dynamic Sitting Balance Dynamic Sitting - Balance Support: During functional activity Dynamic Sitting - Level of Assistance: 7: Independent Static Standing Balance Static Standing - Balance Support: No upper extremity supported;During functional activity Static Standing - Level of Assistance: 7: Independent Dynamic Standing Balance Dynamic Standing - Balance Support: Left upper extremity supported;During functional activity Dynamic Standing - Level of Assistance: 6: Modified independent (Device/Increase time) Extremity/Trunk Assessment RUE Assessment RUE Assessment: Exceptions to [facility] RUE Strength RUE Overall Strength Comments: Brunnstrum stage V in the right shoulder and stage VI in the hand.  Slight synergy pattern still present with shoulder flexion greater than 90 degrees.  Isolated movement in all joints with ability to oppose thumb to digits 2-4 but not quite 5.  Decreased overall strength throughout as well as decreased ability to complete in-hand manipulation.   LUE Assessment LUE Assessment: Within Functional Limits   See Function Navigator for Current Functional Status.  Jazsmin Couse OTR/L 10/11/2016, 4:40 PM

## 2016-10-11 NOTE — Progress Notes (Signed)
Occupational Therapy Session Note  Patient Details  Name: Shelley Frazier MRN: [id number] Date of Birth: 03/21/1966  Today's Date: 10/11/2016 OT Individual Time: 1003-1058 OT Individual Time Calculation (min): 55 min    Short Term Goals: Week 1:  OT Short Term Goal 1 (Week 1): STGs equal to LTGs set at overall modified independent level.  Skilled Therapeutic Interventions/Progress Updates:    Pt completed functional mobility with use of the single point cane to the therapy gym with modified independence.  She was able to complete weightbearing tasks in quadriped with emphasis on maintaining weightbearing over the RUE with elbow extension, while reaching forward with the left.  Also completed one set of 5 repetitions for modified pushup.  Transitioned to sitting with bilateral shoulder flexion for 2 sets of 10 reps while holding a ball.  Slight shoulder hike and left head tilt noted, requiring min assist to correct.  Educated pt on performance of this as well with cane at home.  Worked on functional reach to pick up checkers with the RUE.  She was able to pick them up and place them with supervision using the RUE one at a time, still with shoulder compensation.  Worked on picking them up one at a time and then manipulating in hand from palm to fingertips to place.  Moderate difficulty with palm to fingertips but she was able to transition from fingertips to palm much more easily.  Ambulated back to room to complete session.    Therapy Documentation Precautions:  Precautions Precautions: Fall Precaution Comments: R hemi Restrictions Weight Bearing Restrictions: No   Pain: Pain Assessment Pain Assessment: No/denies pain Pain Score: 0-No pain ADL:  See Function Navigator for Current Functional Status.   Therapy/Group: Individual Therapy  Jaedah Lords OTR/L 10/11/2016, 12:23 PM

## 2016-10-11 NOTE — Progress Notes (Signed)
Physical Therapy Discharge Summary  Patient Details  Name: Shelley Frazier MRN: [id number] Date of Birth: 10-23-1965  Today's Date: 10/11/2016 PT Individual Time: 0800-0859 PT Individual Time Calculation (min): 59 min    Patient has met 8 of 9 long term goals due to improved activity tolerance, improved balance, increased strength, ability to compensate for deficits, functional use of  right upper extremity and right lower extremity and improved coordination.  Patient to discharge at an ambulatory level Supervision.   Patient's care partner is independent to provide the necessary physical assistance at discharge.  Pt did not meet w/c goal as this was not a clinical focus due to rapid progression with ambulation.   Treatment: Session 1: Pt in bed upon arrival and agreeable to PT session. Pt reports being excited about going home tomorrow. Session focused on functional activities including ambulation with SPC, basic/car transfers, stairs, and bed mobility. Pt ambulating 200 ft with SPC, occasional mild instability but able to catch balance without assistance. Reinforced supervision with ambulation at home. Pt ambulating in halls and in home simulated environment. Reviewed fall precautions with pt as well. Transfers performed from bed, mat table, couch, standard bed, and recliner. Cues for weightbearing through Rt LE. Upon completion of session, pt returned to room, sitting EOB per request with all needs in reach.  Session 2:  Transfers: Mod I sit<>stand using SPC, good safety awareness. Ambulation: 100 ft X1, 120 ft X1, 200 ft X1 using SPC and supervision due to occasional instability. Exercise: establishing HEP for strength/coordination/balance. Standing hip abduction, extension, marching, knee flexion - each 1 X10. Performed at sink for balance stability. Sit<>stand 1X10 and Heel-toe static standing (at sink and UE support as needed). Pt performed each exercise and handout provided. Pt denied any  questions or concerns and emphasis placed on safety. Discussed safety at home as well. Following session, pt returned to room. Sitting in chair with husband present and all needs in reach. Patient denies any questions or concerns.    Recommendation:  Patient will benefit from ongoing skilled PT services in outpatient setting to continue to advance safe functional mobility, address ongoing impairments in balance, coordination, gait, strength, and general functional mobility to minimize fall risk.  Equipment: single point cane  Reasons for discharge: treatment goals met  Patient/family agrees with progress made and goals achieved: Yes  PT Discharge Precautions/Restrictions Precautions Precautions: Fall Precaution Comments: R hemi Restrictions Weight Bearing Restrictions: No   Pain Pain Assessment Pain Assessment: No/denies pain Pain Score: 0-No pain  Intermittent lateral hip pain in second session, monitored.  Vision/Perception  Vision - History Patient Visual Report: Other (comment) (worse close vision, improved distance vision) Vision - Assessment Eye Alignment: Within Functional Limits Perception Perception: Within Functional Limits  Cognition Overall Cognitive Status: Within Functional Limits for tasks assessed Arousal/Alertness: Awake/alert Orientation Level: Oriented X4 Attention: Selective Focused Attention: Appears intact Sustained Attention: Appears intact Memory: Appears intact Awareness: Appears intact Problem Solving: Appears intact Reasoning: Appears intact Self Monitoring: Appears intact Self Correcting: Appears intact Safety/Judgment: Appears intact Sensation Sensation Light Touch: Appears Intact Proprioception: Appears Intact Coordination Gross Motor Movements are Fluid and Coordinated: No Fine Motor Movements are Fluid and Coordinated: No Coordination and Movement Description: delayed speed with alternating motions Heel Shin Test: mild  overshooting, limited range, decreased speed.  Motor  Motor Motor: Hemiplegia Motor - Skilled Clinical Observations: decreased strength/coordination through Rt UE/LE.  Motor - Discharge Observations: decreased strength/coordination through Rt UE/LE.      Balance Static Sitting  Balance Static Sitting - Balance Support: No upper extremity supported Static Sitting - Level of Assistance: 7: Independent Dynamic Sitting Balance Dynamic Sitting - Balance Support: No upper extremity supported Dynamic Sitting - Level of Assistance: 7: Independent Static Standing Balance Static Standing - Balance Support: No upper extremity supported Static Standing - Level of Assistance: 7: Independent Dynamic Standing Balance Dynamic Standing - Balance Support: No upper extremity supported Dynamic Standing - Level of Assistance: 6: Modified independent (Device/Increase time) Extremity Assessment  RUE Strength RUE Overall Strength Comments: active shoulder flexion above 90 degrees, full active elbow and wrist flexion/extension, active grip.  LUE Assessment LUE Assessment: Within Functional Limits RLE Strength RLE Overall Strength Comments: hip flexion 4-/5, knee extension 4-/5, knee flexion 4-/5, dorsiflexion 4-/5, plantarflexion 4-/5 LLE Assessment LLE Assessment: Within Functional Limits   See Function Navigator for Current Functional Status.  Linard Millers, PT 10/11/2016, 12:39 PM

## 2016-10-11 NOTE — Progress Notes (Signed)
Inpatient Diabetes Program Recommendations  AACE/ADA: New Consensus Statement on Inpatient Glycemic Control (2015)  Target Ranges:  Prepandial:   less than 140 mg/dL      Peak postprandial:   less than 180 mg/dL (1-2 hours)      Critically ill patients:  140 - 180 mg/dL   Lab Results  Component Value Date   GLUCAP 97 10/11/2016   HGBA1C 13.6 (H) 10/01/2016    Review of Glycemic Control  Inpatient Diabetes Program Recommendations:  Met with patient and husband @ bedside. Reviewed insulin pen and answered questions.Patient and husband feel comfortable with insulin pen administration and has a $0 copay card for Lantus. No further questions @ this time.  Thank you, Nani Gasser. Noga Fogg, RN, MSN, CDE  Diabetes Coordinator Inpatient Glycemic Control Team Team Pager [phone] (8am-5pm) 10/11/2016 3:17 PM

## 2016-10-11 NOTE — Progress Notes (Signed)
Social Work  Discharge Note  The overall goal for the admission was met for:   Discharge location: Yes-HOME WITH HUSBAND WHO IS TAKING A FMLA TO PROVIDE SUPERVISION TO PT  Length of Stay: Yes-8 DAYS  Discharge activity level: Yes-SUPERVISION LEVEL  Home/community participation: Yes  Services provided included: MD, RD, PT, OT, SLP, RN, CM, TR, Pharmacy, Neuropsych and SW  Financial Services: Private Insurance: [employer]  Follow-up services arranged: Outpatient: [facility]-PT & OT 5/17 10:15-12:00, DME: [facility] and Patient/Family has no preference for HH/DME agencies  Comments (or additional information):PT DID WELL AND HUSBAND HAS BEEN HERE FOR EDUCATION AND IS TAKING A FMLA TO BE THERE WITH PT. PAPERWORK GIVEN TO PT AND HUSBAND AFTER FAXED IN.  Patient/Family verbalized understanding of follow-up arrangements: Yes  Individual responsible for coordination of the follow-up plan: SELF & LEE-HUSBAND  Confirmed correct DME delivered: Elease Hashimoto 10/11/2016    Elease Hashimoto

## 2016-10-11 NOTE — Discharge Summary (Signed)
Physician Discharge Summary  Patient ID: BIVIANA SADDLER MRN: [id number] DOB/AGE: 1966-03-27 51 y.o.  Admit date: 10/04/2016 Discharge date: 10/12/2016  Discharge Diagnoses:  Principal Problem:   Left pontine stroke [facility]) Active Problems:   Right hemiparesis (HCC)   Dysarthria, post-stroke   Poorly controlled diabetes mellitus (HCC)   Dyslipidemia   Benign essential HTN   Transaminitis   Hypertriglyceridemia   Hypoglycemia   Discharged Condition: stable  Significant Diagnostic Studies: N/A   Labs:  Basic Metabolic Panel:  Recent Labs Lab 10/11/16 0446  NA 142  K 3.7  CL 109  CO2 24  GLUCOSE 115*  BUN 12  CREATININE 0.78  CALCIUM 8.9    Hepatic Function Latest Ref Rng & Units 10/05/2016 10/01/2016 07/30/2013  Total Protein 6.5 - 8.1 g/dL 7.1 7.3 7.2  Albumin 3.5 - 5.0 g/dL 3.7 3.8 4.1  AST 15 - 41 U/L 40 27 22  ALT 14 - 54 U/L 59(H) 43 46(H)  Alk Phosphatase 38 - 126 U/L 71 79 111  Total Bilirubin 0.3 - 1.2 mg/dL 0.9 1.0 0.6    CBC:  Recent Labs Lab 10/11/16 0446  WBC 6.6  HGB 12.7  HCT 36.8  MCV 89.8  PLT 238    Lipid Panel     Component Value Date/Time   CHOL 267 (H) 10/02/2016 1234   TRIG 660 (H) 10/02/2016 1234   HDL 25 (L) 10/02/2016 1234   CHOLHDL 10.7 10/02/2016 1234   VLDL UNABLE TO CALCULATE IF TRIGLYCERIDE OVER 400 mg/dL 10/02/2016 1234   LDLCALC UNABLE TO CALCULATE IF TRIGLYCERIDE OVER 400 mg/dL 10/02/2016 1234    CBG:  Recent Labs Lab 10/11/16 1625 10/11/16 2103 10/12/16 0617 10/12/16 0652 10/12/16 1112  GLUCAP 151* 166* 49* 124* 132*    Brief HPI:   Ciin C Presnellis a 51 y.o.right hand femalewith history of HTN, T2DM, fatty liver who was admitted to [facility] on 10/01/16 with history of right sided weakness and slurred speech that started the night before. MRI brain done revealing "Acute nonhemorrhagic LEFT pontine brainstem infarct. 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 Dr. Erlinda Hong recommended DAPT for 3 months followed by plavix alone. Patient with resultant right sided weakness, mild dysarthria,  with dizziness and balance deficits. CIR recommended due to significant deficits in mobility and ADL tasks.    Hospital Course: LILIANN FILE was admitted to rehab 10/04/2016 for inpatient therapies to consist of PT and OT at least three hours five days a week. Past admission physiatrist, therapy team and rehab RN have worked together to provide customized collaborative inpatient rehab. Blood pressures have been monitored on bid basis with SBP goal 130-150 and have been reasonably controlled. Metformin was resumed at 500 mg bid and she has tolerated this without GI side effects. Diabetes has been monitored on ac/hs basis and Lantus was titrated upwards for tighter control.  BS are showing great improvement and SSI with meal coverage were discontinued at discharge and this should prevent recent hypoglycemic episodes. She has been educated on CM diet as well as insulin management and administration. Mood has been stable and Dr. Sima Matas was consulted to help with coping due to adjustment issues. She is has had improvement in RUE and RLE strength and has progressed to supervision level. She will continue to receive follow up outpatient PT and OT at [facility] after discharge.  Rehab course: During patient's stay in rehab weekly team conferences were held to monitor patient's progress, set goals and discuss barriers to discharge. At admission, patient required min assist with ADL tasks and min assist with mobility. Cognitive testing revealed MoCA score at 100% accuracy and she exhibited mild to moderate dysarthria. She has had improvement in activity tolerance, balance, postural control, as well as ability to compensate for deficits. She is has had  improvement in functional use RUE and RLE as well as improved awareness. She is able to complete ADL tasks at modified independent level. She is modified independent for transfers. She is ambulating 200' with SPC and occasional instability noted but she is able to self correct. She is able to utilize compensatory speech stragies independently and speech therapy signed off on 5/11. Husband has been very supportive and has been educated on all aspects of care.    Disposition: 01-Home or Self Care  Diet: Heart healthy/Carb modified.   Special Instructions: 1. Blood pressure goal 130-150 due to basilar artery stenosis. 2. Monitor BS ac/hs and follow up with primary for titration as needed.   Discharge Instructions    Ambulatory referral to Physical Medicine Rehab    Complete by:  As directed    1-2 weeks transitional care appt     Allergies as of 10/12/2016      Reactions   Metformin And Related    Unable to tolerate dose above 500 mg bid   Vicodin [hydrocodone-acetaminophen] Nausea And Vomiting      Medication List    STOP taking these medications   glimepiride 2 MG tablet Commonly known as:  AMARYL   insulin aspart 100 UNIT/ML injection Commonly known as:  novoLOG   insulin glargine 100 UNIT/ML injection Commonly known as:  LANTUS Replaced by:  Insulin Glargine 100 UNIT/ML Solostar Pen   lisinopril 2.5 MG tablet Commonly known as:  PRINIVIL,ZESTRIL     TAKE these medications   aspirin 325 MG tablet Take 1 tablet (325 mg total) by mouth daily.   atorvastatin 80 MG tablet Commonly known as:  LIPITOR Take 1 tablet (80 mg total) by mouth daily at 6 PM.   clopidogrel 75 MG tablet Commonly known as:  PLAVIX Take 1 tablet (75 mg total) by mouth daily.   COD LIVER OIL PO Take by mouth daily.   Insulin Glargine 100 UNIT/ML Solostar Pen Commonly known as:  LANTUS Inject 48 Units into the skin daily at 10 pm. Replaces:  insulin glargine 100 UNIT/ML injection   metFORMIN  500 MG tablet Commonly known as:  GLUCOPHAGE Take 1 tablet (500 mg total) by mouth 2 (two) times daily with a meal. What changed:  medication strength  how much to take   multivitamin tablet Take 1 tablet by mouth daily.   Pen Needles 31G X 6 MM Misc 1 application by Does not apply route at bedtime.      Follow-up Information    Jamse Arn, MD Follow up.   Specialty:  Physical Medicine and Rehabilitation Contact information: [place] [place] [address] [phone]        Rosalin Hawking, MD Follow up.   Specialty:  Neurology Contact information: [address] [place] [address] [phone]        Jani Gravel, MD Follow up on 10/16/2016.   Specialty:  Internal Medicine Why:  Appointment @ 9:40 AM Contact information: [address] [address] [phone]  SignedBary Leriche 10/12/2016, 6:09 PM

## 2016-10-11 NOTE — Progress Notes (Signed)
Social Work Patient ID: Shelley Frazier, female   DOB: [birth year], 51 y.o.   MRN: [id number]  Met with pt to discuss discharge tomorrow. She feels ready OP set up via [facility] and equipment ordered to be delivered to room Prior to discharge tomorrow. Pt is prepared to change her habits and take better care of herself. Neuro-psych saw here today for coping and she thought it was helpful.

## 2016-10-11 NOTE — Plan of Care (Signed)
Problem: RH BOWEL ELIMINATION Goal: RH OTHER STG BOWEL ELIMINATION GOALS W/ASSIST Other STG Bowel Elimination Goals With Assistance.  Outcome: Completed/Met Date Met: 10/11/16 Min assist  Problem: RH BLADDER ELIMINATION Goal: RH OTHER STG BLADDER ELIMINATION GOALS W/ASSIST Other STG Bladder Elimination Goals With Assistance  Outcome: Completed/Met Date Met: 10/11/16 Min assist  Problem: RH SKIN INTEGRITY Goal: RH OTHER STG SKIN INTEGRITY GOALS W/ASSIST Other STG Skin Integrity Goals With Assistance.  Outcome: Completed/Met Date Met: 10/11/16 Min assist  Problem: RH PAIN MANAGEMENT Goal: RH STG PAIN MANAGED AT OR BELOW PT'S PAIN GOAL Outcome: Completed/Met Date Met: 10/11/16 Pain less than 2 Goal: RH OTHER STG PAIN MANAGEMENT GOALS W/ASSIST Other STG Pain Management Goals With Assistance.  Outcome: Completed/Met Date Met: 10/11/16 Min assist

## 2016-10-11 NOTE — Consult Note (Signed)
Neuropsychological Consultation   Patient:   Shelley Frazier   DOB:   March 22, 1966  MR Number:  [id number]  Location:  [place] [address] [facility] [address] [id number] [place] [address] Dept: [place]: [phone]           Date of Service:   10/11/2016  Start Time:   11 Am End Time:   11:57 AM  Provider/Observer:  Ilean Skill, Psy.D.       Clinical Neuropsychologist       Billing Code/Service: [phone] 4 Units  Chief Complaint:    The patient had stroke on 5.4.2018.  She has reported more emotional responses and crying.  Referred for neuropsych to address these adjustment issue, plan for discharge, and assess if mood changes were neurological in nature or just adjustment responses.  Reason for Service:  Shelley Frazier a 51 yo female with history of hyper tension and type 2 diabetes.  Admitted to [facility] on 5.4.2018 with right side weakness and slured speech.  MRI:  Acute left pontine brainstem infarct.  The patient also has had increased emotional response with crying.      Current Status:  The patient reports that she has done a little better over the past day or so and is really ready to go home.  She reports that she is going to changes a lot of her self care and make sure she is taking meds correction.  Behavioral Observation: Shelley Frazier  presents as a 51 y.o.-year-old Right Caucasian Female who appeared her stated age. her dress was Appropriate and she was Well Groomed and her manners were Appropriate to the situation.  her participation was indicative of Appropriate and Attentive behaviors.  There were physical disabilities noted.  she displayed an appropriate level of cooperation and motivation.     Interactions:    Active Appropriate and Attentive  Attention:   within normal limits and attention span and concentration were age appropriate  Memory:   within normal limits; recent  and remote memory intact  Visuo-spatial:  within normal limits  Speech (Volume):  normal  Speech:   normal;   Thought Process:  Coherent and Relevant  Though Content:  WNL;   Orientation:   person, place, time/date and situation  Judgment:   Good  Planning:   Good  Affect:    Anxious, Depressed and Tearful  Mood:    Anxious and Depressed  Insight:   Good  Intelligence:   normal  Medical History:   Past Medical History:  Diagnosis Date  . Back pain   . Diabetes mellitus without complication ([place])   . Fatty liver   . Gallstones   . Hot flashes 07/30/2013  . Hypertension   . Obesity   . Perimenopausal symptoms 07/30/2013           Family Med/Psych History:  Family History  Problem Relation Age of Onset  . Hypertension Mother   . Gallbladder disease Mother   . Diabetes Father   . Hypertension Father   . Gallbladder disease Maternal Aunt   . Heart disease Paternal Aunt   . Heart disease Paternal Uncle   . Diabetes Maternal Grandfather   . Heart disease Paternal Grandmother   . Diabetes Paternal Grandmother     Risk of Suicide/Violence: low Patient denies SI or HI  Impression/DX:  Shelley Frazier is a 51 yo female that had history of T2D and high blood pressure.  She has had more emotional responses and adjustment issues.    Diagnosis:    Mood disorder due to general medical condition.        Electronically Signed   _______________________ Ilean Skill, Psy.D.

## 2016-10-12 DIAGNOSIS — E162 Hypoglycemia, unspecified: Secondary | ICD-10-CM

## 2016-10-12 LAB — GLUCOSE, CAPILLARY
GLUCOSE-CAPILLARY: 124 mg/dL — AB (ref 65–99)
Glucose-Capillary: 49 mg/dL — ABNORMAL LOW (ref 65–99)
Glucose-Capillary: 132 mg/dL — ABNORMAL HIGH (ref 65–99)

## 2016-10-12 MED ORDER — INSULIN GLARGINE 100 UNIT/ML SOLOSTAR PEN: 48.0000 [IU] | PEN_INJECTOR | Freq: Every day | SUBCUTANEOUS | 0 refills | Status: DC

## 2016-10-12 MED ORDER — ATORVASTATIN CALCIUM 80 MG PO TABS: 80.0000 mg | ORAL_TABLET | Freq: Every day | ORAL | 0 refills | Status: AC

## 2016-10-12 MED ORDER — PEN NEEDLES 31G X 6 MM MISC: 1.0000 "application " | Freq: Every day | 0 refills | Status: DC

## 2016-10-12 MED ORDER — METFORMIN HCL 500 MG PO TABS: 500.0000 mg | ORAL_TABLET | Freq: Two times a day (BID) | ORAL | 0 refills | Status: DC

## 2016-10-12 NOTE — Final Progress Note (Signed)
Patient discharged to home accompanied by her husband. 

## 2016-10-12 NOTE — Progress Notes (Signed)
[place] [facility]IC[facility] & REHABILITATION     PROGRESS NOTE  Subjective/Complaints:  Pt seen sitting up at the edge of the bed.  She is anxious to go home.   ROS: Denies CP, SOB, N/V/D.  Objective: Vital Signs: Blood pressure (!) 144/63, pulse 61, temperature 98 F (36.7 C), temperature source Oral, resp. rate 18, height 5\' 3"  (1.6 m), weight 79 kg (174 lb 2.6 oz), last menstrual period 04/18/2013, SpO2 100 %. No results found.  Recent Labs  10/11/16 0446  WBC 6.6  HGB 12.7  HCT 36.8  PLT 238    Recent Labs  10/11/16 0446  NA 142  K 3.7  CL 109  GLUCOSE 115*  BUN 12  CREATININE 0.78  CALCIUM 8.9   CBG (last 3)   Recent Labs  10/11/16 2103 10/12/16 0617 10/12/16 0652  GLUCAP 166* 49* 124*    Wt Readings from Last 3 Encounters:  10/12/16 79 kg (174 lb 2.6 oz)  10/01/16 78.4 kg (172 lb 12.8 oz)  08/30/16 81.2 kg (179 lb)    Physical Exam:  BP (!) 144/63 (BP Location: Right Arm)   Pulse 61   Temp 98 F (36.7 C) (Oral)   Resp 18   Ht 5\' 3"  (1.6 m)   Wt 79 kg (174 lb 2.6 oz)   LMP 04/18/2013   SpO2 100%   BMI 30.85 kg/m  Constitutional: She appears well-developed and well-nourished. NAD. HENT: Normocephalic and atraumatic.  Eyes: EOMI. No discharge.  Cardiovascular: RRR no JVD. Respiratory: CTA B with normal effort GI: Soft. Bowel sounds are normal.  Musculoskeletal: She exhibits no edema or tenderness.  Neurological: She is alert and oriented.  Right facial weakness with mild dysarthria Sensation intact to light touch throughout. Able to follow basic commands without difficulty.  Motor: LUE/LLE 5/5 proximal to distal  RUE: 3+/5 proximal to distal  (improving) RLE: 4-4+/5 proximal to distal (improving)  Skin: Skin is warm and dry.  Psychiatric: She has a normal mood and affect. Her behavior is normal. Judgment and thought content normal   Assessment/Plan: 1. Functional deficits secondary to acute pontine infact which require 3+ hours per  day of interdisciplinary therapy in a comprehensive inpatient rehab setting. Physiatrist is providing close team supervision and 24 hour management of active medical problems listed below. Physiatrist and rehab team continue to assess barriers to discharge/monitor patient progress toward functional and medical goals.  Function:  Bathing Bathing position   Position: Shower  Bathing parts Body parts bathed by patient: Right arm, Left arm, Chest, Abdomen, Front perineal area, Buttocks, Right upper leg, Left upper leg, Right lower leg, Left lower leg, Back Body parts bathed by helper: Back  Bathing assist Assist Level: More than reasonable time      Upper Body Dressing/Undressing Upper body dressing   What is the patient wearing?: Pull over shirt/dress     Pull over shirt/dress - Perfomed by patient: Thread/unthread right sleeve, Thread/unthread left sleeve, Put head through opening, Pull shirt over trunk          Upper body assist Assist Level: More than reasonable time      Lower Body Dressing/Undressing Lower body dressing   What is the patient wearing?: Pants, Underwear, Socks, Shoes Underwear - Performed by patient: Thread/unthread right underwear leg, Thread/unthread left underwear leg, Pull underwear up/down   Pants- Performed by patient: Thread/unthread right pants leg, Thread/unthread left pants leg, Pull pants up/down       Socks - Performed by patient:  Don/doff right sock, Don/doff left sock   Shoes - Performed by patient: Don/doff right shoe, Don/doff left shoe            Lower body assist Assist for lower body dressing: More than reasonable time      Toileting Toileting   Toileting steps completed by patient: Adjust clothing prior to toileting, Performs perineal hygiene, Adjust clothing after toileting Toileting steps completed by helper: Performs perineal hygiene (per Jobelle Mesias, NT report) Toileting Assistive Devices: Grab bar or rail  Toileting  assist Assist level: More than reasonable time   Transfers Chair/bed transfer   Chair/bed transfer method: Ambulatory Chair/bed transfer assist level: Supervision or verbal cues Chair/bed transfer assistive device: Armrests, Medical sales representative     Max distance: 200 ft Assist level: Supervision or verbal cues   Wheelchair   Type: Manual Max wheelchair distance: 30 ft Assist Level: Touching or steadying assistance (Pt > 75%)  Cognition Comprehension Comprehension assist level: Follows complex conversation/direction with no assist  Expression Expression assist level: Expresses complex ideas: With extra time/assistive device  Social Interaction Social Interaction assist level: Interacts appropriately with others - No medications needed.  Problem Solving Problem solving assist level: Solves complex problems: With extra time  Memory Memory assist level: Assistive device: No helper    Medical Problem List and Plan: 1.  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   D/c today  Will see patient for transitional care management in 1-2 weeks.   2.  DVT Prophylaxis/Anticoagulation: Pharmaceutical: Lovenox 3. Pain Management: N/A 4. Mood: LCSW to follow for evaluation and support.  5. Neuropsych: This patient is capable of making decisions on her own behalf. 6. Skin/Wound Care: routine pressure relief mesures 7. Fluids/Electrolytes/Nutrition: Monitor I/Os  BMP within acceptable range on 5/14 8. 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   Within range 5/15 9. T2DM: Poorly controlled with Hgb A1c  Lantus increased to 40U on 5/9, increased to 48U on 5/11 with meal coverage - will not send home with meal coverage.   Metformin at 500 BID, pt cannot tolerate higher dose  Hypoglycemia 5/15, will adjust insulin at discharge.  Will need further monitoring as outpt  10. Dyslipidemia: On Lipitor.  11. Transaminitis  Cont  to monitor LFTs 12. Diarrhea  Resolved   LOS (Days) 8 A FACE TO FACE EVALUATION WAS PERFORMED  Syriah Delisi Lorie Phenix 10/12/2016 9:38 AM

## 2016-10-12 NOTE — Progress Notes (Signed)
Hypoglycemic Event  CBG: 49 Treatment: 15 GM carbohydrate snack  Symptoms: None  Follow-up CBG: [id number] CBG Result:124 Possible Reasons for Event: Inadequate meal intake  Comments/MD notified: to notify   Zethan Alfieri, [employer]

## 2016-10-12 NOTE — Discharge Instructions (Signed)
Inpatient Rehab Discharge Instructions  Shelley Frazier Discharge date and time:  10/12/16  Activities/Precautions/ Functional Status: Activity: no lifting, driving, or strenuous exercise for till cleared by MD Diet: cardiac diet and diabetic diet  Wound Care: Keep a Band-Aid on puncture site for now.    Functional status:  ___ No restrictions     ___ Walk up steps independently _X__ 24/7 supervision/assistance   ___ Walk up steps with assistance ___ Intermittent supervision/assistance  ___ Bathe/dress independently ___ Walk with walker     ___ Bathe/dress with assistance ___ Walk Independently    ___ Shower independently ___ Walk with assistance    _X__ Shower with supervision _X__ No alcohol     ___ Return to work/school ________   Special Instructions: 1. Monitor blood sugars at lest twice a day before meals and/or at bedtime--alternate breakfast/supper with lunch/bedtime. 2. Discuss any medications issues with MD before discontinuation.    COMMUNITY REFERRALS UPON DISCHARGE:    Outpatient: PT & OT  Agency:[facility] [place]   Phone: [phone]   Date of Last Service:10/12/2016  Appointment Date/Time:MAY 17 Friday 10:15-12:00  Medical Equipment/Items Ordered:CANE & BEDSIDE COMMODE  Agency/Supplier:[facility]  [phone]   GENERAL COMMUNITY RESOURCES FOR PATIENT/FAMILY: Support Groups:[facility] SUPPORT GROUP    EVERY SECOND Thursday @ 3:00-4:00 PM ON THE REHAB UNIT ( SEPTEMBER-MAY) QUESTIONS CONTACT [facility] [phone]    Employment Assistance:FMLA FORMS COMPLETED   STROKE/TIA DISCHARGE INSTRUCTIONS SMOKING Cigarette smoking nearly doubles your risk of having a stroke & is the single most alterable risk factor  If you smoke or have smoked in the last 12 months, you are advised to quit smoking for your health.  Most of the excess cardiovascular risk related to smoking disappears within a year of stopping.  Ask you doctor  about anti-smoking medications  [place] Quit Line: 1-800-QUIT NOW  Free Smoking Cessation Classes [phone]  CHOLESTEROL Know your levels; limit fat & cholesterol in your diet  Lipid Panel     Component Value Date/Time   CHOL 267 (H) 10/02/2016 1234   TRIG 660 (H) 10/02/2016 1234   HDL 25 (L) 10/02/2016 1234   CHOLHDL 10.7 10/02/2016 1234   VLDL UNABLE TO CALCULATE IF TRIGLYCERIDE OVER 400 mg/dL 10/02/2016 1234   LDLCALC UNABLE TO CALCULATE IF TRIGLYCERIDE OVER 400 mg/dL 10/02/2016 1234      Many patients benefit from treatment even if their cholesterol is at goal.  Goal: Total Cholesterol (CHOL) less than 160  Goal:  Triglycerides (TRIG) less than 150  Goal:  HDL greater than 40  Goal:  LDL (LDLCALC) less than 100   BLOOD PRESSURE American Stroke Association blood pressure target is less that 120/80 mm/Hg  Your discharge blood pressure is:  BP: (!) 144/63  Monitor your blood pressure  Limit your salt and alcohol intake  Many individuals will require more than one medication for high blood pressure  DIABETES (A1c is a blood sugar average for last 3 months) Goal HGBA1c is under 7% (HBGA1c is blood sugar average for last 3 months)  Diabetes:     Lab Results  Component Value Date   HGBA1C 13.6 (H) 10/01/2016     Your HGBA1c can be lowered with medications, healthy diet, and exercise.  Check your blood sugar as directed by your physician  Call your physician if you experience unexplained or low blood sugars.  PHYSICAL ACTIVITY/REHABILITATION Goal is 30 minutes at least 4 days per week  Activity: No driving Therapies: see  above Return to work: to be decided on follow up  Activity decreases your risk of heart attack and stroke and makes your heart stronger.  It helps control your weight and blood pressure; helps you relax and can improve your mood.  Participate in a regular exercise program.  Talk with your doctor about the best form of exercise for you (dancing,  walking, swimming, cycling).  DIET/WEIGHT Goal is to maintain a healthy weight  Your discharge diet is: Diet heart healthy/carb modified Room service appropriate? Yes; Fluid consistency: Thin  liquids Your height is:  Height: 5\' 3"  (160 cm) Your current weight is: Weight: 79 kg (174 lb 2.6 oz) Your Body Mass Index (BMI) is:  BMI (Calculated): 30.9  Following the type of diet specifically designed for you will help prevent another stroke.  Your goal weight is:  141 lbs  Your goal Body Mass Index (BMI) is 19-24.  Healthy food habits can help reduce 3 risk factors for stroke:  High cholesterol, hypertension, and excess weight.  RESOURCES Stroke/Support Group:  Call [phone]   STROKE EDUCATION PROVIDED/REVIEWED AND GIVEN TO PATIENT Stroke warning signs and symptoms How to activate emergency medical system (call 911). Medications prescribed at discharge. Need for follow-up after discharge. Personal risk factors for stroke. Pneumonia vaccine given:  Flu vaccine given:  My questions have been answered, the writing is legible, and I understand these instructions.  I will adhere to these goals & educational materials that have been provided to me after my discharge from the hospital.     My questions have been answered and I understand these instructions. I will adhere to these goals and the provided educational materials after my discharge from the hospital.  Patient/Caregiver Signature _______________________________ Date __________  Clinician Signature _______________________________________ Date __________  Please bring this form and your medication list with you to all your follow-up doctor's appointments.

## 2016-10-13 ENCOUNTER — Telehealth: Payer: Self-pay | Admitting: *Deleted

## 2016-10-13 NOTE — Telephone Encounter (Signed)
Transitional Care Call completed, appointment confirmed, address confirmed, new patient packet mailed   Transitional Care Questions  Questions for our staff to ask patients on Transitional care 48 hour phone call:   1. Are you/is patient experiencing any problems since coming home? No Are there any questions regarding any aspect of care? No  2. Are there any questions regarding medications administration/dosing? No Are meds being taken as prescribed?  Yes Patient should review meds with caller to confirm   3. Have there been any falls? No  4. Has Home Health been to the house and/or have they contacted you? Outpatient Therapy, no homehealthIf not, have you tried to contact them? Can we help you contact them?   5. Are bowels and bladder emptying properly? Yes Are there any unexpected incontinence issues?  No  If applicable, is patient following bowel/bladder programs?  6. Any fevers, problems with breathing, unexpected pain?  No   7. Are there any skin problems or new areas of breakdown? No  8. Has the patient/family member arranged specialty MD follow up (ie cardiology/neurology/renal/surgical/etc)?  Yes Can we help arrange? No   9. Does the patient need any other services or support that we can help arrange? No  10. Are caregivers following through as expected in assisting the patient? Yes   11. Has the patient quit smoking, drinking alcohol, or using drugs as recommended? No smoke, No drink, No illicit drugs

## 2016-10-14 ENCOUNTER — Ambulatory Visit (HOSPITAL_COMMUNITY): Payer: BC Managed Care – PPO

## 2016-10-14 ENCOUNTER — Encounter (HOSPITAL_COMMUNITY): Payer: Self-pay | Admitting: Physical Therapy

## 2016-10-14 ENCOUNTER — Ambulatory Visit (HOSPITAL_COMMUNITY): Payer: BC Managed Care – PPO | Attending: Physical Medicine and Rehabilitation | Admitting: Physical Therapy

## 2016-10-14 DIAGNOSIS — R278 Other lack of coordination: Secondary | ICD-10-CM

## 2016-10-14 DIAGNOSIS — M6281 Muscle weakness (generalized): Secondary | ICD-10-CM | POA: Insufficient documentation

## 2016-10-14 DIAGNOSIS — R29898 Other symptoms and signs involving the musculoskeletal system: Secondary | ICD-10-CM | POA: Diagnosis present

## 2016-10-14 DIAGNOSIS — R262 Difficulty in walking, not elsewhere classified: Secondary | ICD-10-CM | POA: Insufficient documentation

## 2016-10-14 DIAGNOSIS — R2689 Other abnormalities of gait and mobility: Secondary | ICD-10-CM | POA: Diagnosis present

## 2016-10-14 DIAGNOSIS — R2681 Unsteadiness on feet: Secondary | ICD-10-CM | POA: Diagnosis present

## 2016-10-14 NOTE — Patient Instructions (Signed)
Home Exercises Program Theraputty Exercises  Do the following exercises 2-3 times a day using your affected hand. 15-30 minutes. 1. Roll putty into a ball.  2. Make into a pancake.  3. Roll putty into a roll.  4. Pinch along log with first finger and thumb.   5. Make into a ball.  6. Roll it back into a log.   7. Pinch using thumb and side of first finger.  8. Roll into a ball, then flatten into a pancake.  9. Using your fingers, make putty into a mountain.

## 2016-10-14 NOTE — Therapy (Signed)
[place], [place], [address] Phone: [phone]   Fax:  [phone]  Physical Therapy Evaluation  Patient Details  Name: Shelley Frazier MRN: [id number] Date of Birth: [birth year] Referring Provider: Reesa Chew PA-C   Encounter Date: 10/14/2016      PT End of Session - 10/14/16 1216    Visit Number 1   Number of Visits 9  financial concerns over co-pay    Date for PT Re-Evaluation 11/11/16   Authorization Type [place] Time Period 10/14/16 to 12/14/16   PT Start Time 1035   PT Stop Time 1114   PT Time Calculation (min) 39 min   Equipment Utilized During Treatment Gait belt   Activity Tolerance Patient tolerated treatment well   Behavior During Therapy [facility] for tasks assessed/performed      Past Medical History:  Diagnosis Date  . Back pain   . Diabetes mellitus without complication ([place])   . Fatty liver   . Gallstones   . Hot flashes 07/30/2013  . Hypertension   . Obesity   . Perimenopausal symptoms 07/30/2013    Past Surgical History:  Procedure Laterality Date  . TONSILLECTOMY AND ADENOIDECTOMY    . TUBAL LIGATION      There were no vitals filed for this visit.       Subjective Assessment - 10/14/16 1037    Subjective Patient arrives after having a CVA on May 4th; she went to [facility] first and was then sent to [facility], she got inpatient rehab there. She did not get HHPT, instead came right to outpatient. SHe states that using her R hand is difficult, her R leg is a little slow. No falls or close calls, she was not using the cane before her stroke.    Pertinent History L pons CVA, basilar artery stenosis, HTN, DM, back pain    Patient Stated Goals improved R LE strenght/speed, return to PLOF    Currently in Pain? No/denies            [facility] PT Assessment - 10/14/16 0001      Assessment   Medical Diagnosis L pons stroke    Referring Provider Reesa Chew  PA-C    Onset Date/Surgical Date 10/01/16   Next MD Visit they do not see Reesa Chew again, they have appt next week with Dr. Posey Pronto    Prior Therapy PT in hospital      Precautions   Precautions [place]   Has the patient fallen in the past 6 months No   Has the patient had a decrease in activity level because of a fear of falling?  Yes   Is the patient reluctant to leave their home because of a fear of falling?  No     Prior Function   Level of Independence Independent;Independent with basic ADLs;Independent with gait;Independent with transfers   Vocation Full time employment   Vocation Requirements office work at [employer]    Leisure camping      Strength   Right Hip Flexion 3/5   Right Hip Extension 2+/5   Right Hip ABduction 3-/5   Left Hip Flexion 5/5   Left Hip Extension 3/5   Left Hip ABduction 4+/5   Right Knee Flexion 3-/5   Right Knee Extension 3/5   Left Knee Flexion 4/5   Left Knee Extension 5/5   Right Ankle Dorsiflexion 3/5  Left Ankle Dorsiflexion 5/5     Ambulation/Gait   Gait Comments scissoring gait pattern, reduced gait speed, reduced stance R LE, reduced toe clearnace R LE, mild knee hyperextension R      6 minute walk test results    Aerobic Endurance Distance Walked 472   Endurance additional comments 3MWT, no device      Standardized Balance Assessment   Standardized Balance Assessment Dynamic Gait Index     Dynamic Gait Index   Level Surface Moderate Impairment   Change in Gait Speed Mild Impairment   Gait with Horizontal Head Turns Mild Impairment   Gait with Vertical Head Turns Mild Impairment   Gait and Pivot Turn Moderate Impairment   Step Over Obstacle Moderate Impairment   Step Around Obstacles Moderate Impairment   Steps Moderate Impairment   Total Score 11                           PT Education - 10/14/16 1216    Education provided Yes   Education Details typical course of recovery after CVA;  prognosis/exam findings, POC, HEP    Person(s) Educated Patient;Spouse   Methods Explanation;Demonstration;Handout   Comprehension Verbalized understanding;Need further instruction          PT Short Term Goals - 10/14/16 1220      PT SHORT TERM GOAL #1   Title Patient to be able to complete functional sit to stand with even weightbearing/no offshift to L LE to show improved functional strength and proprioception post-CVA    Time 4   Period Weeks   Status New     PT SHORT TERM GOAL #2   Title Patient to show improved gait pattern including reduced scissoring, improved R LE stance form and time, equal step lengths, increased gait speed and elimination of mild drift L/R during gait to show improved safety and efficiency of mobility    Time 4   Period Weeks   Status New     PT SHORT TERM GOAL #3   Title Patient to be participatory in regular walking program, at least 10-15 minutes continuously and 5 days per week, in order to address reduced functional activity tolerance and promote recovery post-CVA    Time 4   Period Weeks   Status New     PT SHORT TERM GOAL #4   Title Patient to correctly and consistently perform appropriate HEP, to be updated weekly    Time 1   Period Weeks   Status New           PT Long Term Goals - 10/14/16 1223      PT LONG TERM GOAL #1   Title Patient to show functional strength as having improved by 1 MMT grade in all tested muscles in order to improve R LE stabilty and overall balance/gait    Time 8   Period Weeks   Status New     PT LONG TERM GOAL #2   Title Patient to be able to ambulate 621ft during 3MWT without device and minimal fatigue in order to show improved mobility and community access    Time 8   Period Weeks   Status New     PT LONG TERM GOAL #3   Title Patient to score at least 20/24 on DGI in order to show improved dynamic balance and reduced overall fall risk    Time 8   Period Weeks   Status New  PT LONG TERM GOAL  #4   Title Patient to be able to complete floor to stand transfer with Mod(I) in order to show independence in safely recovering in the event of a fall as well as improved functional strength    Time 8   Period Weeks   Status New     PT LONG TERM GOAL #5   Title Patient to be ambulating safely in community without assistive device and minimal unsteadiness in order to assist in returning to PLOF based activities    Time [place] - 10/14/16 1218    Clinical Impression Statement Patient arrives after L pons CVA which she reports occurred on May 4th; she received skilled CIR services and is now arriving at OP PT for further skilled care. She is highly motivated and with high PLOF, and appears to have excellent potential for progressing well with skilled PT services. Examination reveals significant functional weakness R LE, impaired gross coordination, significant gait deviation, notable unsteadiness as indicated per DGI, and reduced functional activity tolerance at this time. Strongly recommend skilled PT services to address functional deficits, reduce fall risk, and assist in return to PLOF based activities.    Rehab Potential Excellent   Clinical Impairments Affecting Rehab Potential (+) high PLOF, very high level of motivation to participate with PT, supportive family    PT Frequency 1x / week  discussed 2x/week however patient/spouse prefer 1x/week due to financial concerns with co-pay    PT Duration 8 weeks   PT Treatment/Interventions ADLs/Self Care Home Management;Electrical Stimulation;DME Instruction;Gait training;Stair training;Functional mobility training;Therapeutic activities;Therapeutic exercise;Balance training;Neuromuscular re-education;Patient/family education;Orthotic Fit/Training;Manual techniques;Energy conservation;Taping   PT Next Visit Plan review initial eval/goals, HEP; functional strength with neuro re-ed strategy of L LE  before R with all tasks. Balance training, gait training with no device. Trial Nustep and/or TM with B UEs on gait trainer setting    PT Home Exercise Plan Eval: tall kneeling with weight shift to R LE, reciprocal crawling forwards and backwards on bed, sit to stand with weight shift to R LE    Consulted and Agree with Plan of Care Patient;Family member/caregiver   Family Member Consulted spouse       Patient will benefit from skilled therapeutic intervention in order to improve the following deficits and impairments:  Abnormal gait, Decreased coordination, Decreased mobility, Impaired tone, Decreased activity tolerance, Decreased strength, Decreased balance, Difficulty walking  Visit Diagnosis: Muscle weakness (generalized) - Plan: PT plan of care cert/re-cert  Unsteadiness on feet - Plan: PT plan of care cert/re-cert  Other abnormalities of gait and mobility - Plan: PT plan of care cert/re-cert  Other lack of coordination - Plan: PT plan of care cert/re-cert  Difficulty in walking, not elsewhere classified - Plan: PT plan of care cert/re-cert     Problem List Patient Active Problem List   Diagnosis Date Noted  . Hypoglycemia   . Hypertriglyceridemia 10/11/2016  . Diarrhea due to malabsorption   . Benign essential HTN   . Transaminitis   . Acute ischemic stroke ([place])   . Hyperglycemia   . Chronic diastolic congestive heart failure ([place])   . Right hemiparesis ([place])   . Dysarthria, post-stroke   . Poorly controlled diabetes mellitus ([place])   . Dyslipidemia   . Mixed hyperlipidemia   . Basilar artery stenosis   . 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[place]) 10/01/2016  .  DM (diabetes mellitus), type 2 with neurological complications ([place]) 20/60/1561  . Hot flashes 07/30/2013  . Hypertension 07/30/2013  . Perimenopausal symptoms 07/30/2013    Deniece Ree PT, DPT [facility] [address] [place], [place], [address] Phone:  [phone]   Fax:  [phone]  Name: Shelley Frazier MRN: [id number] Date of Birth: 15-Oct-1965

## 2016-10-14 NOTE — Therapy (Signed)
[place], [place], [address] Phone: [phone]   Fax:  [phone]  Occupational Therapy Evaluation  Patient Details  Name: Shelley Frazier MRN: [id number] Date of Birth: 07-14-1965 Referring Provider: Reesa Chew, PA  Encounter Date: 10/14/2016      OT End of Session - 10/14/16 1546    Visit Number 1   Number of Visits 6   Date for OT Re-Evaluation 11/25/16   mini reassess: 11/11/16   Authorization Type [place] $25 co pay   OT Start Time 1120   OT Stop Time 1200   OT Time Calculation (min) 40 min   Activity Tolerance Patient tolerated treatment well   Behavior During Therapy [facility] for tasks assessed/performed      Past Medical History:  Diagnosis Date  . Back pain   . Diabetes mellitus without complication ([place])   . Fatty liver   . Gallstones   . Hot flashes 07/30/2013  . Hypertension   . Obesity   . Perimenopausal symptoms 07/30/2013    Past Surgical History:  Procedure Laterality Date  . TONSILLECTOMY AND ADENOIDECTOMY    . TUBAL LIGATION      There were no vitals filed for this visit.      Subjective Assessment - 10/14/16 1123    Subjective  S: I've been working at home on a lot of things.    Patient is accompained by: Family member  Husband   Pertinent History Patient is 51 y/o female S/P left pontine stroke which occurred on 10/01/16. Pt was admitted to [facility] and received therapy at [facility] 10/04/16-10/12/16. Reesa Chew, PA has referred patient to occupational therapy for evaluation and treatment.    Patient Stated Goals TO get stronger.   Currently in Pain? No/denies           [facility] OT Assessment - 10/14/16 1123      Assessment   Diagnosis right side weakness   Referring Provider Reesa Chew, PA   Onset Date 10/01/16   Assessment Follow up apoointment today with Reesa Chew, PA   Prior Therapy CIR     Precautions   Precautions Fall   Precaution Comments Due to right  side upper and lower extremity weakness     Balance Screen   Has the patient fallen in the past 6 months No     Home  Environment   Family/patient expects to be discharged to: Private residence   Living Arrangements Spouse/significant other  2 kids 12 and 73 y/o   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 patient has not been able to return to meal prep as she has in the past.      Written Expression   Dominant Hand Right     Vision - History   Baseline Vision No visual deficits   Patient Visual Report Other (comment)   Additional Comments Vision changes: Fluzzy distance  and near and distance.      Cognition   Overall Cognitive Status Within Functional Limits for tasks assessed     Sensation   Light  Touch Appears Intact   Stereognosis Appears Intact   Hot/Cold Appears Intact   Proprioception Appears Intact     Coordination   9 Hole Peg Test Right;Left   Right 9 Hole Peg Test 54.9"   Left 9 Hole Peg Test 50.1"   Box and Blocks R: 36 L: 60     ROM / Strength   AROM / PROM / Strength AROM;Strength     Strength   Overall Strength Comments Assessed seated. IR/er adducted   Strength Assessment Site Shoulder;Elbow;Forearm;Wrist;Hand   Right/Left Shoulder Right   Right Shoulder Flexion 3+/5   Right Shoulder ABduction 3+/5   Right Shoulder Internal Rotation 3/5   Right Shoulder External Rotation 3/5   Right/Left Elbow Right   Right Elbow Flexion 3+/5   Right Elbow Extension 3+/5   Right/Left Forearm Right   Right Forearm Pronation 3/5   Right Forearm Supination 3/5   Right/Left Wrist  Right   Right Wrist Flexion 3+/5   Right Wrist Extension 3+/5   Right/Left hand Right;Left   Right Hand Grip (lbs) 15   Right Hand Lateral Pinch 6 lbs   Right Hand 3 Point Pinch 5 lbs   Left Hand Grip (lbs) 50   Left Hand Lateral Pinch 18 lbs   Left Hand 3 Point Pinch 16 lbs                           OT Short Term Goals - 10/14/16 1554      OT SHORT TERM GOAL #1   Title Patient will be educated and independent with HEP to increase functional use of RUE as dominant with all tasks 75% of the time.    Time 6   Period Weeks   Status New     OT SHORT TERM GOAL #2   Title Patient will increase grip strength by 20# and pinch strength by 10# to increase ability to hold onto items without dropping.    Time 6   Period Weeks   Status New     OT SHORT TERM GOAL #3   Title Patient will increase RUE strength in shoulder, elbow, and wrist to 4/5 to increase ability to return to cooking meals on a more regular basis.    Time 6   Period Weeks   Status New     OT SHORT TERM GOAL #4   Title Patient will increase fine motor coordination by completing 9 hole peg test in 45 seconds or less.    Time 6   Period Weeks   Status New     OT SHORT TERM GOAL #5   Title Patient will increase gross motor coordination by completing box and block test and transferring 30 blocks with right hand.    Time 6   Period Weeks   Status New                  Plan - 10/14/16 1548    Clinical Impression Statement A: Patient is a 51 y/o female S/P left CVA causing right side weakness, lack of cordination, and increased difficulty completind daily tasks.    Rehab Potential Excellent   OT Frequency 1x / week   OT Duration 6 weeks   OT Treatment/Interventions Self-care/ADL training;Therapeutic exercise;Patient/family education;Neuromuscular education;Ultrasound;Therapeutic activities;DME and/or AE instruction;Cryotherapy;Electrical Stimulation;Moist Heat;Passive range of  motion;Visual/perceptual remediation/compensation   Plan P: Patient will benefit from skilled OT services to increase functional performance during ADL tasks. Treatment Plan: fine  motor and gross motor coordination tasks, RUE strengthening, including grip and pinch and proximal shoulder strengthening and stability.    OT Home Exercise Plan 5/17: Pt was already given A/ROM shoulder exercises to complete from CIR. Currently completeing coordination tasks. Provided patient with yellow and red theraputty to work on grip and pinch strengthening.    Consulted and Agree with Plan of Care Patient;Family member/caregiver   Family Member Consulted Husband      Patient will benefit from skilled therapeutic intervention in order to improve the following deficits and impairments:  Decreased strength, Decreased coordination, Impaired UE functional use, Decreased range of motion  Visit Diagnosis: Other lack of coordination  Other symptoms and signs involving the musculoskeletal system    Problem List Patient Active Problem List   Diagnosis Date Noted  . Hypoglycemia   . Hypertriglyceridemia 10/11/2016  . Diarrhea due to malabsorption   . Benign essential HTN   . Transaminitis   . Acute ischemic stroke ([place])   . Hyperglycemia   . Chronic diastolic congestive heart failure ([place])   . Right hemiparesis ([place])   . Dysarthria, post-stroke   . Poorly controlled diabetes mellitus ([place])   . Dyslipidemia   . Mixed hyperlipidemia   . Basilar artery stenosis   . 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[place]) 10/01/2016  . DM (diabetes mellitus), type 2 with neurological complications ([place]) 39/07/90  . Hot flashes 07/30/2013  . Hypertension 07/30/2013  . Perimenopausal symptoms 07/30/2013   Ailene Ravel, OTR/L,CBIS  [phone]  10/14/2016, 3:57 PM  [place] [address] [place], [place], [address] Phone: [phone]   Fax:  [phone]  Name: Shelley Frazier MRN: [id number] Date of Birth: 1966-04-12

## 2016-10-14 NOTE — Patient Instructions (Signed)
   SIT TO STAND - NO SUPPORT  Start by scooting close to the front of the chair.  Next, lean forward at your trunk and reach forward with your arms and rise to standing without using your hands to push off from the chair or other object.   You can either slide your right foot back a little bit, OR clasp your hands on the outside of your right knee to assist with weight shift to the right leg.   Stand up, really focusing on trying to get as much weight down through your right leg.   Repeat 10 times, twice a day.   CRAWLING  Crawl forwards and backwards on the long side of your bed, 3-4 times, twice a day.     Tall Kneeling  Transition from quadruped > up onto both knees, shoulders back, tall and proud, both arms out wide. Exhale out as you come up into position.  Maintain this position as long as you can.  Really focus on trying to bear as much weight as you can down through the right leg.  Repeat 3 times with maximum holds, twice a day.

## 2016-10-20 ENCOUNTER — Encounter
Payer: BC Managed Care – PPO | Attending: Physical Medicine & Rehabilitation | Admitting: Physical Medicine & Rehabilitation

## 2016-10-20 ENCOUNTER — Encounter: Payer: Self-pay | Admitting: Physical Medicine & Rehabilitation

## 2016-10-20 VITALS — BP 154/86 | HR 72

## 2016-10-20 DIAGNOSIS — I639 Cerebral infarction, unspecified: Secondary | ICD-10-CM | POA: Diagnosis present

## 2016-10-20 DIAGNOSIS — K760 Fatty (change of) liver, not elsewhere classified: Secondary | ICD-10-CM | POA: Diagnosis not present

## 2016-10-20 DIAGNOSIS — G8191 Hemiplegia, unspecified affecting right dominant side: Secondary | ICD-10-CM | POA: Diagnosis not present

## 2016-10-20 DIAGNOSIS — E669 Obesity, unspecified: Secondary | ICD-10-CM | POA: Insufficient documentation

## 2016-10-20 DIAGNOSIS — I10 Essential (primary) hypertension: Secondary | ICD-10-CM | POA: Diagnosis not present

## 2016-10-20 DIAGNOSIS — I69322 Dysarthria following cerebral infarction: Secondary | ICD-10-CM | POA: Diagnosis not present

## 2016-10-20 DIAGNOSIS — I651 Occlusion and stenosis of basilar artery: Secondary | ICD-10-CM

## 2016-10-20 DIAGNOSIS — Z5189 Encounter for other specified aftercare: Secondary | ICD-10-CM | POA: Insufficient documentation

## 2016-10-20 DIAGNOSIS — E119 Type 2 diabetes mellitus without complications: Secondary | ICD-10-CM | POA: Insufficient documentation

## 2016-10-20 DIAGNOSIS — E1149 Type 2 diabetes mellitus with other diabetic neurological complication: Secondary | ICD-10-CM | POA: Diagnosis not present

## 2016-10-20 DIAGNOSIS — I6350 Cerebral infarction due to unspecified occlusion or stenosis of unspecified cerebral artery: Secondary | ICD-10-CM | POA: Diagnosis not present

## 2016-10-20 DIAGNOSIS — R269 Unspecified abnormalities of gait and mobility: Secondary | ICD-10-CM | POA: Diagnosis not present

## 2016-10-20 NOTE — Progress Notes (Signed)
Subjective:    Patient ID: Shelley Frazier, female    DOB: 17-Mar-1966, 51 y.o.   MRN: [id number]  HPI 51 y.o. right hand female with history of HTN, T2DM, fatty liver who presents for transitional care management after receiving CIR for acute nonhemorrhagic left pontine brainstem infarct.   Admit date: 10/04/2016 Discharge date: 10/12/2016  At discharge, she was instructed to check her CBGs, which have been in the 110s.  Her BP has been relatively stable. She has not made an appointment with Dr. Erlinda Hong.  He saw PCP last week.  Denies falls.   DME: shower chair Therapies: Outpt 1/week Mobility: Cane at all times.   Pain Inventory Average Pain 0 Pain Right Now 0 My pain is .  In the last 24 hours, has pain interfered with the following? General activity 0 Relation with others 0 Enjoyment of life 0 What TIME of day is your pain at its worst? . Sleep (in general) Fair  Pain is worse with: na Pain improves with: na Relief from Meds: na  Mobility walk with assistance use a cane ability to climb steps?  yes do you drive?  yes  Function employed # of hrs/week 40 I need assistance with the following:  household duties and shopping  Neuro/Psych weakness  Prior Studies Any changes since last visit?  no  Physicians involved in your care Any changes since last visit?  no   Family History  Problem Relation Age of Onset  . Hypertension Mother   . Gallbladder disease Mother   . Diabetes Father   . Hypertension Father   . Gallbladder disease Maternal Aunt   . Heart disease Paternal Aunt   . Heart disease Paternal Uncle   . Diabetes Maternal Grandfather   . Heart disease Paternal Grandmother   . Diabetes Paternal Grandmother    Social History   Social History  . Marital status: Married    Spouse name: N/A  . Number of children: N/A  . Years of education: N/A   Social History Main Topics  . Smoking status: Never Smoker  . Smokeless tobacco: Never Used  . Alcohol  use No  . Drug use: No  . Sexual activity: Yes    Birth control/ protection: Surgical, Post-menopausal     Comment: tubal   Other Topics Concern  . Not on file   Social History Narrative  . No narrative on file   Past Surgical History:  Procedure Laterality Date  . TONSILLECTOMY AND ADENOIDECTOMY    . TUBAL LIGATION     Past Medical History:  Diagnosis Date  . Back pain   . Diabetes mellitus without complication ([place])   . Fatty liver   . Gallstones   . Hot flashes 07/30/2013  . Hypertension   . Obesity   . Perimenopausal symptoms 07/30/2013   BP (!) 154/86   Pulse 72   LMP 04/18/2013   SpO2 96%   Opioid Risk Score:   Fall Risk Score:  `1  Depression screen PHQ 2/9  Depression screen PHQ 2/9 07/28/2016  Decreased Interest 0  Down, Depressed, Hopeless 0  PHQ - 2 Score 0    Review of Systems  HENT: Negative.   Eyes: Negative.   Respiratory: Negative.   Cardiovascular: Negative.   Gastrointestinal: Negative.   Endocrine: Negative.   Genitourinary: Negative.   Musculoskeletal: Positive for gait problem.  Skin: Negative.   Allergic/Immunologic: Negative.   Neurological: Positive for speech difficulty and weakness.  Hematological: Negative.  Psychiatric/Behavioral: Negative.   All other systems reviewed and are negative.     Objective:   Physical Exam Constitutional: She appears well-developed and well-nourished. NAD. HENT: Normocephalic and atraumatic.  Eyes: EOMI. No discharge.  Cardiovascular: RRR no JVD. Respiratory: CTA B with normal effort GI: Soft. Bowel sounds are normal.  Musculoskeletal: She exhibits no edema or tenderness.  Neurological: She is alert and oriented.  Right facial weakness with mild dysarthria Sensation intact to light touch throughout. Able to follow basic commands without difficulty.  Motor: LUE/LLE 5/5 proximal to distal  RUE: 4-/5 proximal to distal  (improving) RLE: 4+/5 proximal to distal (improving)  Skin: Skin is warm  and dry.  Psychiatric: She has a normal mood and affect. Her behavior is normal. Judgment and thought content normal     Assessment & Plan:  51 y.o. right hand female with history of HTN, T2DM, fatty liver who presents for transitional care management after receiving CIR for acute nonhemorrhagic left pontine brainstem infarct.  1.  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   Cont therapies  Cont follow up with Neurology - needs to make appointment  Cont meds  2. HTN  Cont meds  Cont follow up with PCP  BP in reference rage (SBP 130-150) for the most part  3. T2DM  Cont meds  Cont follow up with PCP  In relatively good control at present  4. Neurological gait abnormality  Cont cane for safety  Meds reviewed Referrals reviewed- needs to make appointment with Neurology All questions answered

## 2016-10-27 ENCOUNTER — Encounter (HOSPITAL_COMMUNITY): Payer: Self-pay | Admitting: Occupational Therapy

## 2016-10-27 ENCOUNTER — Ambulatory Visit (HOSPITAL_COMMUNITY): Payer: BC Managed Care – PPO | Admitting: Physical Therapy

## 2016-10-27 ENCOUNTER — Ambulatory Visit (HOSPITAL_COMMUNITY): Payer: BC Managed Care – PPO | Admitting: Occupational Therapy

## 2016-10-27 DIAGNOSIS — R278 Other lack of coordination: Secondary | ICD-10-CM

## 2016-10-27 DIAGNOSIS — M6281 Muscle weakness (generalized): Secondary | ICD-10-CM

## 2016-10-27 DIAGNOSIS — R2689 Other abnormalities of gait and mobility: Secondary | ICD-10-CM

## 2016-10-27 DIAGNOSIS — R262 Difficulty in walking, not elsewhere classified: Secondary | ICD-10-CM

## 2016-10-27 DIAGNOSIS — R2681 Unsteadiness on feet: Secondary | ICD-10-CM

## 2016-10-27 DIAGNOSIS — R29898 Other symptoms and signs involving the musculoskeletal system: Secondary | ICD-10-CM

## 2016-10-27 NOTE — Patient Instructions (Signed)
   Forward Step Ups  Place foot of affected leg on step.  Shift your weight entirely onto that foot.  Without pushing off of the foot on the floor, extend (straighten) your knee; do this by tightening/activating your quads on your affected leg.  Carefully step back down, and repeat 10-15 times each leg, 1-2 times per day.    LATERAL STEP-UPS  Stand on stool or step with involved leg.  Lower the uninvolved leg until the heel touches the floor, then press back up using the muscles in the involved leg only.  Repeat 10-15 times each leg, 1-2 times per day.     TANDEM STANCE BALANCE  Stand and balnace in tandem stance. Hold this position. You may do this exercise near the kitchen sink for safety.  Hold as long as you can, then switch your feet.  Repeat 3 times each side, 1-2 times per day.    [place]  Perform this exercise in the hallway at home..  With knees slightly flexed, side step leading with the outside of your heel, keeping toes straight ahead.  Maintain hip knee and foot alignment.  Perform 2-3 full lengths (down and back_ of your hallway), 1-2 times per day.

## 2016-10-27 NOTE — Therapy (Signed)
[place], [place], [address] Phone: [phone]   Fax:  [phone]  Physical Therapy Treatment  Patient Details  Name: Shelley Frazier MRN: [id number] Date of Birth: February 23, 1966 Referring Provider: Reesa Chew PA-C   Encounter Date: 10/27/2016      PT End of Session - 10/27/16 1203    Visit Number 2   Number of Visits 9   Date for PT Re-Evaluation 11/11/16   Authorization Type [place] Time Period 10/14/16 to 12/14/16   PT Start Time 1116   PT Stop Time 1200   PT Time Calculation (min) 44 min   Equipment Utilized During Treatment Gait belt   Activity Tolerance Patient tolerated treatment well   Behavior During Therapy [facility] for tasks assessed/performed      Past Medical History:  Diagnosis Date  . Back pain   . Diabetes mellitus without complication ([place])   . Fatty liver   . Gallstones   . Hot flashes 07/30/2013  . Hypertension   . Obesity   . Perimenopausal symptoms 07/30/2013    Past Surgical History:  Procedure Laterality Date  . TONSILLECTOMY AND ADENOIDECTOMY    . TUBAL LIGATION      There were no vitals filed for this visit.      Subjective Assessment - 10/27/16 1120    Subjective Patient arrives with her husband, reports she had a good trip; she ahs been walking without cane indoors, but it can be hard for her to get up on the bed if she is leading with weak LE    Pertinent History L pons CVA, basilar artery stenosis, HTN, DM, back pain    Patient Stated Goals improved R LE strenght/speed, return to PLOF    Currently in Pain? No/denies                         [facility] Adult PT Treatment/Exercise - 10/27/16 1122      Exercises   Exercises Knee/Hip     Knee/Hip Exercises: Standing   Heel Raises Both;1 set;15 reps   Heel Raises Limitations heel and toe    Forward Lunges Both;1 set;10 reps   Forward Lunges Limitations 4 inch box L before R    Lateral Step Up Both;1 set;10 reps   Lateral Step Up Limitations 4 inch box L before R    Forward Step Up Both;1 set;10 reps   Forward Step Up Limitations 4 inch box L before R    Gait Training gait outside and over uneven surfaces/concrete with min guad; min guard for stairs due to general weakness    Other Standing Knee Exercises sit to stand from standard chair (UEs 3 way) 1x10              Balance Exercises - 10/27/16 1144      Balance Exercises: Standing   Tandem Stance Eyes open;Foam/compliant surface;3 reps;15 secs   SLS Solid surface;3 reps;10 secs   Other Standing Exercises retro and lateral gait in 68ft hallway x2 laps each            PT Education - 10/27/16 1203    Education provided Yes   Education Details review of initial eval/goals, updated HEP    Person(s) Educated Patient;Spouse   Methods Explanation;Demonstration;Handout   Comprehension Verbalized understanding;Returned demonstration          PT Short Term Goals - 10/14/16 1220  PT SHORT TERM GOAL #1   Title Patient to be able to complete functional sit to stand with even weightbearing/no offshift to L LE to show improved functional strength and proprioception post-CVA    Time 4   Period Weeks   Status New     PT SHORT TERM GOAL #2   Title Patient to show improved gait pattern including reduced scissoring, improved R LE stance form and time, equal step lengths, increased gait speed and elimination of mild drift L/R during gait to show improved safety and efficiency of mobility    Time 4   Period Weeks   Status New     PT SHORT TERM GOAL #3   Title Patient to be participatory in regular walking program, at least 10-15 minutes continuously and 5 days per week, in order to address reduced functional activity tolerance and promote recovery post-CVA    Time 4   Period Weeks   Status New     PT SHORT TERM GOAL #4   Title Patient to correctly and consistently perform appropriate HEP, to be  updated weekly    Time 1   Period Weeks   Status New           PT Long Term Goals - 10/14/16 1223      PT LONG TERM GOAL #1   Title Patient to show functional strength as having improved by 1 MMT grade in all tested muscles in order to improve R LE stabilty and overall balance/gait    Time 8   Period Weeks   Status New     PT LONG TERM GOAL #2   Title Patient to be able to ambulate 679ft during 3MWT without device and minimal fatigue in order to show improved mobility and community access    Time 8   Period Weeks   Status New     PT LONG TERM GOAL #3   Title Patient to score at least 20/24 on DGI in order to show improved dynamic balance and reduced overall fall risk    Time 8   Period Weeks   Status New     PT LONG TERM GOAL #4   Title Patient to be able to complete floor to stand transfer with Mod(I) in order to show independence in safely recovering in the event of a fall as well as improved functional strength    Time 8   Period Weeks   Status New     PT LONG TERM GOAL #5   Title Patient to be ambulating safely in community without assistive device and minimal unsteadiness in order to assist in returning to PLOF based activities    Time 8   Period Weeks   Status New               Plan - 10/27/16 1204    Clinical Impression Statement Patient arrives in good spirits, reporting she is walking more without her cane in her home. Reviewed initial eval/goals and then focused session primarily on functional strength and balance today. Included ambulation outside without assistive device, noting good motor planning and safety awareness over uneven surfaces outside, however she does continue to have difficulty with stair navigation possibly due to ongoing weakness R LE. Updated HEP at end of session.    Rehab Potential Excellent   Clinical Impairments Affecting Rehab Potential (+) high PLOF, very high level of motivation to participate with PT, supportive family    PT  Frequency 1x / week  PT Duration 8 weeks   PT Treatment/Interventions ADLs/Self Care Home Management;Electrical Stimulation;DME Instruction;Gait training;Stair training;Functional mobility training;Therapeutic activities;Therapeutic exercise;Balance training;Neuromuscular re-education;Patient/family education;Orthotic Fit/Training;Manual techniques;Energy conservation;Taping   PT Next Visit Plan continue functional strengthening with L before R neuro re-ed, balance. Stair training with no rail or device. Update HEP.    PT Home Exercise Plan Eval: tall kneeling with weight shift to R LE, reciprocal crawling forwards and backwards on bed, sit to stand with weight shift to R LE; 5/30: forward and lateral step ups, tandem stance, lateral stepping    Consulted and Agree with Plan of Care Patient;Family member/caregiver   Family Member Consulted spouse       Patient will benefit from skilled therapeutic intervention in order to improve the following deficits and impairments:  Abnormal gait, Decreased coordination, Decreased mobility, Impaired tone, Decreased activity tolerance, Decreased strength, Decreased balance, Difficulty walking  Visit Diagnosis: Muscle weakness (generalized)  Unsteadiness on feet  Other abnormalities of gait and mobility  Difficulty in walking, not elsewhere classified  Other lack of coordination     Problem List Patient Active Problem List   Diagnosis Date Noted  . Hypoglycemia   . Hypertriglyceridemia 10/11/2016  . Diarrhea due to malabsorption   . Benign essential HTN   . Transaminitis   . Acute ischemic stroke ([place])   . Hyperglycemia   . Chronic diastolic congestive heart failure ([place])   . Right hemiparesis ([place])   . Dysarthria, post-stroke   . Poorly controlled diabetes mellitus ([place])   . Dyslipidemia   . Mixed hyperlipidemia   . Basilar artery stenosis   . 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[place]) 10/01/2016  . DM (diabetes mellitus), type 2 with neurological  complications ([place]) 28/00/3491  . Hot flashes 07/30/2013  . Hypertension 07/30/2013  . Perimenopausal symptoms 07/30/2013    Deniece Ree PT, DPT [facility] [address] [place], [place], [address] Phone: [phone]   Fax:  [phone]  Name: Shelley Frazier MRN: [id number] Date of Birth: 1966/03/21

## 2016-10-27 NOTE — Therapy (Signed)
[place], [place], [address] Phone: [phone]   Fax:  [phone]  Occupational Therapy Treatment  Patient Details  Name: Shelley Frazier MRN: [id number] Date of Birth: 1966-05-21 Referring Provider: Reesa Chew, PA  Encounter Date: 10/27/2016      OT End of Session - 10/27/16 1106    Visit Number 1   Number of Visits 6   Date for OT Re-Evaluation 11/25/16   mini reassess: 11/11/16   Authorization Type [place] $25 co pay   OT Start Time 1033   OT Stop Time 1113   OT Time Calculation (min) 40 min   Activity Tolerance Patient tolerated treatment well   Behavior During Therapy [facility] for tasks assessed/performed      Past Medical History:  Diagnosis Date  . Back pain   . Diabetes mellitus without complication ([place])   . Fatty liver   . Gallstones   . Hot flashes 07/30/2013  . Hypertension   . Obesity   . Perimenopausal symptoms 07/30/2013    Past Surgical History:  Procedure Laterality Date  . TONSILLECTOMY AND ADENOIDECTOMY    . TUBAL LIGATION      There were no vitals filed for this visit.      Subjective Assessment - 10/27/16 1032    Subjective  S: I can touch all my fingers now.    Currently in Pain? No/denies            [facility] OT Assessment - 10/27/16 1031      Assessment   Diagnosis right side weakness     Precautions   Precautions Fall   Precaution Comments Due to right side upper and lower extremity weakness                  OT Treatments/Exercises (OP) - 10/27/16 1035      Exercises   Exercises Hand     Hand Exercises   Hand Gripper with Large Beads all beads gripper 15#   Hand Gripper with Medium Beads all beads gripper 15#   Hand Gripper with Small Beads all beads 11#   Sponges Pt grasped 25 high resistance sponges with RUE using lateral and 3 point pinch and placed into bucket. Increased time required due to fatigue, min/mod difficulty   Small  Pegboard Pt completed small pegboard design, placing pegs in pegboard one at a time. Pt requiring increased time to complete, min difficulty grasping pegs and placing in pegboard. Pt removed pegs and held as many as possible in palm while removing. Pt able to hold approximately 10-15 pegs before placing in container     Fine Motor Coordination   Fine Motor Coordination Picking up coins;Manipulating coins;Stacking coins   Picking up coins Pt picked up 6 coins off tabletop and translated to palm, min difficulty   Manipulating coins Pt practiced palm to fingertip translation to bring 6 coins to fingertips one at a time and drop in slotted container, min difficulty, increased time   Stacking coins Pt held 15 coins in palm, translated to fingertips and stacked. Mod difficulty Pt then picked up stack and dropped into slotted container one at a time.              Balance Exercises - 10/27/16 1144      Balance Exercises: Standing   Tandem Stance Eyes open;Foam/compliant surface;3 reps;15 secs   SLS Solid surface;3 reps;10 secs   Other Standing Exercises retro and lateral gait in  51ft hallway x2 laps each              OT Short Term Goals - 10/27/16 1106      OT SHORT TERM GOAL #1   Title Patient will be educated and independent with HEP to increase functional use of RUE as dominant with all tasks 75% of the time.    Time 6   Period Weeks   Status On-going     OT SHORT TERM GOAL #2   Title Patient will increase grip strength by 20# and pinch strength by 10# to increase ability to hold onto items without dropping.    Time 6   Period Weeks   Status On-going     OT SHORT TERM GOAL #3   Title Patient will increase RUE strength in shoulder, elbow, and wrist to 4/5 to increase ability to return to cooking meals on a more regular basis.    Time 6   Period Weeks   Status On-going     OT SHORT TERM GOAL #4   Title Patient will increase fine motor coordination by completing 9 hole peg  test in 45 seconds or less.    Time 6   Period Weeks   Status On-going     OT SHORT TERM GOAL #5   Title Patient will increase gross motor coordination by completing box and block test and transferring 30 blocks with right hand.    Time 6   Period Weeks   Status On-going                  Plan - 10/27/16 1208    Clinical Impression Statement A: Initiated grip and pinch strengthening and fine motor coordination tasks this session. Pt reports she is now able to touch each of her fingers to her thumb, and during coin task notes she was unable to complete that in the hospital. Pt requiring increased time for completion of tasks, verbal cuing to depress shoulder during tasks.    OT Home Exercise Plan 5/17: Pt was already given A/ROM shoulder exercises to complete from CIR. Currently completeing coordination tasks. Provided patient with yellow and red theraputty to work on grip and pinch strengthening.    Consulted and Agree with Plan of Care Patient;Family member/caregiver   Family Member Consulted Husband   Plan P: Complete shoulder/elbow strengthening, continue with grip strengthening, attempt coordination task with small pegboard and tweezers.       Patient will benefit from skilled therapeutic intervention in order to improve the following deficits and impairments:  Decreased strength, Decreased coordination, Impaired UE functional use, Decreased range of motion  Visit Diagnosis: Other lack of coordination  Other symptoms and signs involving the musculoskeletal system    Problem List Patient Active Problem List   Diagnosis Date Noted  . Hypoglycemia   . Hypertriglyceridemia 10/11/2016  . Diarrhea due to malabsorption   . Benign essential HTN   . Transaminitis   . Acute ischemic stroke ([place])   . Hyperglycemia   . Chronic diastolic congestive heart failure ([place])   . Right hemiparesis ([place])   . Dysarthria, post-stroke   . Poorly controlled diabetes mellitus ([place])   .  Dyslipidemia   . Mixed hyperlipidemia   . Basilar artery stenosis   . 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[place]) 10/01/2016  . DM (diabetes mellitus), type 2 with neurological complications ([place]) 44/31/5400  . Hot flashes 07/30/2013  . Hypertension 07/30/2013  . Perimenopausal symptoms 07/30/2013   Guadelupe Sabin, OTR/L  [phone]  10/27/2016, 12:10 PM  [place], [place], [address] Phone: [phone]   Fax:  [phone]  Name: Shelley Frazier MRN: [id number] Date of Birth: 24-Apr-1966

## 2016-11-03 ENCOUNTER — Ambulatory Visit (HOSPITAL_COMMUNITY): Payer: BC Managed Care – PPO | Attending: Physical Medicine and Rehabilitation | Admitting: Physical Therapy

## 2016-11-03 ENCOUNTER — Encounter (HOSPITAL_COMMUNITY): Payer: Self-pay | Admitting: Specialist

## 2016-11-03 ENCOUNTER — Ambulatory Visit (HOSPITAL_COMMUNITY): Payer: BC Managed Care – PPO | Admitting: Specialist

## 2016-11-03 DIAGNOSIS — R2681 Unsteadiness on feet: Secondary | ICD-10-CM | POA: Diagnosis present

## 2016-11-03 DIAGNOSIS — R2689 Other abnormalities of gait and mobility: Secondary | ICD-10-CM

## 2016-11-03 DIAGNOSIS — M6281 Muscle weakness (generalized): Secondary | ICD-10-CM | POA: Diagnosis not present

## 2016-11-03 DIAGNOSIS — R29898 Other symptoms and signs involving the musculoskeletal system: Secondary | ICD-10-CM | POA: Diagnosis present

## 2016-11-03 DIAGNOSIS — R278 Other lack of coordination: Secondary | ICD-10-CM | POA: Insufficient documentation

## 2016-11-03 DIAGNOSIS — R262 Difficulty in walking, not elsewhere classified: Secondary | ICD-10-CM

## 2016-11-03 NOTE — Therapy (Signed)
[place], [place], [address] Phone: [phone]   Fax:  [phone]  Occupational Therapy Treatment  Patient Details  Name: RHINA KRAMME MRN: [id number] Date of Birth: January 10, 1966 Referring Provider: Reesa Chew, PA  Encounter Date: 11/03/2016      OT End of Session - 11/03/16 1036    Visit Number 3   Number of Visits 6   Date for OT Re-Evaluation 11/25/16  mini reassess on 6/14   Authorization Type [place] $25 co pay   OT Start Time [phone]   OT Stop Time 1028   OT Time Calculation (min) 41 min   Activity Tolerance Patient tolerated treatment well   Behavior During Therapy [facility] for tasks assessed/performed      Past Medical History:  Diagnosis Date  . Back pain   . Diabetes mellitus without complication ([place])   . Fatty liver   . Gallstones   . Hot flashes 07/30/2013  . Hypertension   . Obesity   . Perimenopausal symptoms 07/30/2013    Past Surgical History:  Procedure Laterality Date  . TONSILLECTOMY AND ADENOIDECTOMY    . TUBAL LIGATION      There were no vitals filed for this visit.      Subjective Assessment - 11/03/16 1005    Subjective  S:  I was able to cut up chicken yesterday - I couldnt do it before and now I can.    Currently in Pain? No/denies   Pain Score 0-No pain            [facility] OT Assessment - 11/03/16 1006      Assessment   Diagnosis right side weakness     Precautions   Precautions Fall   Precaution Comments Due to right side upper and lower extremity weakness                  OT Treatments/Exercises (OP) - 11/03/16 1006      Exercises   Exercises Shoulder;Hand     Shoulder Exercises: ROM/Strengthening   "W" Arms 10 times each   X to V Arms 10 times each   Proximal Shoulder Strengthening, Seated 10 times each no rest   Other ROM/Strengthening Exercises holding green therapy ball completed chest press, overhead press, flexion, elbow  flexion, overhead v 10 times each   Other ROM/Strengthening Exercises with red theraband circle patient kept left arm stationary and completed horizontal abduction, flexion, extension 10 times each attempted bilateral reciprocal movements, however patient is not able to move arms in unison      Hand Exercises   Theraputty - Flatten red in standing;  sustained wrist stability push and pull with pvc pipe 5 times with vg to depress shoulders   Theraputty - Roll red   Theraputty - Grip supinated and pronated grasp    Theraputty - Locate Pegs 10 beads              Balance Exercises - 11/03/16 0922      Balance Exercises: Standing   Tandem Stance Eyes open;Foam/compliant surface;3 reps;20 secs   SLS Eyes open;3 reps;15 secs;Foam/compliant surface   Tandem Gait Forward;3 reps;Retro  no UEs inside parallel bars    Other Standing Exercises cone taps on solid surface x4 progrsesive difficulty min guard              OT Short Term Goals - 10/27/16 1106      OT SHORT TERM GOAL #1  Title Patient will be educated and independent with HEP to increase functional use of RUE as dominant with all tasks 75% of the time.    Time 6   Period Weeks   Status On-going     OT SHORT TERM GOAL #2   Title Patient will increase grip strength by 20# and pinch strength by 10# to increase ability to hold onto items without dropping.    Time 6   Period Weeks   Status On-going     OT SHORT TERM GOAL #3   Title Patient will increase RUE strength in shoulder, elbow, and wrist to 4/5 to increase ability to return to cooking meals on a more regular basis.    Time 6   Period Weeks   Status On-going     OT SHORT TERM GOAL #4   Title Patient will increase fine motor coordination by completing 9 hole peg test in 45 seconds or less.    Time 6   Period Weeks   Status On-going     OT SHORT TERM GOAL #5   Title Patient will increase gross motor coordination by completing box and block test and  transferring 30 blocks with right hand.    Time 6   Period Weeks   Status On-going                  Plan - 11/03/16 1037    Clinical Impression Statement A:  Patient demonstrating improved fine motor coordination this date, however patient's speed of coordnation is not WFL.  Bilateral coordination tasks are not [facility] as right arm is not moving at same rate or distance as LUE.    Plan P:  Attempt therband circle exercises with BUE moving in unison, coordination task with small pegbard and tweezers.  patient uses computer at work and has not completed any keyboarding since her stroke.  assess keyboarding skills and incorporate into daily treatment.       Patient will benefit from skilled therapeutic intervention in order to improve the following deficits and impairments:  Decreased strength, Decreased coordination, Impaired UE functional use, Decreased range of motion  Visit Diagnosis: Other lack of coordination  Other symptoms and signs involving the musculoskeletal system    Problem List Patient Active Problem List   Diagnosis Date Noted  . Hypoglycemia   . Hypertriglyceridemia 10/11/2016  . Diarrhea due to malabsorption   . Benign essential HTN   . Transaminitis   . Acute ischemic stroke ([place])   . Hyperglycemia   . Chronic diastolic congestive heart failure ([place])   . Right hemiparesis ([place])   . Dysarthria, post-stroke   . Poorly controlled diabetes mellitus ([place])   . Dyslipidemia   . Mixed hyperlipidemia   . Basilar artery stenosis   . 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[place]) 10/01/2016  . DM (diabetes mellitus), type 2 with neurological complications ([place]) 24/58/0998  . Hot flashes 07/30/2013  . Hypertension 07/30/2013  . Perimenopausal symptoms 07/30/2013     11/03/2016, 10:40 AM Vangie Bicker, [place], OTR/L [facility] [address] [place], [place], [address] Phone: [phone]   Fax:  [phone]  Name:  TENAE GRAZIOSI MRN: [id number] Date of Birth: [birth year]

## 2016-11-03 NOTE — Therapy (Signed)
[place], [place], [address] Phone: [phone]   Fax:  [phone]  Physical Therapy Treatment  Patient Details  Name: Shelley Frazier MRN: [id number] Date of Birth: 09/30/1965 Referring Provider: Reesa Chew PA-C   Encounter Date: 11/03/2016      PT End of Session - 11/03/16 0942    Visit Number 3   Number of Visits 9   Date for PT Re-Evaluation 11/11/16   Authorization Type [place] Time Period 10/14/16 to 12/14/16   PT Start Time 0900   PT Stop Time 0940   PT Time Calculation (min) 40 min   Activity Tolerance Patient tolerated treatment well   Behavior During Therapy [facility] for tasks assessed/performed      Past Medical History:  Diagnosis Date  . Back pain   . Diabetes mellitus without complication ([place])   . Fatty liver   . Gallstones   . Hot flashes 07/30/2013  . Hypertension   . Obesity   . Perimenopausal symptoms 07/30/2013    Past Surgical History:  Procedure Laterality Date  . TONSILLECTOMY AND ADENOIDECTOMY    . TUBAL LIGATION      There were no vitals filed for this visit.      Subjective Assessment - 11/03/16 0903    Subjective Patient arrives today stating she is doing well, she was sore after last session but it went away quickly. They went camping this weekend and it went well. SHe is mostly keeping up with HEP, has been wokring on stairs a lot at home.    Pertinent History L pons CVA, basilar artery stenosis, HTN, DM, back pain    Patient Stated Goals improved R LE strenght/speed, return to PLOF    Currently in Pain? No/denies                         [facility] Adult PT Treatment/Exercise - 11/03/16 0001      Knee/Hip Exercises: Standing   Heel Raises Both;1 set;20 reps   Heel Raises Limitations heel and toe    Lateral Step Up Both;1 set;15 reps   Lateral Step Up Limitations 6 inch box L before R    Forward Step Up Both;1 set;10 reps   Forward Step Up Limitations 6 inch box L before R    Functional Squat 1 set;15 reps   Functional Squat Limitations cues for form    Rocker Board 2 minutes   Rocker Board Limitations AP and lateral intermittent HHA    Gait Training hurdle navigation (small hurdles) inside parallel bars x4 round trips    Other Standing Knee Exercises standnig marches with 2 second hold 1x10 each LE              Balance Exercises - 11/03/16 0922      Balance Exercises: Standing   Tandem Stance Eyes open;Foam/compliant surface;3 reps;20 secs   SLS Eyes open;3 reps;15 secs;Foam/compliant surface   Tandem Gait Forward;3 reps;Retro  no UEs inside parallel bars    Other Standing Exercises cone taps on solid surface x4 progrsesive difficulty min guard            PT Education - 11/03/16 0941    Education provided No          PT Short Term Goals - 10/14/16 1220      PT SHORT TERM GOAL #1   Title Patient to be able to complete functional  sit to stand with even weightbearing/no offshift to L LE to show improved functional strength and proprioception post-CVA    Time 4   Period Weeks   Status New     PT SHORT TERM GOAL #2   Title Patient to show improved gait pattern including reduced scissoring, improved R LE stance form and time, equal step lengths, increased gait speed and elimination of mild drift L/R during gait to show improved safety and efficiency of mobility    Time 4   Period Weeks   Status New     PT SHORT TERM GOAL #3   Title Patient to be participatory in regular walking program, at least 10-15 minutes continuously and 5 days per week, in order to address reduced functional activity tolerance and promote recovery post-CVA    Time 4   Period Weeks   Status New     PT SHORT TERM GOAL #4   Title Patient to correctly and consistently perform appropriate HEP, to be updated weekly    Time 1   Period Weeks   Status New           PT Long Term Goals - 10/14/16 1223      PT  LONG TERM GOAL #1   Title Patient to show functional strength as having improved by 1 MMT grade in all tested muscles in order to improve R LE stabilty and overall balance/gait    Time 8   Period Weeks   Status New     PT LONG TERM GOAL #2   Title Patient to be able to ambulate 643ft during 3MWT without device and minimal fatigue in order to show improved mobility and community access    Time 8   Period Weeks   Status New     PT LONG TERM GOAL #3   Title Patient to score at least 20/24 on DGI in order to show improved dynamic balance and reduced overall fall risk    Time 8   Period Weeks   Status New     PT LONG TERM GOAL #4   Title Patient to be able to complete floor to stand transfer with Mod(I) in order to show independence in safely recovering in the event of a fall as well as improved functional strength    Time 8   Period Weeks   Status New     PT LONG TERM GOAL #5   Title Patient to be ambulating safely in community without assistive device and minimal unsteadiness in order to assist in returning to PLOF based activities    Time 8   Period Weeks   Status New               Plan - 11/03/16 9379    Clinical Impression Statement Patient arrives stating she is generally doing well, her biggest challenge right now is leg cramps in the bed. Continued functional strengthening and balance progression, with patient demonstrating overall good performance in general and appears to be recovering well from CVA moving forward. Continue to note more general weakness R LE however this is improving and patient is much more stable when ambulating without assistive device but will benefit from ongoing progressive functional challenge moving forward.     Rehab Potential Excellent   Clinical Impairments Affecting Rehab Potential (+) high PLOF, very high level of motivation to participate with PT, supportive family    PT Frequency 1x / week   PT Duration 8 weeks   PT  Treatment/Interventions ADLs/Self Care Home Management;Electrical Stimulation;DME Instruction;Gait training;Stair training;Functional mobility training;Therapeutic activities;Therapeutic exercise;Balance training;Neuromuscular re-education;Patient/family education;Orthotic Fit/Training;Manual techniques;Energy conservation;Taping   PT Next Visit Plan continue L before R neuro re-ed, general progression of funcitonal strength/balance. Gait with no device. DGI tasks    PT Home Exercise Plan Eval: tall kneeling with weight shift to R LE, reciprocal crawling forwards and backwards on bed, sit to stand with weight shift to R LE; 5/30: forward and lateral step ups, tandem stance, lateral stepping    Consulted and Agree with Plan of Care Patient      Patient will benefit from skilled therapeutic intervention in order to improve the following deficits and impairments:  Abnormal gait, Decreased coordination, Decreased mobility, Impaired tone, Decreased activity tolerance, Decreased strength, Decreased balance, Difficulty walking  Visit Diagnosis: Muscle weakness (generalized)  Unsteadiness on feet  Other abnormalities of gait and mobility  Difficulty in walking, not elsewhere classified     Problem List Patient Active Problem List   Diagnosis Date Noted  . Hypoglycemia   . Hypertriglyceridemia 10/11/2016  . Diarrhea due to malabsorption   . Benign essential HTN   . Transaminitis   . Acute ischemic stroke ([place])   . Hyperglycemia   . Chronic diastolic congestive heart failure ([place])   . Right hemiparesis ([place])   . Dysarthria, post-stroke   . Poorly controlled diabetes mellitus ([place])   . Dyslipidemia   . Mixed hyperlipidemia   . Basilar artery stenosis   . 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[place]) 10/01/2016  . DM (diabetes mellitus), type 2 with neurological complications ([place]) 29/51/8841  . Hot flashes 07/30/2013  . Hypertension 07/30/2013  . Perimenopausal symptoms 07/30/2013    Deniece Ree PT,  DPT [facility] [address] [place], [place], [address] Phone: [phone]   Fax:  [phone]  Name: LAKEIDRA RELIFORD MRN: [id number] Date of Birth: [birth year]

## 2016-11-10 ENCOUNTER — Encounter (HOSPITAL_COMMUNITY): Payer: Self-pay

## 2016-11-10 ENCOUNTER — Ambulatory Visit (HOSPITAL_COMMUNITY): Payer: BC Managed Care – PPO

## 2016-11-10 ENCOUNTER — Ambulatory Visit (HOSPITAL_COMMUNITY): Payer: BC Managed Care – PPO | Admitting: Physical Therapy

## 2016-11-10 DIAGNOSIS — M6281 Muscle weakness (generalized): Secondary | ICD-10-CM

## 2016-11-10 DIAGNOSIS — R2681 Unsteadiness on feet: Secondary | ICD-10-CM

## 2016-11-10 DIAGNOSIS — R278 Other lack of coordination: Secondary | ICD-10-CM

## 2016-11-10 DIAGNOSIS — R2689 Other abnormalities of gait and mobility: Secondary | ICD-10-CM

## 2016-11-10 DIAGNOSIS — R262 Difficulty in walking, not elsewhere classified: Secondary | ICD-10-CM

## 2016-11-10 NOTE — Patient Instructions (Signed)
[email or web address]

## 2016-11-10 NOTE — Therapy (Addendum)
[place], [place], [address] Phone: [phone]   Fax:  [phone]  Occupational Therapy Treatment  Patient Details  Name: Shelley Frazier MRN: [id number] Date of Birth: July 27, 1965 Referring Provider: Reesa Chew, PA  Encounter Date: 11/10/2016      OT End of Session - 11/10/16 1111    Visit Number 4   Number of Visits 6   Date for OT Re-Evaluation 11/25/16   Authorization Type [place] $25 co pay   OT Start Time [phone]   OT Stop Time 1033   OT Time Calculation (min) 44 min   Activity Tolerance Patient tolerated treatment well   Behavior During Therapy [facility] for tasks assessed/performed      Past Medical History:  Diagnosis Date  . Back pain   . Diabetes mellitus without complication ([place])   . Fatty liver   . Gallstones   . Hot flashes 07/30/2013  . Hypertension   . Obesity   . Perimenopausal symptoms 07/30/2013    Past Surgical History:  Procedure Laterality Date  . TONSILLECTOMY AND ADENOIDECTOMY    . TUBAL LIGATION      There were no vitals filed for this visit.      Subjective Assessment - 11/10/16 0959    Subjective  S: I'm not sure if i could do that before this happened (in relation to the BUE arm circles with theraband).    Currently in Pain? No/denies            [facility] OT Assessment - 11/10/16 1000      Assessment   Diagnosis right side weakness     Precautions   Precautions Fall   Precaution Comments Due to right side upper and lower extremity weakness                  OT Treatments/Exercises (OP) - 11/10/16 1000      Cognitive Exercises   Keyboarding Pt completed online typing tasks to assess how many words she could type in one minute, completed this twice     Exercises   Exercises Shoulder;Hand     Shoulder Exercises: ROM/Strengthening   "W" Arms 10 times each   X to V Arms 10 times each   Proximal Shoulder Strengthening, Seated 10 times each no  rest   Other ROM/Strengthening Exercises with red theraband circle patient kept left arm stationary and completed horizontal abduction, flexion, extension 10 times each attempted bilateral reciprocal movements, however patient is not able to move arms in unison      Hand Exercises   Theraputty Flatten;Roll;Grip;Pinch   Theraputty - Flatten red   Theraputty - Roll red   Theraputty - Grip red   Theraputty - Pinch red              OT Education - 11/10/16 1027    Education provided Yes   Education Details Discussed continuing practice of typing on keybaord at home to increase speed for functional use   Person(s) Educated Patient   Methods Explanation   Comprehension Verbalized understanding          OT Short Term Goals - 10/27/16 1106      OT SHORT TERM GOAL #1   Title Patient will be educated and independent with HEP to increase functional use of RUE as dominant with all tasks 75% of the time.    Time 6   Period Weeks   Status On-going     OT  SHORT TERM GOAL #2   Title Patient will increase grip strength by 20# and pinch strength by 10# to increase ability to hold onto items without dropping.    Time 6   Period Weeks   Status On-going     OT SHORT TERM GOAL #3   Title Patient will increase RUE strength in shoulder, elbow, and wrist to 4/5 to increase ability to return to cooking meals on a more regular basis.    Time 6   Period Weeks   Status On-going     OT SHORT TERM GOAL #4   Title Patient will increase fine motor coordination by completing 9 hole peg test in 45 seconds or less.    Time 6   Period Weeks   Status On-going     OT SHORT TERM GOAL #5   Title Patient will increase gross motor coordination by completing box and block test and transferring 30 blocks with right hand.    Time 6   Period Weeks   Status On-going                  Plan - 11/10/16 1139    Clinical Impression Statement A: Pt presented difficulty with BUE circular motions.  Required mod VC for form and technique with all theraband exercises. Theraputty used to increase grip and pinch strength, pt needed rest breaks during this activity. Completed online computer typing task to address functional work activity. Pt increased words per minute during second trial and therapist encouraged to continue this at home.   Plan P: Continue with theraband circles exercises and keyboarding tasks. Attempt coordination task with small pegboard and tweezers.      Patient will benefit from skilled therapeutic intervention in order to improve the following deficits and impairments:  Decreased strength, Decreased coordination, Impaired UE functional use, Decreased range of motion  Visit Diagnosis: Muscle weakness (generalized)  Other lack of coordination    Problem List Patient Active Problem List   Diagnosis Date Noted  . Hypoglycemia   . Hypertriglyceridemia 10/11/2016  . Diarrhea due to malabsorption   . Benign essential HTN   . Transaminitis   . Acute ischemic stroke ([place])   . Hyperglycemia   . Chronic diastolic congestive heart failure ([place])   . Right hemiparesis ([place])   . Dysarthria, post-stroke   . Poorly controlled diabetes mellitus ([place])   . Dyslipidemia   . Mixed hyperlipidemia   . Basilar artery stenosis   . 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[place]) 10/01/2016  . DM (diabetes mellitus), type 2 with neurological complications ([place]) 21/30/8657  . Hot flashes 07/30/2013  . Hypertension 07/30/2013  . Perimenopausal symptoms 07/30/2013    Luther Hearing, OT Student [phone] 11/10/2016, 11:54 AM  [place], [place], [address] Phone: [phone]   Fax:  [phone]  Name: KRISTIANNE ALBIN MRN: [id number] Date of Birth: March 21, 1966    Note reviewed by clinical instructor and accurately reflects treatment session.   Ailene Ravel, OTR/L,CBIS  [phone]

## 2016-11-10 NOTE — Therapy (Signed)
[place] [address] [place], [place], [address] Phone: [phone]   Fax:  [phone]  Physical Therapy Treatment (Re-Assessment)  Patient Details  Name: Shelley Frazier MRN: [id number] Date of Birth: 16-Aug-1965 Referring Provider: Reesa Chew PA-C   Encounter Date: 11/10/2016      PT End of Session - 11/10/16 0944    Visit Number 4   Number of Visits 9   Date for PT Re-Evaluation 12/08/16   Authorization Type [place] Time Period 10/14/16 to 12/14/16   PT Start Time 0900   PT Stop Time 0942   PT Time Calculation (min) 42 min   Equipment Utilized During Treatment Gait belt   Activity Tolerance Patient tolerated treatment well   Behavior During Therapy [facility] for tasks assessed/performed      Past Medical History:  Diagnosis Date  . Back pain   . Diabetes mellitus without complication ([place])   . Fatty liver   . Gallstones   . Hot flashes 07/30/2013  . Hypertension   . Obesity   . Perimenopausal symptoms 07/30/2013    Past Surgical History:  Procedure Laterality Date  . TONSILLECTOMY AND ADENOIDECTOMY    . TUBAL LIGATION      There were no vitals filed for this visit.      Subjective Assessment - 11/10/16 0903    Subjective Patient states she is feeling alright, mornings are always a little harder on her legs until they "wake up  a little bit". Stairs are getting easier for her, getting in and out of bathtub is easier as well as she can do this on her own now. She still feels like balance can be a challenge for her sometimes. She rates herself as a C right now with Balance being her largest concern right now.    Pertinent History L pons CVA, basilar artery stenosis, HTN, DM, back pain    Patient Stated Goals improved R LE strenght/speed, return to PLOF    Currently in Pain? Yes   Pain Score 3    Pain Location Leg   Pain Orientation Right   Pain Descriptors / Indicators Aching;Dull   Pain Type  Acute pain   Pain Radiating Towards generalized LE, usually happens in the morning and gets better during the day    Pain Onset More than a month ago   Pain Frequency Intermittent   Aggravating Factors  just mornings in general    Pain Relieving Factors moving/exercising through the day    Effect of Pain on Daily Activities none, just has to be more careful in the mornings             [facility] PT Assessment - 11/10/16 0001      Strength   Right Hip Flexion 3/5   Right Hip Extension 3-/5   Right Hip ABduction 3/5   Left Hip Flexion 4+/5   Left Hip Extension 3+/5   Left Hip ABduction 4+/5   Right Knee Flexion 3-/5   Right Knee Extension 4/5   Left Knee Flexion 4+/5   Left Knee Extension 5/5   Right Ankle Dorsiflexion 4-/5   Left Ankle Dorsiflexion 5/5     6 minute walk test results    Aerobic Endurance Distance Walked 562   Endurance additional comments 3MWT, no device      Dynamic Gait Index   Level Surface Mild Impairment   Change in Gait Speed Mild Impairment   Gait with  Horizontal Head Turns Mild Impairment   Gait with Vertical Head Turns Mild Impairment   Gait and Pivot Turn Mild Impairment   Step Over Obstacle Mild Impairment   Step Around Obstacles Mild Impairment   Steps Moderate Impairment   Total Score 15                          Balance Exercises - 11/10/16 0931      Balance Exercises: Standing   Tandem Stance Eyes open;Foam/compliant surface;3 reps;Other (comment)  UE bar raise x5; 3 rounds each LE    SLS Eyes open;30 secs;Modified;4 reps  body blade horizontal/vertical x2 each            PT Education - 11/10/16 0944    Education provided Yes   Education Details progress with skilled PT services, POC moving forward, HEP update    Person(s) Educated Patient   Methods Explanation;Handout   Comprehension Verbalized understanding;Returned demonstration          PT Short Term Goals - 11/10/16 0920      PT SHORT TERM GOAL #1    Title Patient to be able to complete functional sit to stand with even weightbearing/no offshift to L LE to show improved functional strength and proprioception post-CVA    Baseline 6/13- minimal offshift    Time 4   Period Weeks   Status Achieved     PT SHORT TERM GOAL #2   Title Patient to show improved gait pattern including reduced scissoring, improved R LE stance form and time, equal step lengths, increased gait speed and elimination of mild drift L/R during gait to show improved safety and efficiency of mobility    Baseline 6/13- improved but has not fully met goal    Time 4   Period Weeks   Status Partially Met     PT SHORT TERM GOAL #3   Title Patient to be participatory in regular walking program, at least 10-15 minutes continuously and 5 days per week, in order to address reduced functional activity tolerance and promote recovery post-CVA    Baseline 6/13 going well    Time 4   Period Weeks   Status Partially Met     PT SHORT TERM GOAL #4   Title Patient to correctly and consistently perform appropriate HEP, to be updated weekly    Baseline 6/13- compliant    Time 1   Period Weeks   Status Achieved           PT Long Term Goals - 11/10/16 1749      PT LONG TERM GOAL #1   Title Patient to show functional strength as having improved by 1 MMT grade in all tested muscles in order to improve R LE stabilty and overall balance/gait    Baseline 6/13- improving    Time 8   Period Weeks   Status On-going     PT LONG TERM GOAL #2   Title Patient to be able to ambulate 683f during 3MWT without device and minimal fatigue in order to show improved mobility and community access    Baseline 6/13- 5678f   Time 8   Period Weeks   Status On-going     PT LONG TERM GOAL #3   Title Patient to score at least 20/24 on DGI in order to show improved dynamic balance and reduced overall fall risk    Baseline 6/13- 15   Time 8   Period Weeks  Status On-going     PT LONG TERM  GOAL #4   Title Patient to be able to complete floor to stand transfer with Mod(I) in order to show independence in safely recovering in the event of a fall as well as improved functional strength    Baseline 6/13- have not worked on this at PT but patient reports she has done this on her own    Time 8   Period Weeks   Status On-going     PT LONG TERM GOAL #5   Title Patient to be ambulating safely in community without assistive device and minimal unsteadiness in order to assist in returning to PLOF based activities    Baseline 6/13- continues to use cane in community    Time 8   Period Weeks   Status On-going               Plan - 11/10/16 0944    Clinical Impression Statement Re-assessment performed today. Patient is progressing well with skilled PT services and shows improvement in all objective measures at this point; she does, however, continue to demonstrate general impairment in functional strength especially with R LE, dynamic balance, stair navigation, and with gait mechanics/speed. At this point strongly recommend continuation of skilled PT services to continue addressing functional deficits, to reduce fall risk, and assist in reaching overall optimal level of function moving forward.    Rehab Potential Excellent   Clinical Impairments Affecting Rehab Potential (+) high PLOF, very high level of motivation to participate with PT, supportive family    PT Frequency 1x / week   PT Duration 4 weeks   PT Treatment/Interventions ADLs/Self Care Home Management;Electrical Stimulation;DME Instruction;Gait training;Stair training;Functional mobility training;Therapeutic activities;Therapeutic exercise;Balance training;Neuromuscular re-education;Patient/family education;Orthotic Fit/Training;Manual techniques;Energy conservation;Taping   PT Next Visit Plan continue L before R strength, incresae focus on balance; gait trainer on TM    PT Home Exercise Plan Eval: tall kneeling with weight  shift to R LE, reciprocal crawling forwards and backwards on bed, sit to stand with weight shift to R LE; 5/30: forward and lateral step ups, tandem stance, lateral stepping; 6/12: SLS, tandem gait    Consulted and Agree with Plan of Care Patient      Patient will benefit from skilled therapeutic intervention in order to improve the following deficits and impairments:  Abnormal gait, Decreased coordination, Decreased mobility, Impaired tone, Decreased activity tolerance, Decreased strength, Decreased balance, Difficulty walking  Visit Diagnosis: Muscle weakness (generalized)  Unsteadiness on feet  Other abnormalities of gait and mobility  Difficulty in walking, not elsewhere classified     Problem List Patient Active Problem List   Diagnosis Date Noted  . Hypoglycemia   . Hypertriglyceridemia 10/11/2016  . Diarrhea due to malabsorption   . Benign essential HTN   . Transaminitis   . Acute ischemic stroke ([place])   . Hyperglycemia   . Chronic diastolic congestive heart failure ([place])   . Right hemiparesis ([place])   . Dysarthria, post-stroke   . Poorly controlled diabetes mellitus ([place])   . Dyslipidemia   . Mixed hyperlipidemia   . Basilar artery stenosis   . 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[place]) 10/01/2016  . DM (diabetes mellitus), type 2 with neurological complications ([place]) 48/54/6270  . Hot flashes 07/30/2013  . Hypertension 07/30/2013  . Perimenopausal symptoms 07/30/2013    Deniece Ree PT, DPT [place] [address] [place], [place], [address] Phone: [phone]   Fax:  [phone]  Name: Shelley Frazier MRN: [id number] Date of Birth: 1965-06-21

## 2016-11-10 NOTE — Patient Instructions (Signed)
   SINGLE LEG STANCE - SLS  Stand on one leg and maintain your balance.  Do this at your kitchen counter for safety.  Try to hold for 10-15 second or more.  Repeat 3 times each side, twice a day.     TANDEM WALK  Stand with one foot directly in front of the other so that the toes of one foot touches the heel of the other as shown in the image.  Do this exercise in a hallway or at your [place] counter for safety.  Repeat 3 lengths of counter/hallway, twice a day.

## 2016-11-17 ENCOUNTER — Ambulatory Visit (HOSPITAL_COMMUNITY): Payer: BC Managed Care – PPO | Admitting: Physical Therapy

## 2016-11-17 ENCOUNTER — Ambulatory Visit (HOSPITAL_COMMUNITY): Payer: BC Managed Care – PPO

## 2016-11-17 ENCOUNTER — Encounter (HOSPITAL_COMMUNITY): Payer: Self-pay

## 2016-11-17 DIAGNOSIS — R278 Other lack of coordination: Secondary | ICD-10-CM

## 2016-11-17 DIAGNOSIS — R2689 Other abnormalities of gait and mobility: Secondary | ICD-10-CM

## 2016-11-17 DIAGNOSIS — M6281 Muscle weakness (generalized): Secondary | ICD-10-CM

## 2016-11-17 DIAGNOSIS — R2681 Unsteadiness on feet: Secondary | ICD-10-CM

## 2016-11-17 DIAGNOSIS — R262 Difficulty in walking, not elsewhere classified: Secondary | ICD-10-CM

## 2016-11-17 NOTE — Therapy (Signed)
[place], [place], [address] Phone: [phone]   Fax:  [phone]  Physical Therapy Treatment  Patient Details  Name: Shelley Frazier MRN: [id number] Date of Birth: Oct 14, 1965 Referring Provider: Reesa Chew PA-C   Encounter Date: 11/17/2016      PT End of Session - 11/17/16 0948    Visit Number 5   Number of Visits 9   Date for PT Re-Evaluation 12/08/16   Authorization Type [place] Time Period 10/14/16 to 12/14/16   Authorization - Visit Number 5   Authorization - Number of Visits 9   PT Start Time 2707  Pt late for appointment   PT Stop Time 0944   PT Time Calculation (min) 30 min   Equipment Utilized During Treatment Gait belt   Activity Tolerance Patient tolerated treatment well   Behavior During Therapy [facility] for tasks assessed/performed      Past Medical History:  Diagnosis Date  . Back pain   . Diabetes mellitus without complication ([place])   . Fatty liver   . Gallstones   . Hot flashes 07/30/2013  . Hypertension   . Obesity   . Perimenopausal symptoms 07/30/2013    Past Surgical History:  Procedure Laterality Date  . TONSILLECTOMY AND ADENOIDECTOMY    . TUBAL LIGATION      There were no vitals filed for this visit.      Subjective Assessment - 11/17/16 0945    Subjective Pt states she just uses her cane when she is outside    Pertinent History L pons CVA, basilar artery stenosis, HTN, DM, back pain    Patient Stated Goals improved R LE strenght/speed, return to PLOF    Currently in Pain? No/denies   Pain Onset More than a month ago                Balance Exercises - 11/17/16 0945      Balance Exercises: Standing   Tandem Stance Eyes open;5 reps  head turns and nods    SLS with Vectors Solid surface;3 reps;10 secs    Sit to stand 10 x with Lt in back; 10 x with right in back Lunging x 10 Lt; RT    Balance Beam tandem gait x 2 rt    Retro Gait  2 reps   Sidestepping 2 reps;Theraband   Marching Limitations 10   Heel Raises Limitations 10   Mat exercises --  quadriped x alternate arm leg raise x 5             PT Short Term Goals - 11/10/16 0920      PT SHORT TERM GOAL #1   Title Patient to be able to complete functional sit to stand with even weightbearing/no offshift to L LE to show improved functional strength and proprioception post-CVA    Baseline 6/13- minimal offshift    Time 4   Period Weeks   Status Achieved     PT SHORT TERM GOAL #2   Title Patient to show improved gait pattern including reduced scissoring, improved R LE stance form and time, equal step lengths, increased gait speed and elimination of mild drift L/R during gait to show improved safety and efficiency of mobility    Baseline 6/13- improved but has not fully met goal    Time 4   Period Weeks   Status Partially Met     PT SHORT TERM GOAL #3  Title Patient to be participatory in regular walking program, at least 10-15 minutes continuously and 5 days per week, in order to address reduced functional activity tolerance and promote recovery post-CVA    Baseline 6/13 going well    Time 4   Period Weeks   Status Partially Met     PT SHORT TERM GOAL #4   Title Patient to correctly and consistently perform appropriate HEP, to be updated weekly    Baseline 6/13- compliant    Time 1   Period Weeks   Status Achieved           PT Long Term Goals - 11/10/16 9485      PT LONG TERM GOAL #1   Title Patient to show functional strength as having improved by 1 MMT grade in all tested muscles in order to improve R LE stabilty and overall balance/gait    Baseline 6/13- improving    Time 8   Period Weeks   Status On-going     PT LONG TERM GOAL #2   Title Patient to be able to ambulate 670f during 3MWT without device and minimal fatigue in order to show improved mobility and community access    Baseline 6/13- 5635f   Time 8   Period Weeks    Status On-going     PT LONG TERM GOAL #3   Title Patient to score at least 20/24 on DGI in order to show improved dynamic balance and reduced overall fall risk    Baseline 6/13- 15   Time 8   Period Weeks   Status On-going     PT LONG TERM GOAL #4   Title Patient to be able to complete floor to stand transfer with Mod(I) in order to show independence in safely recovering in the event of a fall as well as improved functional strength    Baseline 6/13- [place] has done this on her own    Time 8   Period Weeks   Status On-going     PT LONG TERM GOAL #5   Title Patient to be ambulating safely in community without assistive device and minimal unsteadiness in order to assist in returning to PLOF based activities    Baseline 6/13- continues to use cane in community    Time 8   Period Weeks   Status On-going               Plan - 11/17/16 1116    Clinical Impression Statement Pt did well with all activity needing only minimal verbal cuing for proper technique and form.  Added foam for balance as well as quadriped activity for coordination and balance.    Rehab Potential Excellent   Clinical Impairments Affecting Rehab Potential (+) high PLOF, very high level of motivation to participate with PT, supportive family    PT Frequency 1x / week   PT Duration 4 weeks   PT Treatment/Interventions ADLs/Self Care Home Management;Electrical Stimulation;DME Instruction;Gait training;Stair training;Functional mobility training;Therapeutic activities;Therapeutic exercise;Balance training;Neuromuscular re-education;Patient/family education;Orthotic Fit/Training;Manual techniques;Energy conservation;Taping   PT Next Visit Plan begin hurdles; cone rotation, steps    PT Home Exercise Plan Eval: tall kneeling with weight shift to R LE, reciprocal crawling forwards and backwards on bed, sit to stand with weight shift to R LE; 5/30: forward and lateral step  ups, tandem stance, lateral stepping; 6/12: SLS, tandem gait    Consulted and Agree with Plan of Care Patient  Patient will benefit from skilled therapeutic intervention in order to improve the following deficits and impairments:  Abnormal gait, Decreased coordination, Decreased mobility, Impaired tone, Decreased activity tolerance, Decreased strength, Decreased balance, Difficulty walking  Visit Diagnosis: Muscle weakness (generalized)  Other lack of coordination  Unsteadiness on feet  Difficulty in walking, not elsewhere classified  Other abnormalities of gait and mobility     Problem List Patient Active Problem List   Diagnosis Date Noted  . Hypoglycemia   . Hypertriglyceridemia 10/11/2016  . Diarrhea due to malabsorption   . Benign essential HTN   . Transaminitis   . Acute ischemic stroke ([place])   . Hyperglycemia   . Chronic diastolic congestive heart failure ([place])   . Right hemiparesis ([place])   . Dysarthria, post-stroke   . Poorly controlled diabetes mellitus ([place])   . Dyslipidemia   . Mixed hyperlipidemia   . Basilar artery stenosis   . 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[place]) 10/01/2016  . DM (diabetes mellitus), type 2 with neurological complications ([place]) 39/58/4417  . Hot flashes 07/30/2013  . Hypertension 07/30/2013  . Perimenopausal symptoms 07/30/2013   Rayetta Humphrey, PT CLT [phone] 11/17/2016, 11:18 AM  [place] [address] [place], [place], [address] Phone: [phone]   Fax:  [phone]  Name: Shelley Frazier MRN: [id number] Date of Birth: 05-22-1966

## 2016-11-17 NOTE — Therapy (Signed)
Shelley Frazier, [place], [address] Phone: [phone]   Fax:  [phone]  Occupational Therapy Treatment  Patient Details  Name: Shelley Frazier MRN: [id number] Date of Birth: 1966-05-27 Referring Provider: Reesa Chew, PA  Encounter Date: 11/17/2016      OT End of Session - 11/17/16 1217    Visit Number 5   Number of Visits 6   Date for OT Re-Evaluation 11/25/16   Authorization Type [place] $25 co pay   OT Start Time 0945   OT Stop Time 1030   OT Time Calculation (min) 45 min   Activity Tolerance Patient tolerated treatment well   Behavior During Therapy [facility] for tasks assessed/performed      Past Medical History:  Diagnosis Date  . Back pain   . Diabetes mellitus without complication ([place])   . Fatty liver   . Gallstones   . Hot flashes 07/30/2013  . Hypertension   . Obesity   . Perimenopausal symptoms 07/30/2013    Past Surgical History:  Procedure Laterality Date  . TONSILLECTOMY AND ADENOIDECTOMY    . TUBAL LIGATION      There were no vitals filed for this visit.      Subjective Assessment - 11/17/16 0930    Subjective  S: We had a death in the family so I was not able to practice my typing at home.   Currently in Pain? No/denies            [facility] OT Assessment - 11/17/16 0948      Assessment   Diagnosis right side weakness     Precautions   Precautions Fall   Precaution Comments Due to right side upper and lower extremity weakness                  OT Treatments/Exercises (OP) - 11/17/16 0948      Cognitive Exercises   Keyboarding Pt completed online typing tasks to assess how many words she could type in one minute, completed this twice     Exercises   Exercises Shoulder;Hand     Shoulder Exercises: ROM/Strengthening   "W" Arms 12 times   X to V Arms 12 times   Proximal Shoulder Strengthening, Seated 10 times each no rest   Other ROM/Strengthening  Exercises holding green therapy ball completed chest press, overhead press, flexion, elbow flexion 10 times each   Other ROM/Strengthening Exercises with red theraband circle patient kept left arm stationary and completed horizontal abduction, flexion, extension 10 times each attempted bilateral reciprocal movements, however patient is not able to move arms in unison      Hand Exercises   Hand Gripper with Large Beads all beads gripper 18#   Hand Gripper with Medium Beads all beads gripper 18#   Hand Gripper with Small Beads all beads gripper set at 15#   Small Pegboard Pt completed small pegboard design, placing pegs in pegboard one at a time with tweezers. Pt removed pegs and held as many as possible in palm while removing. Pt able to hold approximately 30 and then 40 pegs before placing in container.              OT Education - 11/17/16 1220    Education provided Yes   Education Details Discussed continuing practice of typing on keyboard at home to increase speed for functional use   Person(s) Educated Patient   Methods Explanation;Demonstration   Comprehension Verbalized understanding;Returned demonstration  OT Short Term Goals - 10/27/16 1106      OT SHORT TERM GOAL #1   Title Patient will be educated and independent with HEP to increase functional use of RUE as dominant with all tasks 75% of the time.    Time 6   Period Weeks   Status On-going     OT SHORT TERM GOAL #2   Title Patient will increase grip strength by 20# and pinch strength by 10# to increase ability to hold onto items without dropping.    Time 6   Period Weeks   Status On-going     OT SHORT TERM GOAL #3   Title Patient will increase RUE strength in shoulder, elbow, and wrist to 4/5 to increase ability to return to cooking meals on a more regular basis.    Time 6   Period Weeks   Status On-going     OT SHORT TERM GOAL #4   Title Patient will increase fine motor coordination by completing 9  hole peg test in 45 seconds or less.    Time 6   Period Weeks   Status On-going     OT SHORT TERM GOAL #5   Title Patient will increase gross motor coordination by completing box and block test and transferring 30 blocks with right hand.    Time 6   Period Weeks   Status On-going                  Plan - 11/17/16 1221    Clinical Impression Statement A: Pt presented with increased coordination in BUE during shoulder strengthening exercises. Completed small pegboard using tweezers with min difficulty. Pt required VC for assistance with hand gripper activity. Small beads presented as mod difficulty.  Pt increased accuracy of words on typing activity.   Plan P: Follow up on keyboarding practice at home. Complete shoulder strengthening with theraband at tower.      Patient will benefit from skilled therapeutic intervention in order to improve the following deficits and impairments:  Decreased strength, Decreased coordination, Impaired UE functional use, Decreased range of motion  Visit Diagnosis: Muscle weakness (generalized)  Other lack of coordination    Problem List Patient Active Problem List   Diagnosis Date Noted  . Hypoglycemia   . Hypertriglyceridemia 10/11/2016  . Diarrhea due to malabsorption   . Benign essential HTN   . Transaminitis   . Acute ischemic stroke ([place])   . Hyperglycemia   . Chronic diastolic congestive heart failure ([place])   . Right hemiparesis ([place])   . Dysarthria, post-stroke   . Poorly controlled diabetes mellitus ([place])   . Dyslipidemia   . Mixed hyperlipidemia   . Basilar artery stenosis   . 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[place]) 10/01/2016  . DM (diabetes mellitus), type 2 with neurological complications ([place]) 48/27/0786  . Hot flashes 07/30/2013  . Hypertension 07/30/2013  . Perimenopausal symptoms 07/30/2013    Luther Hearing, OT Student [phone] 11/17/2016, 12:36 PM  [place], [place], [address] Phone: [phone]   Fax:  [phone]  Name: Shelley Frazier MRN: [id number] Date of Birth: 27-Mar-1966   Note reviewed by clinical instructor and accurately reflects treatment session.   Ailene Ravel, OTR/L,CBIS  [phone]

## 2016-11-24 ENCOUNTER — Ambulatory Visit (HOSPITAL_COMMUNITY): Payer: BC Managed Care – PPO | Admitting: Physical Therapy

## 2016-11-24 ENCOUNTER — Ambulatory Visit (HOSPITAL_COMMUNITY): Payer: BC Managed Care – PPO | Admitting: Specialist

## 2016-11-24 ENCOUNTER — Encounter (HOSPITAL_COMMUNITY): Payer: Self-pay | Admitting: Specialist

## 2016-11-24 DIAGNOSIS — R29898 Other symptoms and signs involving the musculoskeletal system: Secondary | ICD-10-CM

## 2016-11-24 DIAGNOSIS — R262 Difficulty in walking, not elsewhere classified: Secondary | ICD-10-CM

## 2016-11-24 DIAGNOSIS — R278 Other lack of coordination: Secondary | ICD-10-CM

## 2016-11-24 DIAGNOSIS — M6281 Muscle weakness (generalized): Secondary | ICD-10-CM | POA: Diagnosis not present

## 2016-11-24 DIAGNOSIS — R2681 Unsteadiness on feet: Secondary | ICD-10-CM

## 2016-11-24 DIAGNOSIS — R2689 Other abnormalities of gait and mobility: Secondary | ICD-10-CM

## 2016-11-24 NOTE — Addendum Note (Signed)
Addended by: Ailene Ravel D on: 11/24/2016 03:28 PM   Modules accepted: Orders

## 2016-11-24 NOTE — Therapy (Signed)
[place], [place], [address] Phone: [phone]   Fax:  [phone]  Physical Therapy Treatment  Patient Details  Name: Shelley Frazier MRN: [id number] Date of Birth: Feb 09, 1966 Referring Provider: Reesa Chew PA-C   Encounter Date: 11/24/2016      PT End of Session - 11/24/16 0943    Visit Number 6   Number of Visits 9   Date for PT Re-Evaluation 12/08/16   Authorization Type [place] Time Period 10/14/16 to 12/14/16   Authorization - Visit Number 6   Authorization - Number of Visits 9   PT Start Time 0902   PT Stop Time 0942   PT Time Calculation (min) 40 min   Equipment Utilized During Treatment Gait belt   Activity Tolerance Patient tolerated treatment well   Behavior During Therapy [facility] for tasks assessed/performed      Past Medical History:  Diagnosis Date  . Back pain   . Diabetes mellitus without complication ([place])   . Fatty liver   . Gallstones   . Hot flashes 07/30/2013  . Hypertension   . Obesity   . Perimenopausal symptoms 07/30/2013    Past Surgical History:  Procedure Laterality Date  . TONSILLECTOMY AND ADENOIDECTOMY    . TUBAL LIGATION      There were no vitals filed for this visit.      Subjective Assessment - 11/24/16 0908    Subjective Patient arrives stating she is doing well, just fatigued and no pain    Pertinent History L pons CVA, basilar artery stenosis, HTN, DM, back pain    Patient Stated Goals improved R LE strenght/speed, return to PLOF    Currently in Pain? No/denies                         [facility] Adult PT Treatment/Exercise - 11/24/16 0001      Knee/Hip Exercises: Standing   Forward Step Up Both;1 set;15 reps   Forward Step Up Limitations 6 inch box    Gait Training gait trainer on TM x6 minutes, cues for performance and S for safety due to it being first time on machine    Other Standing Knee Exercises kneel to stand  2x5; quadruped to stand intermittent use of UE 2x4 min guard              Balance Exercises - 11/24/16 0925      Balance Exercises: Standing   Stepping Strategy Anterior;Posterior;5 reps  min guard in bars    Rockerboard Anterior/posterior;Lateral;EO;Intermittent UE support;Other (comment)  2 min AP and lateral    Other Standing Exercises layered cone taps combo of 5 min guard            PT Education - 11/24/16 0943    Education provided Yes   Education Details early re-assess/possible DC next session    Person(s) Educated Patient   Methods Explanation   Comprehension Verbalized understanding          PT Short Term Goals - 11/10/16 0920      PT SHORT TERM GOAL #1   Title Patient to be able to complete functional sit to stand with even weightbearing/no offshift to L LE to show improved functional strength and proprioception post-CVA    Baseline 6/13- minimal offshift    Time 4   Period Weeks   Status Achieved     PT SHORT TERM GOAL #2  Title Patient to show improved gait pattern including reduced scissoring, improved R LE stance form and time, equal step lengths, increased gait speed and elimination of mild drift L/R during gait to show improved safety and efficiency of mobility    Baseline 6/13- improved but has not fully met goal    Time 4   Period Weeks   Status Partially Met     PT SHORT TERM GOAL #3   Title Patient to be participatory in regular walking program, at least 10-15 minutes continuously and 5 days per week, in order to address reduced functional activity tolerance and promote recovery post-CVA    Baseline 6/13 going well    Time 4   Period Weeks   Status Partially Met     PT SHORT TERM GOAL #4   Title Patient to correctly and consistently perform appropriate HEP, to be updated weekly    Baseline 6/13- compliant    Time 1   Period Weeks   Status Achieved           PT Long Term Goals - 11/10/16 5329      PT LONG TERM GOAL #1    Title Patient to show functional strength as having improved by 1 MMT grade in all tested muscles in order to improve R LE stabilty and overall balance/gait    Baseline 6/13- improving    Time 8   Period Weeks   Status On-going     PT LONG TERM GOAL #2   Title Patient to be able to ambulate 620f during 3MWT without device and minimal fatigue in order to show improved mobility and community access    Baseline 6/13- 5651f   Time 8   Period Weeks   Status On-going     PT LONG TERM GOAL #3   Title Patient to score at least 20/24 on DGI in order to show improved dynamic balance and reduced overall fall risk    Baseline 6/13- 15   Time 8   Period Weeks   Status On-going     PT LONG TERM GOAL #4   Title Patient to be able to complete floor to stand transfer with Mod(I) in order to show independence in safely recovering in the event of a fall as well as improved functional strength    Baseline 6/13- have not worked on this at PT but patient reports she has done this on her own    Time 8   Period Weeks   Status On-going     PT LONG TERM GOAL #5   Title Patient to be ambulating safely in community without assistive device and minimal unsteadiness in order to assist in returning to PLOF based activities    Baseline 6/13- continues to use cane in community    Time 8   Period Weeks   Status On-going               Plan - 11/24/16 0944    Clinical Impression Statement Patient arrives reporting she is doing well today. Introduced gaAdministrator, artsetting on TM with cues for performance and S for safety due to this being patient's first time on machine; noted mild difficulty in symmetrical gait mechanics when dual tasking however overall fairly good performance. Also introduced kneel to stand at high-low table/on mat on floor and transitioned to floor to stand training this session as well. Patient continues to do well with skilled PT services moving forward; recommend early re-assess and  possible DC next  session.    Rehab Potential Excellent   Clinical Impairments Affecting Rehab Potential (+) high PLOF, very high level of motivation to participate with PT, supportive family    PT Frequency 1x / week   PT Duration 4 weeks   PT Treatment/Interventions ADLs/Self Care Home Management;Electrical Stimulation;DME Instruction;Gait training;Stair training;Functional mobility training;Therapeutic activities;Therapeutic exercise;Balance training;Neuromuscular re-education;Patient/family education;Orthotic Fit/Training;Manual techniques;Energy conservation;Taping   PT Next Visit Plan re-assess, possible early DC    PT Home Exercise Plan Eval: tall kneeling with weight shift to R LE, reciprocal crawling forwards and backwards on bed, sit to stand with weight shift to R LE; 5/30: forward and lateral step ups, tandem stance, lateral stepping; 6/12: SLS, tandem gait    Consulted and Agree with Plan of Care Patient      Patient will benefit from skilled therapeutic intervention in order to improve the following deficits and impairments:  Abnormal gait, Decreased coordination, Decreased mobility, Impaired tone, Decreased activity tolerance, Decreased strength, Decreased balance, Difficulty walking  Visit Diagnosis: Muscle weakness (generalized)  Other lack of coordination  Unsteadiness on feet  Difficulty in walking, not elsewhere classified  Other abnormalities of gait and mobility     Problem List Patient Active Problem List   Diagnosis Date Noted  . Hypoglycemia   . Hypertriglyceridemia 10/11/2016  . Diarrhea due to malabsorption   . Benign essential HTN   . Transaminitis   . Acute ischemic stroke ([place])   . Hyperglycemia   . Chronic diastolic congestive heart failure ([place])   . Right hemiparesis ([place])   . Dysarthria, post-stroke   . Poorly controlled diabetes mellitus ([place])   . Dyslipidemia   . Mixed hyperlipidemia   . Basilar artery stenosis   . Left pontine stroke  ([place]) 10/01/2016  . DM (diabetes mellitus), type 2 with neurological complications ([place]) 75/19/8242  . Hot flashes 07/30/2013  . Hypertension 07/30/2013  . Perimenopausal symptoms 07/30/2013    Deniece Ree PT, DPT [employer] [address] [place], [place], [address] Phone: [phone]   Fax:  [phone]  Name: Shelley Frazier MRN: [id number] Date of Birth: 03/26/1966

## 2016-11-24 NOTE — Therapy (Signed)
[place], [place], [address] Phone: [phone]   Fax:  [phone]  Occupational Therapy Treatment  Patient Details  Name: Shelley Frazier MRN: [id number] Date of Birth: 11-01-1965 Referring Provider: Reesa Chew, PA  Encounter Date: 11/24/2016      OT End of Session - 11/24/16 1054    Visit Number 6   Number of Visits 6   Date for OT Re-Evaluation 11/25/16   Authorization Type [place] $25 co pay   OT Start Time [phone]   OT Stop Time 1034   OT Time Calculation (min) 44 min   Activity Tolerance Patient tolerated treatment well   Behavior During Therapy [facility] for tasks assessed/performed      Past Medical History:  Diagnosis Date  . Back pain   . Diabetes mellitus without complication ([place])   . Fatty liver   . Gallstones   . Hot flashes 07/30/2013  . Hypertension   . Obesity   . Perimenopausal symptoms 07/30/2013    Past Surgical History:  Procedure Laterality Date  . TONSILLECTOMY AND ADENOIDECTOMY    . TUBAL LIGATION      There were no vitals filed for this visit.      Subjective Assessment - 11/24/16 1054    Subjective  S:  The last time I did the arm bike I was in the hospital and I couldnt hold the handle.  they had to strap my hand to it.    Currently in Pain? No/denies            [facility] OT Assessment - 11/24/16 1057      Assessment   Diagnosis right side weakness     Precautions   Precautions Fall   Precaution Comments Due to right side upper and lower extremity weakness                  OT Treatments/Exercises (OP) - 11/24/16 1057      Exercises   Exercises Shoulder;Hand     Shoulder Exercises: Standing   Extension Theraband;10 reps   Theraband Level (Shoulder Extension) Level 2 (Red)   Row Theraband;10 reps   Theraband Level (Shoulder Row) Level 2 (Red)   Retraction Theraband;10 reps   Theraband Level (Shoulder Retraction) Level 2 (Red)     Shoulder Exercises: ROM/Strengthening   Rebounder 3 minutes forward and 1 minute reverse, patient comfortable at 1.0-1.5 speed, therapist coached patient to maintain 1.5-2.0 speed for minutes 2 and 3   Other ROM/Strengthening Exercises pinch tree, able to place red, yellow, green, blue clothespins onto pinch tree with good gross motor coordination, able to reach to 2 inches from top of vertical pole.       Hand Exercises   Theraputty Flatten;Roll;Grip;Pinch   Theraputty - Flatten red in standing, pvc pipe wrist stability exercise X 5 repetitions   Theraputty - Roll red seated   Theraputty - Grip red supinated and pronated grip   Theraputty - Locate Pegs located 10 beads with verbal guidance to increase use of right hand with activity   Hand Gripper with Large Beads black triangle hand gripper, able to pick up 6 large beads individually with max difficulty maintaining grip on beads while transferring to the bucket of beads              Balance Exercises - 11/24/16 0925      Balance Exercises: Standing   Stepping Strategy Anterior;Posterior;5 reps  min guard in bars  Rockerboard Anterior/posterior;Lateral;EO;Intermittent UE support;Other (comment)  2 min AP and lateral    Other Standing Exercises layered cone taps combo of 5 min guard              OT Short Term Goals - 10/27/16 1106      OT SHORT TERM GOAL #1   Title Patient will be educated and independent with HEP to increase functional use of RUE as dominant with all tasks 75% of the time.    Time 6   Period Weeks   Status On-going     OT SHORT TERM GOAL #2   Title Patient will increase grip strength by 20# and pinch strength by 10# to increase ability to hold onto items without dropping.    Time 6   Period Weeks   Status On-going     OT SHORT TERM GOAL #3   Title Patient will increase RUE strength in shoulder, elbow, and wrist to 4/5 to increase ability to return to cooking meals on a more regular basis.    Time  6   Period Weeks   Status On-going     OT SHORT TERM GOAL #4   Title Patient will increase fine motor coordination by completing 9 hole peg test in 45 seconds or less.    Time 6   Period Weeks   Status On-going     OT SHORT TERM GOAL #5   Title Patient will increase gross motor coordination by completing box and block test and transferring 30 blocks with right hand.    Time 6   Period Weeks   Status On-going                  Plan - 11/24/16 1055    Clinical Impression Statement A:  Patient demonstrating improved GMC when reaching for pinch tree clothes pins with shoulder flexion.  Patient had minimal fatigue with arm bike speed exercise.    Plan P:  Reassess.  Focus on grip strengthening and in hand coordination       Patient will benefit from skilled therapeutic intervention in order to improve the following deficits and impairments:  Decreased strength, Decreased coordination, Impaired UE functional use, Decreased range of motion  Visit Diagnosis: Other lack of coordination  Other symptoms and signs involving the musculoskeletal system    Problem List Patient Active Problem List   Diagnosis Date Noted  . Hypoglycemia   . Hypertriglyceridemia 10/11/2016  . Diarrhea due to malabsorption   . Benign essential HTN   . Transaminitis   . Acute ischemic stroke ([place])   . Hyperglycemia   . Chronic diastolic congestive heart failure ([place])   . Right hemiparesis ([place])   . Dysarthria, post-stroke   . Poorly controlled diabetes mellitus ([place])   . Dyslipidemia   . Mixed hyperlipidemia   . Basilar artery stenosis   . 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[place]) 10/01/2016  . DM (diabetes mellitus), type 2 with neurological complications ([place]) 89/38/1017  . Hot flashes 07/30/2013  . Hypertension 07/30/2013  . Perimenopausal symptoms 07/30/2013    Vangie Bicker, [place], OTR/L [phone]  11/24/2016, 11:02 AM  [place], [place], [address] Phone: [phone]   Fax:  [phone]  Name: Shelley Frazier MRN: [id number] Date of Birth: [birth year]

## 2016-11-30 ENCOUNTER — Ambulatory Visit (HOSPITAL_COMMUNITY): Payer: BC Managed Care – PPO | Attending: Physical Medicine and Rehabilitation | Admitting: Physical Therapy

## 2016-11-30 ENCOUNTER — Ambulatory Visit (HOSPITAL_COMMUNITY): Payer: BC Managed Care – PPO

## 2016-11-30 ENCOUNTER — Encounter (HOSPITAL_COMMUNITY): Payer: Self-pay

## 2016-11-30 DIAGNOSIS — R262 Difficulty in walking, not elsewhere classified: Secondary | ICD-10-CM | POA: Diagnosis present

## 2016-11-30 DIAGNOSIS — R278 Other lack of coordination: Secondary | ICD-10-CM | POA: Insufficient documentation

## 2016-11-30 DIAGNOSIS — R2689 Other abnormalities of gait and mobility: Secondary | ICD-10-CM | POA: Diagnosis present

## 2016-11-30 DIAGNOSIS — R29898 Other symptoms and signs involving the musculoskeletal system: Secondary | ICD-10-CM

## 2016-11-30 DIAGNOSIS — R2681 Unsteadiness on feet: Secondary | ICD-10-CM | POA: Diagnosis present

## 2016-11-30 DIAGNOSIS — M6281 Muscle weakness (generalized): Secondary | ICD-10-CM | POA: Diagnosis present

## 2016-11-30 NOTE — Patient Instructions (Signed)
Home Exercises Program Theraputty Exercises  Do the following exercises 1-3 times a day using your affected hand.  1. Roll putty into a ball.  2. Make into a pancake.  3. Roll putty into a roll.  4. Pinch along log with first finger and thumb.   5. Make into a ball.  6. Roll it back into a log.   7. Pinch using thumb and side of first finger.  8. Roll into a ball, then flatten into a pancake.  9. Using your fingers, make putty into a mountain.  

## 2016-11-30 NOTE — Patient Instructions (Signed)
   HIP ABDUCTION - SIDELYING  While lying on your side, slowly raise up your top leg to the side. Keep your knee straight and maintain your toes pointed forward the entire time. Keep your leg in-line with your body.  The bottom leg can be bent to stabilize your body.  Make sure your hips do not roll or move while you are doing this.   Repeat 10-15 times each leg, twice a day.    PRONE HIP EXTENSION - BENT  While lying face down with your knee bent, slowly raise up your knee off the ground.  Do not roll your hips or use your back muscles for this exercise.  Repeat 10-15 times each leg, twice a day.

## 2016-11-30 NOTE — Therapy (Signed)
[place], [place], [address] Phone: [phone]   Fax:  [phone]  Occupational Therapy Treatment  Patient Details  Name: CHENOAH MCNALLY MRN: [id number] Date of Birth: [birth year] Referring Provider: Reesa Chew, PA  Encounter Date: 11/30/2016      OT End of Session - 11/30/16 1107    Visit Number 7   Number of Visits 7   Date for OT Re-Evaluation 11/25/16   Authorization Type [place] $25 co pay   OT Start Time [phone]   OT Stop Time 1028   OT Time Calculation (min) 42 min   Activity Tolerance Patient tolerated treatment well   Behavior During Therapy [facility] for tasks assessed/performed      Past Medical History:  Diagnosis Date  . Back pain   . Diabetes mellitus without complication ([place])   . Fatty liver   . Gallstones   . Hot flashes 07/30/2013  . Hypertension   . Obesity   . Perimenopausal symptoms 07/30/2013    Past Surgical History:  Procedure Laterality Date  . TONSILLECTOMY AND ADENOIDECTOMY    . TUBAL LIGATION      There were no vitals filed for this visit.      Subjective Assessment - 11/30/16 1010    Subjective  S: I have putty at home that I have been using, but I have not been typing.   Currently in Pain? No/denies            [facility] OT Assessment - 11/30/16 0947      Assessment   Diagnosis right side weakness     Precautions   Precautions Fall   Precaution Comments Due to right side upper and lower extremity weakness     Coordination   9 Hole Peg Test Right;Left   Right 9 Hole Peg Test 25.89 seconds  previous: 54.9 seconds   Left 9 Hole Peg Test 20.05 seconds  previous: 50.1   Box and Blocks R: 54 , L: 65  Previous: R: 36, L: 60     ROM / Strength   AROM / PROM / Strength AROM;PROM;Strength     Strength   Overall Strength Within functional limits for tasks performed   Strength Assessment Site Shoulder;Hand   Right/Left Shoulder Right   Right Shoulder  Flexion 5/5  previous: 3+/5   Right Shoulder ABduction 5/5  previous: 3+/5   Right Shoulder Internal Rotation 4+/5  previous: 3/5   Right Shoulder External Rotation 4+/5  previous: 3/5   Right/Left Elbow Right   Right Elbow Flexion 4+/5  previous: 3+/5   Right Elbow Extension 4+/5  previous: 3+/5   Right/Left Forearm Right   Right Forearm Pronation 5/5  previous: 3/5   Right Forearm Supination 4+/5  previous: 3/5   Right/Left Wrist Right   Right Wrist Flexion 4+/5  previous: 3+/5   Right Wrist Extension 5/5  previous: 3+/5   Right/Left hand Right   Right Hand Grip (lbs) 38  previous: 15   Right Hand Lateral Pinch 11 lbs  previous: 6   Right Hand 3 Point Pinch 12 lbs  previous: 5                  OT Treatments/Exercises (OP) - 11/30/16 1016      Exercises   Exercises Hand     Hand Exercises   Theraputty Flatten;Roll;Grip;Pinch;Locate Pegs   Theraputty - Flatten red   Theraputty - Roll red   Theraputty -  Grip red   Theraputty - Pinch red   Theraputty - Locate Pegs red                OT Education - 11/30/16 1011    Education provided Yes   Education Details educated pt on all goals that were met through reassessment, encouraged pt to continue HEP at home, discussed D/C   Person(s) Educated Patient   Methods Explanation   Comprehension Verbalized understanding          OT Short Term Goals - 11/30/16 1003      OT SHORT TERM GOAL #1   Title Patient will be educated and independent with HEP to increase functional use of RUE as dominant with all tasks 75% of the time.    Time 6   Period Weeks   Status Achieved     OT SHORT TERM GOAL #2   Title Patient will increase grip strength by 20# and pinch strength by 10# to increase ability to hold onto items without dropping.    Time 6   Period Weeks   Status Partially Met     OT SHORT TERM GOAL #3   Title Patient will increase RUE strength in shoulder, elbow, and wrist to 4/5 to increase  ability to return to cooking meals on a more regular basis.    Time 6   Period Weeks   Status Achieved     OT SHORT TERM GOAL #4   Title Patient will increase fine motor coordination by completing 9 hole peg test in 45 seconds or less.    Time 6   Period Weeks   Status Achieved     OT SHORT TERM GOAL #5   Title Patient will increase gross motor coordination by completing box and block test and transferring 30 blocks with right hand.    Time 6   Period Weeks   Status Achieved                  Plan - 11/30/16 1230    Clinical Impression Statement A: Session focused on reassessment. Pt achieved four short term goals and partially met one. Progress has been made in grip strength, RUE strength and coordination. Completed theraputty exercises to increase pinch strength with VC needed for form and technique. Pt was encouraged to continue with HEP targeting pinch strength.  Pt D/C after today's session.   Plan P: D/C with HEP.      Patient will benefit from skilled therapeutic intervention in order to improve the following deficits and impairments:  Decreased strength, Decreased coordination, Impaired UE functional use, Decreased range of motion  Visit Diagnosis: Other lack of coordination  Other symptoms and signs involving the musculoskeletal system    Problem List Patient Active Problem List   Diagnosis Date Noted  . Hypoglycemia   . Hypertriglyceridemia 10/11/2016  . Diarrhea due to malabsorption   . Benign essential HTN   . Transaminitis   . Acute ischemic stroke ([place])   . Hyperglycemia   . Chronic diastolic congestive heart failure ([place])   . Right hemiparesis ([place])   . Dysarthria, post-stroke   . Poorly controlled diabetes mellitus ([place])   . Dyslipidemia   . Mixed hyperlipidemia   . Basilar artery stenosis   . 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[place]) 10/01/2016  . DM (diabetes mellitus), type 2 with neurological complications ([place]) 03/50/0938  . Hot flashes 07/30/2013   . Hypertension 07/30/2013  . Perimenopausal symptoms 07/30/2013    Luther Hearing, OT  Student [phone] 11/30/2016, 12:34 PM  [place], [place], [address] Phone: [phone]   Fax:  [phone]  Name: AERIANA SPEECE MRN: [id number] Date of Birth: [birth year]   OCCUPATIONAL THERAPY DISCHARGE SUMMARY  Visits from Start of Care: 7  Current functional level related to goals / functional outcomes: See goals and impression statement above   Remaining deficits: See above   Education / Equipment: See above Plan: Patient agrees to discharge.  Patient goals were met. Patient is being discharged due to meeting the stated rehab goals.  ?????         Note reviewed by clinical instructor and accurately reflects treatment session.   Ailene Ravel, OTR/L,CBIS  [phone]

## 2016-11-30 NOTE — Therapy (Signed)
[place], [place], [address] Phone: [phone]   Fax:  [phone]  Physical Therapy Treatment (Discharge)  Patient Details  Name: Shelley Frazier MRN: [id number] Date of Birth: 11-13-1965 Referring Provider: Reesa Chew PA-C   Encounter Date: 11/30/2016      PT End of Session - 11/30/16 0938    Visit Number 7   Number of Visits 7   Date for PT Re-Evaluation 11/30/16   Authorization Type [place] Time Period 10/14/16 to 12/14/16   Authorization - Visit Number 7   Authorization - Number of Visits 7   PT Start Time 0903   PT Stop Time 0933  DC today- no further need for skilled PT services    PT Time Calculation (min) 30 min   Equipment Utilized During Treatment Gait belt   Activity Tolerance Patient tolerated treatment well   Behavior During Therapy [facility] for tasks assessed/performed      Past Medical History:  Diagnosis Date  . Back pain   . Diabetes mellitus without complication ([place])   . Fatty liver   . Gallstones   . Hot flashes 07/30/2013  . Hypertension   . Obesity   . Perimenopausal symptoms 07/30/2013    Past Surgical History:  Procedure Laterality Date  . TONSILLECTOMY AND ADENOIDECTOMY    . TUBAL LIGATION      There were no vitals filed for this visit.      Subjective Assessment - 11/30/16 0904    Subjective Paitent arrives stating that her main concern is really just that it takes her a little extra time, this is not a huge deal as she takes her time anyway. No falls or close calls recently. No other real functional concerns, everything is going well otherwise in terms of PT.    Pertinent History L pons CVA, basilar artery stenosis, HTN, DM, back pain    Patient Stated Goals improved R LE strenght/speed, return to PLOF    Currently in Pain? No/denies            [facility] PT Assessment - 11/30/16 0001      Strength   Right Hip Flexion 4+/5   Right Hip  Extension 3/5   Right Hip ABduction 3+/5   Left Hip Flexion 5/5   Left Hip Extension 4/5   Left Hip ABduction 5/5   Right Knee Flexion 4/5   Right Knee Extension 4+/5   Left Knee Flexion 4+/5   Left Knee Extension 5/5   Right Ankle Dorsiflexion 5/5   Left Ankle Dorsiflexion 5/5     6 minute walk test results    Aerobic Endurance Distance Walked 610   Endurance additional comments 3MWT, no device      Dynamic Gait Index   Level Surface Normal   Change in Gait Speed Normal   Gait with Horizontal Head Turns Normal   Gait with Vertical Head Turns Normal   Gait and Pivot Turn Normal   Step Over Obstacle Normal   Step Around Obstacles Normal   Steps Mild Impairment   Total Score 23                     [facility] Adult PT Treatment/Exercise - 11/30/16 0001      Knee/Hip Exercises: Sidelying   Hip ABduction Right;1 set;10 reps   Hip ABduction Limitations cues for form      Knee/Hip Exercises: Prone   Hip Extension  Both;1 set;10 reps   Hip Extension Limitations knee bent                 PT Education - 11/30/16 0937    Education provided Yes   Education Details review of goals, progress with skilled PT services, DC today; final HEP and general activity recommendations/updates including [employer]    Person(s) Educated Patient   Methods Explanation;Handout;Demonstration   Comprehension Verbalized understanding;Returned demonstration          PT Short Term Goals - 11/30/16 0921      PT SHORT TERM GOAL #1   Title Patient to be able to complete functional sit to stand with even weightbearing/no offshift to L LE to show improved functional strength and proprioception post-CVA    Baseline 7/3- improved, minimal offshift in last 20% of motion    Time 4   Period Weeks   Status Achieved     PT SHORT TERM GOAL #2   Title Patient to show improved gait pattern including reduced scissoring, improved R LE stance form and time, equal step lengths, increased gait speed  and elimination of mild drift L/R during gait to show improved safety and efficiency of mobility    Time 4   Period Weeks   Status Achieved     PT SHORT TERM GOAL #3   Title Patient to be participatory in regular walking program, at least 10-15 minutes continuously and 5 days per week, in order to address reduced functional activity tolerance and promote recovery post-CVA    Baseline 7/3- doing well, longerst she has walked consecutively 20 minutes    Time 4   Period Weeks   Status Achieved     PT SHORT TERM GOAL #4   Title Patient to correctly and consistently perform appropriate HEP, to be updated weekly    Baseline 7/3- compliant    Time 1   Period Weeks   Status Achieved           PT Long Term Goals - 11/30/16 9937      PT LONG TERM GOAL #1   Title Patient to show functional strength as having improved by 1 MMT grade in all tested muscles in order to improve R LE stabilty and overall balance/gait    Baseline 7/3- mostly improved, some hip muscles remain weak    Time 8   Period Weeks   Status Partially Met     PT LONG TERM GOAL #2   Title Patient to be able to ambulate 611f during 3MWT without device and minimal fatigue in order to show improved mobility and community access    Baseline 7/3- 610 no device    Time 8   Period Weeks   Status Achieved     PT LONG TERM GOAL #3   Title Patient to score at least 20/24 on DGI in order to show improved dynamic balance and reduced overall fall risk    Baseline 7/3- 23   Time 8   Period Weeks   Status Partially Met     PT LONG TERM GOAL #4   Title Patient to be able to complete floor to stand transfer with Mod(I) in order to show independence in safely recovering in the event of a fall as well as improved functional strength    Baseline 7/3- able to do this with extended time, Mod(I)   Time 8   Period Weeks   Status Achieved     PT LONG TERM GOAL #5  Title Patient to be ambulating safely in community without assistive  device and minimal unsteadiness in order to assist in returning to PLOF based activities    Baseline 7/3- just started this recently, this is going well    Time 8   Period Weeks   Status Achieved               Plan - 11/30/16 5277    Clinical Impression Statement Re-assessment performed today. Patient has made excellent progress with skilled PT services and shows great progress with all objective measures today. She has met or partially met the majority of her goals at this time and reports no functional challenges or concerns at this point. Recommend DC from skilled PT services today due to goals being met/high level of function.    Rehab Potential Excellent   Clinical Impairments Affecting Rehab Potential (+) high PLOF, very high level of motivation to participate with PT, supportive family    PT Next Visit Plan DC today    PT Home Exercise Plan Eval: tall kneeling with weight shift to R LE, reciprocal crawling forwards and backwards on bed, sit to stand with weight shift to R LE; 5/30: forward and lateral step ups, tandem stance, lateral stepping; 6/12: SLS, tandem gait; 7/3: isolated hip ABD and extensor strength, [employer]    Consulted and Agree with Plan of Care Patient      Patient will benefit from skilled therapeutic intervention in order to improve the following deficits and impairments:  Abnormal gait, Decreased coordination, Decreased mobility, Impaired tone, Decreased activity tolerance, Decreased strength, Decreased balance, Difficulty walking  Visit Diagnosis: Muscle weakness (generalized)  Unsteadiness on feet  Difficulty in walking, not elsewhere classified  Other abnormalities of gait and mobility     Problem List Patient Active Problem List   Diagnosis Date Noted  . Hypoglycemia   . Hypertriglyceridemia 10/11/2016  . Diarrhea due to malabsorption   . Benign essential HTN   . Transaminitis   . Acute ischemic stroke ([place])   . Hyperglycemia   . Chronic  diastolic congestive heart failure ([place])   . Right hemiparesis ([place])   . Dysarthria, post-stroke   . Poorly controlled diabetes mellitus ([place])   . Dyslipidemia   . Mixed hyperlipidemia   . Basilar artery stenosis   . 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[place]) 10/01/2016  . DM (diabetes mellitus), type 2 with neurological complications ([place]) 82/42/3536  . Hot flashes 07/30/2013  . Hypertension 07/30/2013  . Perimenopausal symptoms 07/30/2013    PHYSICAL THERAPY DISCHARGE SUMMARY  Visits from Start of Care: 7  Current functional level related to goals / functional outcomes: Patient at high level of function and has met or partially met majority of goals. DC today due to patient satisfaction/high level of function.    Remaining deficits: Very mild gait deviation and unsteadiness, reduced functional activity tolerance    Education / Equipment: See above  Plan: Patient agrees to discharge.  Patient goals were met. Patient is being discharged due to meeting the stated rehab goals.  ?????      Deniece Ree PT, DPT [place] [address] [place], [place], [address] Phone: [phone]   Fax:  [phone]  Name: Shelley Frazier MRN: [id number] Date of Birth: 07-25-1965

## 2016-12-14 ENCOUNTER — Encounter: Payer: Self-pay | Admitting: Diagnostic Neuroimaging

## 2016-12-14 ENCOUNTER — Ambulatory Visit (INDEPENDENT_AMBULATORY_CARE_PROVIDER_SITE_OTHER): Payer: BC Managed Care – PPO | Admitting: Diagnostic Neuroimaging

## 2016-12-14 VITALS — BP 181/98 | HR 76 | Ht 63.0 in | Wt 176.0 lb

## 2016-12-14 DIAGNOSIS — E781 Pure hyperglyceridemia: Secondary | ICD-10-CM

## 2016-12-14 DIAGNOSIS — E1149 Type 2 diabetes mellitus with other diabetic neurological complication: Secondary | ICD-10-CM

## 2016-12-14 DIAGNOSIS — I10 Essential (primary) hypertension: Secondary | ICD-10-CM | POA: Diagnosis not present

## 2016-12-14 DIAGNOSIS — I6322 Cerebral infarction due to unspecified occlusion or stenosis of basilar arteries: Secondary | ICD-10-CM

## 2016-12-14 DIAGNOSIS — I6350 Cerebral infarction due to unspecified occlusion or stenosis of unspecified cerebral artery: Secondary | ICD-10-CM

## 2016-12-14 DIAGNOSIS — I639 Cerebral infarction, unspecified: Secondary | ICD-10-CM

## 2016-12-14 NOTE — Patient Instructions (Signed)
Thank you for coming to see [place] at [facility]. I hope we have been able to provide you high quality care today.  You may receive a patient satisfaction survey over the next few weeks. We would appreciate your feedback and comments so that we may continue to improve ourselves and the health of our patients.  - dual antiplatelet therapy for 3 months (aspirin + plavix), then reduce to plavix alone (starting January 03, 2017)  - continue statin for lipid mgmt  - continue diabetes mgmt  - long term systolic BP goal = [phone] due to basilar artery stenosis   ~~~~~~~~~~~~~~~~~~~~~~~~~~~~~~~~~~~~~~~~~~~~~~~~~~~~~~~~~~~~~~~~~  DR. PENUMALLI'S GUIDE TO HAPPY AND HEALTHY LIVING These are some of my general health and wellness recommendations. Some of them may apply to you better than others. Please use common sense as you try these suggestions and feel free to ask me any questions.   ACTIVITY/FITNESS Mental, social, emotional and physical stimulation are very important for brain and body health. Try learning a new activity (arts, music, language, sports, games).  Keep moving your body to the best of your abilities. You can do this at home, inside or outside, the park, community center, gym or anywhere you like. Consider a physical therapist or personal trainer to get started. Consider the app Sworkit. Fitness trackers such as smart-watches, smart-phones or Fitbits can help as well.   NUTRITION Eat more plants: colorful vegetables, nuts, seeds and berries.  Eat less sugar, salt, preservatives and processed foods.  Avoid toxins such as cigarettes and alcohol.  Drink water when you are thirsty. Warm water with a slice of lemon is an excellent morning drink to start the day.  Consider these websites for more information The Nutrition Source (https://www.henry-hernandez.biz/) Precision Nutrition (WindowBlog.ch)   RELAXATION Consider  practicing mindfulness meditation or other relaxation techniques such as deep breathing, prayer, yoga, tai chi, massage. See website mindful.org or the apps Headspace or Calm to help get started.   SLEEP Try to get at least 7-8+ hours sleep per day. Regular exercise and reduced caffeine will help you sleep better. Practice good sleep hygeine techniques. See website sleep.org for more information.   PLANNING Prepare estate planning, living will, healthcare POA documents. Sometimes this is best planned with the help of an attorney. Theconversationproject.org and agingwithdignity.org are excellent resources.

## 2016-12-14 NOTE — Progress Notes (Signed)
[facility]  PATIENT: Shelley Frazier DOB: [birth year]  REFERRING CLINICIAN: Domenic Polite HISTORY FROM: patient and husband  REASON FOR VISIT: new consult    HISTORICAL  CHIEF COMPLAINT:  Chief Complaint  Patient presents with  . Hospitalization Follow-up  . Cerebrovascular Accident    Stroke 09-30-16, finished up PT/OT in [place]    HISTORY OF PRESENT ILLNESS:   51 year old female with hypertension, diabetes, hypercholesteremia, here for evaluation of stroke hospital discharge follow-up. Patient developed slurred speech and right-sided weakness on 09/30/16. Patient presented to [facility], was found to have acute stroke in the left pons. She was also found to have significant mid basilar stenosis. Patient was treated medically with aspirin, Plavix, statin, blood pressure control and diabetes control. Long-term systolic blood pressure goal of 1:30 to 150 was recommended due to basilar artery stenosis.  Since discharge patient is gradually improving. She stopped using a cane a few weeks ago. She is tolerating her medications.    REVIEW OF SYSTEMS: Full 14 system review of systems performed and negative with exception of: Weakness.  ALLERGIES: Allergies  Allergen Reactions  . Metformin And Related     Unable to tolerate dose above 500 mg bid  . Vicodin [Hydrocodone-Acetaminophen] Nausea And Vomiting    HOME MEDICATIONS: Outpatient Medications Prior to Visit  Medication Sig Dispense Refill  . aspirin 325 MG tablet Take 1 tablet (325 mg total) by mouth daily.    Marland Kitchen atorvastatin (LIPITOR) 80 MG tablet Take 1 tablet (80 mg total) by mouth daily at 6 PM. 30 tablet 0  . clopidogrel (PLAVIX) 75 MG tablet Take 1 tablet (75 mg total) by mouth daily. 30 tablet 0  . Insulin Glargine (LANTUS) 100 UNIT/ML Solostar Pen Inject 48 Units into the skin daily at 10 pm. 15 mL 0  . Insulin Pen Needle (PEN NEEDLES) 31G X 6 MM MISC 1 application by Does not apply  route at bedtime. 100 each 0  . metFORMIN (GLUCOPHAGE) 500 MG tablet Take 1 tablet (500 mg total) by mouth 2 (two) times daily with a meal. 60 tablet 0  . Multiple Vitamin (MULTIVITAMIN) tablet Take 1 tablet by mouth daily.     No facility-administered medications prior to visit.     PAST MEDICAL HISTORY: Past Medical History:  Diagnosis Date  . Back pain   . Diabetes mellitus without complication ([place])   . Fatty liver   . Gallstones   . High cholesterol   . Hot flashes 07/30/2013  . Hypertension   . Obesity   . Perimenopausal symptoms 07/30/2013    PAST SURGICAL HISTORY: Past Surgical History:  Procedure Laterality Date  . TONSILLECTOMY AND ADENOIDECTOMY    . TUBAL LIGATION      FAMILY HISTORY: Family History  Problem Relation Age of Onset  . Hypertension Mother   . Gallbladder disease Mother   . Diabetes Father   . Hypertension Father   . Gallbladder disease Maternal Aunt   . Heart disease Paternal Aunt   . Heart disease Paternal Uncle   . Diabetes Maternal Grandfather   . Heart disease Paternal Grandmother   . Diabetes Paternal Grandmother     SOCIAL HISTORY:  Social History   Social History  . Marital status: Married    Spouse name: N/A  . Number of children: N/A  . Years of education: N/A   Occupational History  . Not on file.   Social History Main Topics  . Smoking status: Never Smoker  .  Smokeless tobacco: Never Used  . Alcohol use Yes     Comment: occ  . Drug use: No  . Sexual activity: Yes    Birth control/ protection: Surgical, Post-menopausal     Comment: tubal   Other Topics Concern  . Not on file   Social History Narrative   Lives at home with husband and 2 adult children.  Works for [employer]. Education Some college.       PHYSICAL EXAM  GENERAL EXAM/CONSTITUTIONAL: Vitals:  Vitals:   12/14/16 0836  BP: (!) 181/98  Pulse: 76  Weight: 176 lb (79.8 kg)  Height: 5\' 3"  (1.6 m)     Body mass index is 31.18 kg/m.  Visual Acuity  Screening   Right eye Left eye Both eyes  Without correction:     With correction: 20/70 20/30      Patient is in no distress; well developed, nourished and groomed; neck is supple  CARDIOVASCULAR:  Examination of carotid arteries is normal; no carotid bruits  Regular rate and rhythm, no murmurs  Examination of peripheral vascular system by observation and palpation is normal  EYES:  Ophthalmoscopic exam of optic discs and posterior segments is normal; no papilledema or hemorrhages  MUSCULOSKELETAL:  Gait, strength, tone, movements noted in Neurologic exam below  NEUROLOGIC: MENTAL STATUS:  No flowsheet data found.  awake, alert, oriented to person, place and time  recent and remote memory intact  normal attention and concentration  language fluent, comprehension intact, naming intact,   fund of knowledge appropriate  CRANIAL NERVE:   2nd - no papilledema on fundoscopic exam  2nd, 3rd, 4th, 6th - pupils equal and reactive to light, visual fields full to confrontation, extraocular muscles intact, no nystagmus  5th - facial sensation symmetric  7th - facial strength --> SLIGHT DECR RIGHT NL FOLD  8th - hearing intact  9th - palate elevates symmetrically, uvula midline  11th - shoulder shrug symmetric  12th - tongue protrusion midline  MILD SLURRED SPEECH  MOTOR:   normal bulk and tone, full strength in the LUE, LLE  RUE 4+   RLE 4+   SLIGHTLY SLOWER MOVEMENTS IN RIGHT SIDE  SENSORY:   normal and symmetric to light touch, temperature, vibration  COORDINATION:   finger-nose-finger, fine finger movements --> SLOW ON RIGHT SIDE  REFLEXES:   deep tendon reflexes BRISK IN RUE  ABSENT AT ANKLES  GAIT/STATION:   SLIGHT LIMP ON RIGHT LEG; SLIGHTLY UNSTEADY AND CAUTIOUS    DIAGNOSTIC DATA (LABS, IMAGING, TESTING) - I reviewed patient records, labs, notes, testing and imaging myself where available.  Lab Results  Component Value Date    WBC 6.6 10/11/2016   HGB 12.7 10/11/2016   HCT 36.8 10/11/2016   MCV 89.8 10/11/2016   PLT 238 10/11/2016      Component Value Date/Time   NA 142 10/11/2016 0446   K 3.7 10/11/2016 0446   CL 109 10/11/2016 0446   CO2 24 10/11/2016 0446   GLUCOSE 115 (H) 10/11/2016 0446   BUN 12 10/11/2016 0446   CREATININE 0.78 10/11/2016 0446   CREATININE 0.69 07/30/2013 1235   CALCIUM 8.9 10/11/2016 0446   PROT 7.1 10/05/2016 0523   ALBUMIN 3.7 10/05/2016 0523   AST 40 10/05/2016 0523   ALT 59 (H) 10/05/2016 0523   ALKPHOS 71 10/05/2016 0523   BILITOT 0.9 10/05/2016 0523   GFRNONAA >60 10/11/2016 0446   GFRAA >60 10/11/2016 0446   Lab Results  Component Value Date  CHOL 267 (H) 10/02/2016   HDL 25 (L) 10/02/2016   LDLCALC UNABLE TO CALCULATE IF TRIGLYCERIDE OVER 400 mg/dL 10/02/2016   TRIG 660 (H) 10/02/2016   CHOLHDL 10.7 10/02/2016   Lab Results  Component Value Date   HGBA1C 13.6 (H) 10/01/2016   No results found for: VITAMINB12 Lab Results  Component Value Date   TSH 0.665 07/30/2013    10/01/16 MRI brain [I reviewed images myself and agree with interpretation. -VRP]  - Acute nonhemorrhagic LEFT pontine brainstem infarct. - Premature for age atrophy and chronic microvascular ischemic change.  10/01/16 MRA head - Moderate to advanced mid basilar stenosis with luminal irregularity/flap implying focal dissection or irregular plaque. This abnormality is at the level of the patient's acute pontine infarct.  10/02/16 CTA head/neck [I reviewed images myself and agree with interpretation. -VRP]  1. Severe mid basilar stenosis. No visible flap or ulceration to correlate with luminal irregularity on MRA yesterday. 2. Intracranial atheromatous irregularity including at least moderate left P2 segment stenosis. 3. Mild atherosclerosis at the cervical carotid bifurcations. No stenosis in the neck.  10/03/16 TTE  - LVEF 60-65%, moderate LVH, normal wall motion, grade 1 DD with normal LV  filling pressure, normal LA size, trivial to small pericardial effusion without tamponade.     ASSESSMENT AND PLAN  51 y.o. year old female here with left pontine stroke related hypertension, diabetes, hyperlipidemia, resulting in basal artery stenosis. Patient stable.    Dx:  1. 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[place])   2. Basilar artery stenosis with infarction ([place])   3. DM (diabetes mellitus), type 2 with neurological complications ([place])   4. Hypertriglyceridemia   5. Essential hypertension      PLAN: - dual antiplatelet therapy for 3 months (aspirin + plavix), then reduce to plavix alone (starting January 03, 2017) - continue statin for lipid mgmt - continue diabetes mgmt - long term systolic BP goal = 671-245 due to basilar artery stenosis  Return if symptoms worsen or fail to improve, for return to PCP.    Penni Bombard, MD 01/07/9832, 8:25 AM Certified in Neurology, Neurophysiology and Neuroimaging  [facility] [address], [place], [place] [address] [phone]

## 2016-12-22 ENCOUNTER — Encounter
Payer: BC Managed Care – PPO | Attending: Physical Medicine & Rehabilitation | Admitting: Physical Medicine & Rehabilitation

## 2016-12-22 ENCOUNTER — Encounter: Payer: Self-pay | Admitting: Physical Medicine & Rehabilitation

## 2016-12-22 VITALS — BP 175/105 | HR 76

## 2016-12-22 DIAGNOSIS — M62838 Other muscle spasm: Secondary | ICD-10-CM

## 2016-12-22 DIAGNOSIS — G8191 Hemiplegia, unspecified affecting right dominant side: Secondary | ICD-10-CM

## 2016-12-22 DIAGNOSIS — K760 Fatty (change of) liver, not elsewhere classified: Secondary | ICD-10-CM | POA: Diagnosis not present

## 2016-12-22 DIAGNOSIS — E119 Type 2 diabetes mellitus without complications: Secondary | ICD-10-CM | POA: Insufficient documentation

## 2016-12-22 DIAGNOSIS — Z5189 Encounter for other specified aftercare: Secondary | ICD-10-CM | POA: Diagnosis present

## 2016-12-22 DIAGNOSIS — E1149 Type 2 diabetes mellitus with other diabetic neurological complication: Secondary | ICD-10-CM

## 2016-12-22 DIAGNOSIS — I639 Cerebral infarction, unspecified: Secondary | ICD-10-CM | POA: Insufficient documentation

## 2016-12-22 DIAGNOSIS — R269 Unspecified abnormalities of gait and mobility: Secondary | ICD-10-CM | POA: Insufficient documentation

## 2016-12-22 DIAGNOSIS — I69359 Hemiplegia and hemiparesis following cerebral infarction affecting unspecified side: Secondary | ICD-10-CM

## 2016-12-22 DIAGNOSIS — E669 Obesity, unspecified: Secondary | ICD-10-CM | POA: Diagnosis not present

## 2016-12-22 DIAGNOSIS — I651 Occlusion and stenosis of basilar artery: Secondary | ICD-10-CM | POA: Diagnosis not present

## 2016-12-22 DIAGNOSIS — I69322 Dysarthria following cerebral infarction: Secondary | ICD-10-CM

## 2016-12-22 DIAGNOSIS — I10 Essential (primary) hypertension: Secondary | ICD-10-CM | POA: Insufficient documentation

## 2016-12-22 NOTE — Progress Notes (Addendum)
Subjective:    Patient ID: Shelley Frazier, female    DOB: 03-Oct-1965, 51 y.o.   MRN: [id number]  HPI 51 y.o. right hand female with history of HTN, T2DM, fatty liver who presents for follow up for acute nonhemorrhagic left pontine brainstem infarct.   Last clinic visit 10/20/16.  Since that time, she saw Neurology and was discharged. BP is elevated, but husband states in 140s at home.  Her CBGs have been relatively controlled.  Denies falls since last visit.  She completed therapy.  She uses cane.     Pain Inventory Average Pain 4 Pain Right Now 3 My pain is dull and aching  In the last 24 hours, has pain interfered with the following? General activity 4 Relation with others 4 Enjoyment of life 3 What TIME of day is your pain at its worst? . Sleep (in general) Fair  Pain is worse with: walking Pain improves with: rest and heat/ice Relief from Meds: 0  Mobility walk with assistance use a cane ability to climb steps?  yes do you drive?  yes  Function employed # of hrs/week 40 I need assistance with the following:  household duties and shopping  Neuro/Psych weakness  Prior Studies Any changes since last visit?  no  Physicians involved in your care Any changes since last visit?  no   Family History  Problem Relation Age of Onset  . Hypertension Mother   . Gallbladder disease Mother   . Diabetes Father   . Hypertension Father   . Gallbladder disease Maternal Aunt   . Heart disease Paternal Aunt   . Heart disease Paternal Uncle   . Diabetes Maternal Grandfather   . Heart disease Paternal Grandmother   . Diabetes Paternal Grandmother    Social History   Social History  . Marital status: Married    Spouse name: N/A  . Number of children: N/A  . Years of education: N/A   Social History Main Topics  . Smoking status: Never Smoker  . Smokeless tobacco: Never Used  . Alcohol use Yes     Comment: occ  . Drug use: No  . Sexual activity: Yes    Birth  control/ protection: Surgical, Post-menopausal     Comment: tubal   Other Topics Concern  . Not on file   Social History Narrative   Lives at home with husband and 2 adult children.  Works for [employer]. Education Some college.     Past Surgical History:  Procedure Laterality Date  . TONSILLECTOMY AND ADENOIDECTOMY    . TUBAL LIGATION     Past Medical History:  Diagnosis Date  . Back pain   . Diabetes mellitus without complication ([place])   . Fatty liver   . Gallstones   . High cholesterol   . Hot flashes 07/30/2013  . Hypertension   . Obesity   . Perimenopausal symptoms 07/30/2013   BP (!) 175/105   Pulse 76   LMP 04/18/2013   SpO2 97%   Opioid Risk Score:   Fall Risk Score:  `1  Depression screen PHQ 2/9  Depression screen PHQ 2/9 07/28/2016  Decreased Interest 0  Down, Depressed, Hopeless 0  PHQ - 2 Score 0    Review of Systems  HENT: Negative.   Eyes: Negative.   Respiratory: Negative.   Cardiovascular: Negative.   Gastrointestinal: Negative.   Endocrine: Negative.   Genitourinary: Negative.   Musculoskeletal: Positive for gait problem.  Skin:  Easy bruising  Allergic/Immunologic: Negative.   Neurological: Positive for speech difficulty and weakness.  Hematological: Negative.   Psychiatric/Behavioral: Negative.   All other systems reviewed and are negative.     Objective:   Physical Exam Constitutional: She appears well-developed and well-nourished. NAD. HENT: Normocephalic and atraumatic.  Eyes: EOMI. No discharge.  Cardiovascular: RRR no JVD. Respiratory: CTA B with normal effort GI: Soft. Bowel sounds are normal.  Musculoskeletal: She exhibits no edema or tenderness.  Gait: Slow cadence with right lean Neurological: She is alert and oriented.  Right facial weakness with mild dysarthria Sensation intact to light touch throughout. Motor: LUE/LLE 5/5 proximal to distal  RUE: 4/5 proximal to distal   RLE: 4+/5 proximal to distal  Skin: Skin is  warm and dry.  Psychiatric: She has a normal mood and affect. Her behavior is normal. Judgment and thought content normal     Assessment & Plan:  51 y.o. right hand female with history of HTN, T2DM, fatty liver who presents for follow up for acute nonhemorrhagic left pontine brainstem infarct.  1.  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   Completed therapies, cont HEP  Released from Neurology  Cont meds  Out of work until follow up  2. HTN  Cont meds  Cont follow up with PCP  Elevated in office, however, pt states WNL at home.  3. T2DM  Cont meds  Cont follow up with PCP  In relatively good control at present, with some low normal readings, encouraged follow up with PCP if persistently low  4. Neurological gait abnormality  Cont cane for safety, less reliant  5. Muscle spasms  Robaxin 500 BID PRN

## 2016-12-22 NOTE — Addendum Note (Signed)
Addended by: Delice Lesch A on: 12/22/2016 09:54 AM   Modules accepted: Level of Service

## 2016-12-24 ENCOUNTER — Telehealth: Payer: Self-pay | Admitting: *Deleted

## 2016-12-24 MED ORDER — METHOCARBAMOL 500 MG PO TABS: 500.0000 mg | ORAL_TABLET | Freq: Two times a day (BID) | ORAL | 1 refills | Status: DC | PRN

## 2016-12-24 NOTE — Telephone Encounter (Signed)
Prescription e-scribed.  Patient may come to pick up paperwork.  Thanks.

## 2016-12-24 NOTE — Telephone Encounter (Signed)
Patient following up on paperwork that was left for Dr. Posey Pronto to fill out regarding work (?). She is also wondering about a script  for a muscle relaxer that was supposed to be sent to her pharmacy for a muscle relaxer.  She contacted her pharmacy and they said they had not received it. Presumably Robaxin 500 mg BID.

## 2017-01-28 ENCOUNTER — Encounter: Payer: Self-pay | Admitting: Physical Medicine & Rehabilitation

## 2017-01-28 ENCOUNTER — Encounter
Payer: BC Managed Care – PPO | Attending: Physical Medicine & Rehabilitation | Admitting: Physical Medicine & Rehabilitation

## 2017-01-28 VITALS — BP 175/90 | HR 76 | Resp 14

## 2017-01-28 DIAGNOSIS — I69322 Dysarthria following cerebral infarction: Secondary | ICD-10-CM | POA: Diagnosis not present

## 2017-01-28 DIAGNOSIS — I6350 Cerebral infarction due to unspecified occlusion or stenosis of unspecified cerebral artery: Secondary | ICD-10-CM

## 2017-01-28 DIAGNOSIS — I651 Occlusion and stenosis of basilar artery: Secondary | ICD-10-CM

## 2017-01-28 DIAGNOSIS — I639 Cerebral infarction, unspecified: Secondary | ICD-10-CM | POA: Insufficient documentation

## 2017-01-28 DIAGNOSIS — M62838 Other muscle spasm: Secondary | ICD-10-CM

## 2017-01-28 DIAGNOSIS — I10 Essential (primary) hypertension: Secondary | ICD-10-CM | POA: Insufficient documentation

## 2017-01-28 DIAGNOSIS — K760 Fatty (change of) liver, not elsewhere classified: Secondary | ICD-10-CM | POA: Diagnosis not present

## 2017-01-28 DIAGNOSIS — Z5189 Encounter for other specified aftercare: Secondary | ICD-10-CM | POA: Diagnosis present

## 2017-01-28 DIAGNOSIS — E119 Type 2 diabetes mellitus without complications: Secondary | ICD-10-CM | POA: Diagnosis present

## 2017-01-28 DIAGNOSIS — E669 Obesity, unspecified: Secondary | ICD-10-CM | POA: Diagnosis not present

## 2017-01-28 DIAGNOSIS — E1149 Type 2 diabetes mellitus with other diabetic neurological complication: Secondary | ICD-10-CM

## 2017-01-28 DIAGNOSIS — R269 Unspecified abnormalities of gait and mobility: Secondary | ICD-10-CM | POA: Diagnosis not present

## 2017-01-28 DIAGNOSIS — G8191 Hemiplegia, unspecified affecting right dominant side: Secondary | ICD-10-CM | POA: Diagnosis not present

## 2017-01-28 DIAGNOSIS — I69359 Hemiplegia and hemiparesis following cerebral infarction affecting unspecified side: Secondary | ICD-10-CM

## 2017-01-28 NOTE — Progress Notes (Signed)
Subjective:    Patient ID: Shelley Frazier, female    DOB: 11/12/1965, 51 y.o.   MRN: [id number]  HPI 51 y.o. right hand female with history of HTN, T2DM, fatty liver who presents for follow up for acute nonhemorrhagic left pontine brainstem infarct.   Last clinic visit 12/22/16.  Since last visit, she continues HEP.  Her BP is elevated and is following up with PCP.  Her CBGs have been low/normal, it is also being adjusted by PCP. She is no longer using a cane.  Denies falls. She using Robaxin at times for LLE spasms.   Pain Inventory Average Pain 4 Pain Right Now 5 My pain is dull and aching  In the last 24 hours, has pain interfered with the following? General activity 5 Relation with others 4 Enjoyment of life 4 What TIME of day is your pain at its worst? evening Sleep (in general) Fair  Pain is worse with: bending, sitting and some activites Pain improves with: rest, heat/ice and medication Relief from Meds: 5  Mobility walk without assistance how many minutes can you walk? 30-45 ability to climb steps?  yes do you drive?  yes Do you have any goals in this area?  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  no  Physicians involved in your care Any changes since last visit?  no   Family History  Problem Relation Age of Onset  . Hypertension Mother   . Gallbladder disease Mother   . Diabetes Father   . Hypertension Father   . Gallbladder disease Maternal Aunt   . Heart disease Paternal Aunt   . Heart disease Paternal Uncle   . Diabetes Maternal Grandfather   . Heart disease Paternal Grandmother   . Diabetes Paternal Grandmother    Social History   Social History  . Marital status: Married    Spouse name: N/A  . Number of children: N/A  . Years of education: N/A   Social History Main Topics  . Smoking  status: Never Smoker  . Smokeless tobacco: Never Used  . Alcohol use Yes     Comment: occ  . Drug use: No  . Sexual activity: Yes    Birth control/ protection: Surgical, Post-menopausal     Comment: tubal   Other Topics Concern  . None   Social History Narrative   Lives at home with husband and 2 adult children.  Works for [employer]. Education Some college.     Past Surgical History:  Procedure Laterality Date  . TONSILLECTOMY AND ADENOIDECTOMY    . TUBAL LIGATION     Past Medical History:  Diagnosis Date  . Back pain   . Diabetes mellitus without complication ([place])   . Fatty liver   . Gallstones   . High cholesterol   . Hot flashes 07/30/2013  . Hypertension   . Obesity   . Perimenopausal symptoms 07/30/2013   BP (!) 175/90 (BP Location: Left Arm, Patient Position: Sitting, Cuff Size: Normal)   Pulse 76   Resp 14   LMP 04/18/2013   SpO2 96%   Opioid Risk Score:   Fall Risk Score:  `1  Depression screen PHQ 2/9  Depression screen PHQ 2/9 07/28/2016  Decreased Interest 0  Down, Depressed, Hopeless 0  PHQ - 2 Score 0    Review of Systems  Constitutional:  Positive for diaphoresis.  HENT: Negative.   Eyes: Negative.   Respiratory: Negative.   Cardiovascular: Negative.   Gastrointestinal: Negative.   Endocrine: Negative.        High  Blood sugar  Genitourinary: Negative.   Musculoskeletal:       Spasms  Skin: Negative.        Easy bruising  Allergic/Immunologic: Negative.   Neurological: Positive for speech difficulty and weakness.  Hematological: Negative.   Psychiatric/Behavioral: Negative.   All other systems reviewed and are negative.     Objective:   Physical Exam Constitutional: She appears well-developed and well-nourished. NAD. HENT: Normocephalic and atraumatic.  Eyes: EOMI. No discharge.  Cardiovascular: RRR no JVD. Respiratory: CTA B with normal effort GI: Soft. Bowel sounds are normal.  Musculoskeletal: She exhibits no edema or tenderness.    Gait: Slow cadence with right lean Neurological: She is alert and oriented.  Right facial weakness with mild dysarthria Motor: LUE/LLE 5/5 proximal to distal  RUE: 4/5 proximal to distal (improving) RLE: 4+/5 proximal to distal (improving) Skin: Skin is warm and dry.  Psychiatric: She has a normal mood and affect. Her behavior is normal. Judgment and thought content normal    Assessment & Plan:  51 y.o. right hand female with history of HTN, T2DM, fatty liver who presents for follow up for acute nonhemorrhagic left pontine brainstem infarct.  1.  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  Will fill out paperwork for return to work  Educated on return to driving - patient progressing  2. HTN  Cont meds  Cont follow up with PCP  Elevated in office, recently meds adjusted, encouraged follow up for further adjustments  3. T2DM  Cont meds  Cont follow up with PCP  Low/normal, encouraged follow up with PCP for further adjustments  4. Neurological gait abnormality  Cont HEP  No assistive device at present  5. Muscle spasms  Robaxin 500 BID PRN

## 2017-02-10 ENCOUNTER — Other Ambulatory Visit (HOSPITAL_COMMUNITY): Payer: Self-pay | Admitting: Internal Medicine

## 2017-02-10 DIAGNOSIS — Z1231 Encounter for screening mammogram for malignant neoplasm of breast: Secondary | ICD-10-CM

## 2017-02-21 ENCOUNTER — Ambulatory Visit (HOSPITAL_COMMUNITY)
Admission: RE | Admit: 2017-02-21 | Discharge: 2017-02-21 | Disposition: A | Discharge status: Home / Self Care | Payer: BC Managed Care – PPO | Source: Ambulatory Visit | Attending: Internal Medicine | Admitting: Internal Medicine

## 2017-02-21 DIAGNOSIS — Z1231 Encounter for screening mammogram for malignant neoplasm of breast: Secondary | ICD-10-CM

## 2017-03-03 ENCOUNTER — Ambulatory Visit: Payer: BC Managed Care – PPO | Admitting: Physical Medicine & Rehabilitation

## 2017-12-08 ENCOUNTER — Encounter: Payer: Self-pay | Admitting: Gastroenterology

## 2017-12-09 ENCOUNTER — Other Ambulatory Visit: Payer: Self-pay

## 2017-12-09 ENCOUNTER — Encounter (HOSPITAL_COMMUNITY): Payer: Self-pay | Admitting: Emergency Medicine

## 2017-12-09 ENCOUNTER — Emergency Department (HOSPITAL_COMMUNITY)
Admission: EM | Admit: 2017-12-09 | Discharge: 2017-12-10 | Disposition: A | Discharge status: Home / Self Care | Payer: BC Managed Care – PPO | Attending: Emergency Medicine | Admitting: Emergency Medicine

## 2017-12-09 DIAGNOSIS — E119 Type 2 diabetes mellitus without complications: Secondary | ICD-10-CM | POA: Insufficient documentation

## 2017-12-09 DIAGNOSIS — M62838 Other muscle spasm: Secondary | ICD-10-CM

## 2017-12-09 DIAGNOSIS — Z7901 Long term (current) use of anticoagulants: Secondary | ICD-10-CM | POA: Diagnosis not present

## 2017-12-09 DIAGNOSIS — Z794 Long term (current) use of insulin: Secondary | ICD-10-CM | POA: Insufficient documentation

## 2017-12-09 DIAGNOSIS — Z79899 Other long term (current) drug therapy: Secondary | ICD-10-CM | POA: Insufficient documentation

## 2017-12-09 DIAGNOSIS — M79604 Pain in right leg: Secondary | ICD-10-CM | POA: Diagnosis present

## 2017-12-09 DIAGNOSIS — I110 Hypertensive heart disease with heart failure: Secondary | ICD-10-CM | POA: Insufficient documentation

## 2017-12-09 DIAGNOSIS — I5032 Chronic diastolic (congestive) heart failure: Secondary | ICD-10-CM | POA: Diagnosis not present

## 2017-12-09 MED ORDER — SODIUM CHLORIDE 0.9 % IV BOLUS
1000.0000 mL | Freq: Once | INTRAVENOUS | Status: AC
  Administered 2017-12-09: 1000 mL via INTRAVENOUS

## 2017-12-09 MED ORDER — LORAZEPAM 2 MG/ML IJ SOLN
1.0000 mg | Freq: Once | INTRAMUSCULAR | Status: AC
  Administered 2017-12-09: 1 mg via INTRAVENOUS
  Filled 2017-12-09: qty 1

## 2017-12-09 NOTE — ED Triage Notes (Signed)
Pt c/o of right leg pain x 2.5 hrs with a constant thigh spasm and some leg numbness

## 2017-12-10 LAB — CBC WITH DIFFERENTIAL/PLATELET
Basophils Absolute: 0 10*3/uL (ref 0.0–0.1)
Basophils Relative: 0 %
Eosinophils Absolute: 0 10*3/uL (ref 0.0–0.7)
Eosinophils Relative: 0 %
HCT: 37 % (ref 36.0–46.0)
Hemoglobin: 12.7 g/dL (ref 12.0–15.0)
Lymphocytes Relative: 10 %
Lymphs Abs: 1.3 10*3/uL (ref 0.7–4.0)
MCH: 31 pg (ref 26.0–34.0)
MCHC: 34.3 g/dL (ref 30.0–36.0)
MCV: 90.2 fL (ref 78.0–100.0)
Monocytes Absolute: 0.5 10*3/uL (ref 0.1–1.0)
Monocytes Relative: 4 %
Neutro Abs: 11.9 10*3/uL — ABNORMAL HIGH (ref 1.7–7.7)
Neutrophils Relative %: 86 %
Platelets: 300 10*3/uL (ref 150–400)
RBC: 4.1 MIL/uL (ref 3.87–5.11)
RDW: 12.5 % (ref 11.5–15.5)
WBC: 13.8 10*3/uL — ABNORMAL HIGH (ref 4.0–10.5)

## 2017-12-10 LAB — BASIC METABOLIC PANEL
Anion gap: 11 (ref 5–15)
BUN: 15 mg/dL (ref 6–20)
CO2: 23 mmol/L (ref 22–32)
Calcium: 9.3 mg/dL (ref 8.9–10.3)
Chloride: 107 mmol/L (ref 98–111)
Creatinine, Ser: 0.93 mg/dL (ref 0.44–1.00)
GFR calc Af Amer: >60 mL/min (ref >60)
GFR calc non Af Amer: >60 mL/min (ref >60)
Glucose, Bld: 235 mg/dL — ABNORMAL HIGH (ref 70–99)
Potassium: 3.7 mmol/L (ref 3.5–5.1)
Sodium: 141 mmol/L (ref 135–145)

## 2017-12-10 MED ORDER — LORAZEPAM 2 MG/ML IJ SOLN
0.5000 mg | Freq: Once | INTRAMUSCULAR | Status: AC
  Administered 2017-12-10: 0.5 mg via INTRAVENOUS
  Filled 2017-12-10: qty 1

## 2017-12-10 MED ORDER — KETOROLAC TROMETHAMINE 30 MG/ML IJ SOLN
30.0000 mg | Freq: Once | INTRAMUSCULAR | Status: AC
  Administered 2017-12-10: 30 mg via INTRAVENOUS
  Filled 2017-12-10: qty 1

## 2017-12-10 MED ORDER — DIAZEPAM 5 MG PO TABS: 5.0000 mg | ORAL_TABLET | Freq: Two times a day (BID) | ORAL | 0 refills | Status: DC

## 2017-12-10 NOTE — Discharge Instructions (Addendum)
Drink plenty of water to avoid dehydration.  Call Dr. Juel Burrow office on Monday to arrange a follow-up appt.

## 2017-12-10 NOTE — ED Provider Notes (Signed)
[facility] EMERGENCY DEPARTMENT Provider Note   CSN: [id number] Arrival date & time: 12/09/17  2249     History   Chief Complaint Chief Complaint  Patient presents with  . Leg Pain    HPI Shelley Frazier is a 52 y.o. female.  HPI  Shelley Frazier is a 52 y.o. female who presents to the Emergency Department complaining of persistent pain and twitching of her right thigh.  Symptoms have been present for 2-3 hrs prior to arrival.   She reports a history of muscle cramps to her lower extremities, but states this episode is more severe than usual and has not improved despite trying her usual therapies of warm soaks and massage.  She describes a waxing and waning in intensity with twitching and "tingling" of the muscle in the front of her thigh.  She states that she began taking Synjardy last week for her diabetes and she is concerned that it may be related to the medication.  She denies excessive exertion, possible dehydration, swelling of the extremity, discoloration, numbness and hx of hypokalemia.   Past Medical History:  Diagnosis Date  . Back pain   . Diabetes mellitus without complication ([place])   . Fatty liver   . Gallstones   . High cholesterol   . Hot flashes 07/30/2013  . Hypertension   . Obesity   . Perimenopausal symptoms 07/30/2013    Patient Active Problem List   Diagnosis Date Noted  . Hypoglycemia   . Hypertriglyceridemia 10/11/2016  . Diarrhea due to malabsorption   . Benign essential HTN   . Transaminitis   . Acute ischemic stroke ([place])   . Hyperglycemia   . Chronic diastolic congestive heart failure ([place])   . Right hemiparesis ([place])   . Dysarthria, post-stroke   . Poorly controlled diabetes mellitus ([place])   . Dyslipidemia   . Mixed hyperlipidemia   . Basilar artery stenosis   . 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[place]) 10/01/2016  . DM (diabetes mellitus), type 2 with neurological complications ([place]) 40/98/1191  . Hot flashes 07/30/2013  . Hypertension 07/30/2013   . Perimenopausal symptoms 07/30/2013    Past Surgical History:  Procedure Laterality Date  . TONSILLECTOMY AND ADENOIDECTOMY    . TUBAL LIGATION       OB History    Gravida  2   Para  2   Term  1   Preterm  1   AB      Living  2     SAB      TAB      Ectopic      Multiple      Live Births  2            Home Medications    Prior to Admission medications   Medication Sig Start Date End Date Taking? Authorizing Provider  acetaminophen (TYLENOL) 500 MG tablet Take 1,000 mg by mouth every 6 (six) hours as needed.   Yes [provider]  amLODipine-olmesartan (AZOR) 10-40 MG tablet Take 1 tablet by mouth daily. 11/30/17  Yes [provider]  atorvastatin (LIPITOR) 80 MG tablet Take 1 tablet (80 mg total) by mouth daily at 6 PM. Patient taking differently: Take 80 mg by mouth every morning.  10/12/16  Yes Love, Ivan Anchors, PA-C  clopidogrel (PLAVIX) 75 MG tablet Take 1 tablet (75 mg total) by mouth daily. 10/11/16  Yes Love, Ivan Anchors, PA-C  Insulin Degludec (TRESIBA FLEXTOUCH) 200 UNIT/ML SOPN Inject 30 Units into the  skin every morning.    Yes [provider]  methocarbamol (ROBAXIN) 500 MG tablet Take 1 tablet (500 mg total) by mouth 2 (two) times daily as needed for muscle spasms. 12/24/16  Yes Jamse Arn, MD  Multiple Vitamin (MULTIVITAMIN) tablet Take 1 tablet by mouth daily.   Yes [provider]  SYNJARDY XR 25-1000 MG TB24 Take 1 tablet by mouth daily. 12/02/17  Yes [provider]  diazepam (VALIUM) 5 MG tablet Take 1 tablet (5 mg total) by mouth 2 (two) times daily. 12/10/17   Briena Swingler, PA-C  Insulin Pen Needle (PEN NEEDLES) 31G X 6 MM MISC 1 application by Does not apply route at bedtime. 10/12/16   Bary Leriche, PA-C    Family History Family History  Problem Relation Age of Onset  . Hypertension Mother   . Gallbladder disease Mother   . Diabetes Father   . Hypertension Father   . Gallbladder disease  Maternal Aunt   . Heart disease Paternal Aunt   . Heart disease Paternal Uncle   . Diabetes Maternal Grandfather   . Heart disease Paternal Grandmother   . Diabetes Paternal Grandmother     Social History Social History   Tobacco Use  . Smoking status: Never Smoker  . Smokeless tobacco: Never Used  Substance Use Topics  . Alcohol use: Yes    Comment: occ  . Drug use: No     Allergies   Metformin and related and Vicodin [hydrocodone-acetaminophen]   Review of Systems Review of Systems  Constitutional: Negative for activity change, chills and fever.  Respiratory: Negative for chest tightness and shortness of breath.   Cardiovascular: Negative for chest pain and leg swelling.  Gastrointestinal: Negative for nausea and vomiting.  Genitourinary: Negative for difficulty urinating and dysuria.  Musculoskeletal: Positive for myalgias. Negative for arthralgias, back pain and joint swelling.       Pain and twitching of right thigh  Skin: Negative for color change, rash and wound.  Neurological: Negative for dizziness and weakness.  All other systems reviewed and are negative.    Physical Exam Updated Vital Signs BP (!) 156/71   Pulse 84   Temp 98.6 F (37 C) (Oral)   Resp 14   Ht 5\' 2"  (1.575 m)   Wt 81.6 kg (180 lb)   LMP 04/18/2013   SpO2 100%   BMI 32.92 kg/m   Physical Exam  Constitutional: No distress.  Pt is tearful  HENT:  Head: Atraumatic.  Mouth/Throat: Oropharynx is clear and moist and mucous membranes are normal.  Neck: Normal range of motion.  Cardiovascular: Normal rate, regular rhythm and intact distal pulses.  DP and PT pulses are strong and symmetrical bilaterally  Pulmonary/Chest: Effort normal and breath sounds normal. No respiratory distress.  Musculoskeletal: She exhibits tenderness. She exhibits no edema.  Visualized muscle spasms of anterior muscles of right thigh. no edema, erythema or excessive warmth.  Compartments soft.  Neurological:  She is alert. No sensory deficit. She exhibits normal muscle tone.  Skin: Skin is warm. Capillary refill takes less than 2 seconds. No rash noted. No erythema.  Psychiatric: She has a normal mood and affect.  Nursing note and vitals reviewed.    ED Treatments / Results  Labs (all labs ordered are listed, but only abnormal results are displayed) Labs Reviewed  BASIC METABOLIC PANEL - Abnormal; Notable for the following components:      Result Value   Glucose, Bld 235 (*)  All other components within normal limits  CBC WITH DIFFERENTIAL/PLATELET - Abnormal; Notable for the following components:   WBC 13.8 (*)    Neutro Abs 11.9 (*)    All other components within normal limits    EKG None  Radiology No results found.  Procedures Procedures (including critical care time)  Medications Ordered in ED Medications  LORazepam (ATIVAN) injection 1 mg (1 mg Intravenous Given 12/09/17 2359)  sodium chloride 0.9 % bolus 1,000 mL (0 mLs Intravenous Stopped 12/10/17 0057)  ketorolac (TORADOL) 30 MG/ML injection 30 mg (30 mg Intravenous Given 12/10/17 0200)  LORazepam (ATIVAN) injection 0.5 mg (0.5 mg Intravenous Given 12/10/17 0212)     Initial Impression / Assessment and Plan / ED Course  I have reviewed the triage vital signs and the nursing notes.  Pertinent labs & imaging results that were available during my care of the patient were reviewed by me and considered in my medical decision making (see chart for details).     Pt with visualized muscle spasms to right thigh.  NV intact.  No skin changes.  Labs reassuring. Pt feeling better after IVF's, toradol and ativan.  Pt appears safe for d/c home and agrees to f/u with PCP. Return precautions discussed   Final Clinical Impressions(s) / ED Diagnoses   Final diagnoses:  Muscle spasm of right lower extremity    ED Discharge Orders        Ordered    diazepam (VALIUM) 5 MG tablet  2 times daily     12/10/17 0144         Kem Parkinson, PA-C 12/10/17 1334    Ripley Fraise, MD 12/14/17 [phone]

## 2018-01-09 ENCOUNTER — Telehealth: Payer: Self-pay

## 2018-01-09 ENCOUNTER — Ambulatory Visit: Payer: BC Managed Care – PPO

## 2018-01-09 NOTE — Telephone Encounter (Signed)
PATIENT WAS A NO SHOW AND LETTER SENT  °

## 2018-01-09 NOTE — Telephone Encounter (Signed)
noted 

## 2018-03-27 ENCOUNTER — Other Ambulatory Visit (HOSPITAL_COMMUNITY): Payer: Self-pay | Admitting: Internal Medicine

## 2018-03-27 DIAGNOSIS — Z1231 Encounter for screening mammogram for malignant neoplasm of breast: Secondary | ICD-10-CM

## 2018-04-07 ENCOUNTER — Ambulatory Visit (HOSPITAL_COMMUNITY)
Admission: RE | Admit: 2018-04-07 | Discharge: 2018-04-07 | Disposition: A | Discharge status: Home / Self Care | Payer: BC Managed Care – PPO | Source: Ambulatory Visit | Attending: Internal Medicine | Admitting: Internal Medicine

## 2018-04-07 DIAGNOSIS — Z1231 Encounter for screening mammogram for malignant neoplasm of breast: Secondary | ICD-10-CM | POA: Diagnosis not present

## 2018-11-08 IMAGING — CT CT HEAD W/O CM
3 series · 16 of 47 positions shown, 19 images · non-contrast
Comparison: None

CLINICAL DATA: Slurred speech since yesterday, unable to write with
RIGHT hand, hypertension, diabetes mellitus

EXAM:
CT HEAD WITHOUT CONTRAST
TECHNIQUE: Contiguous axial images were obtained from the base of the skull
through the vertex without intravenous contrast. Sagittal and
coronal MPR images reconstructed from axial data set.

[Series 2: head wo · axial · 0.43mm/px · z∈[-16,+119]mm · 10 of 33 slices shown, 13 images]
[im 3/33  brain]
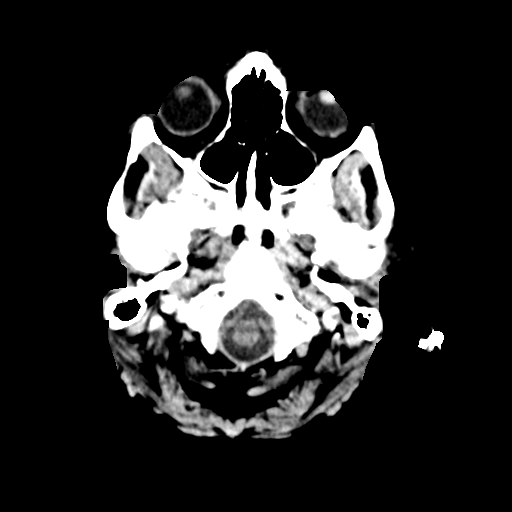
[im 3/33  bone]
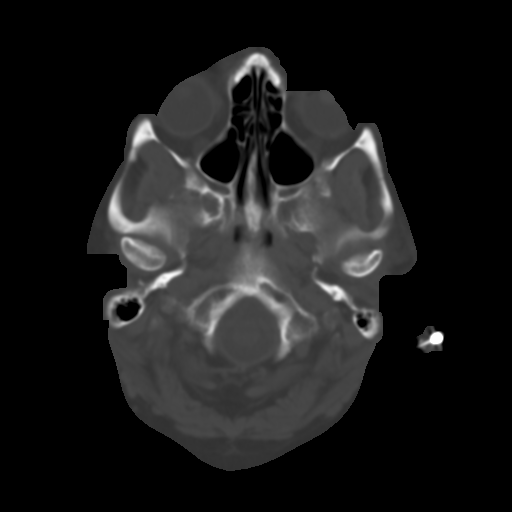
[im 6/33  brain]
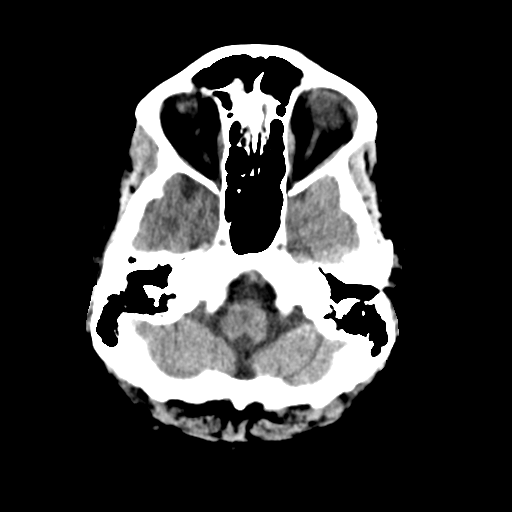
[im 9/33  brain]
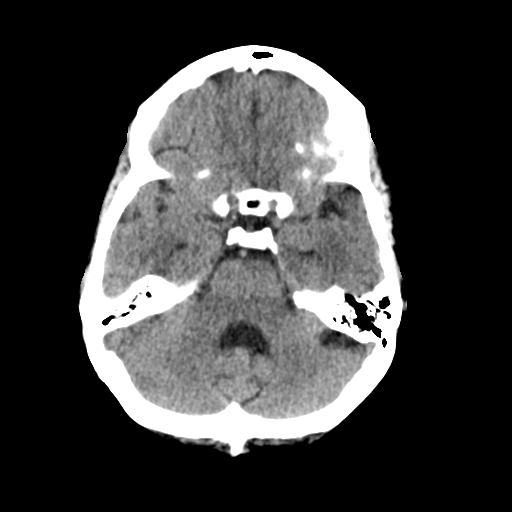
[im 12/33  brain]
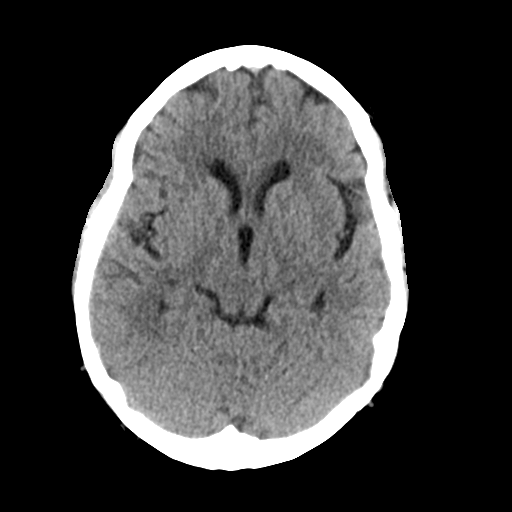
[im 15/33  brain]
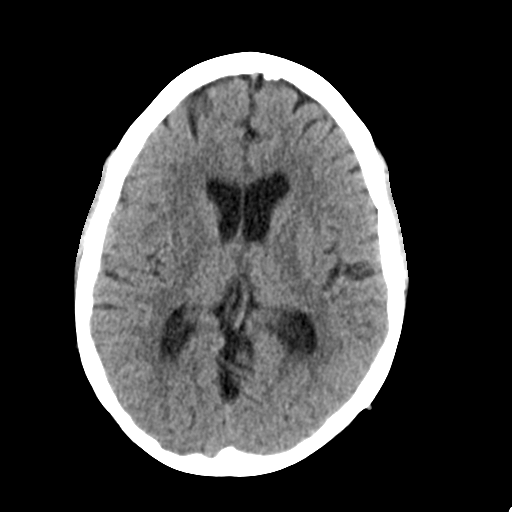
[im 15/33  bone]
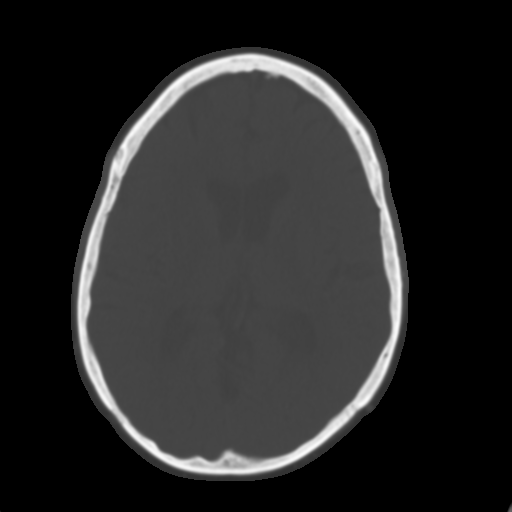
[im 18/33  brain]
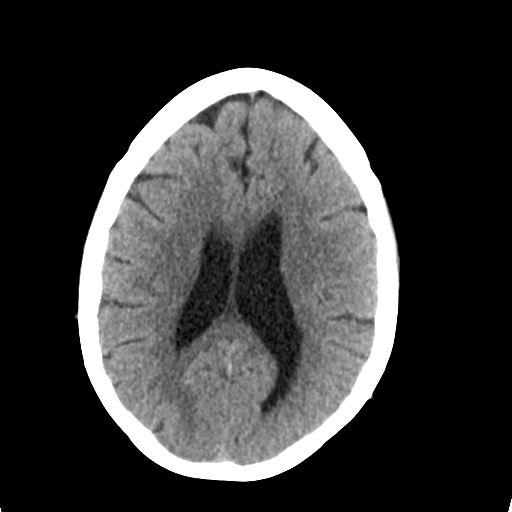
[im 21/33  brain]
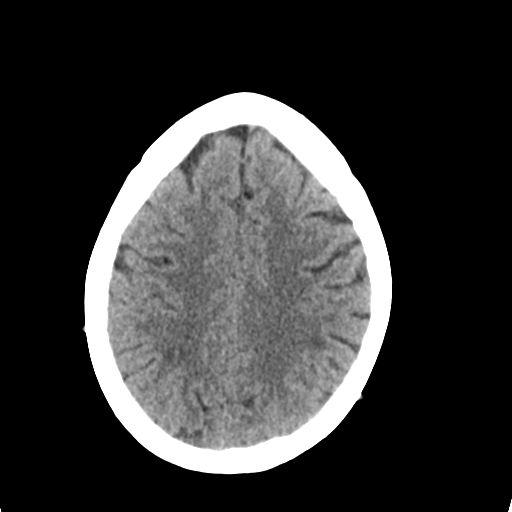
[im 25/33  brain]
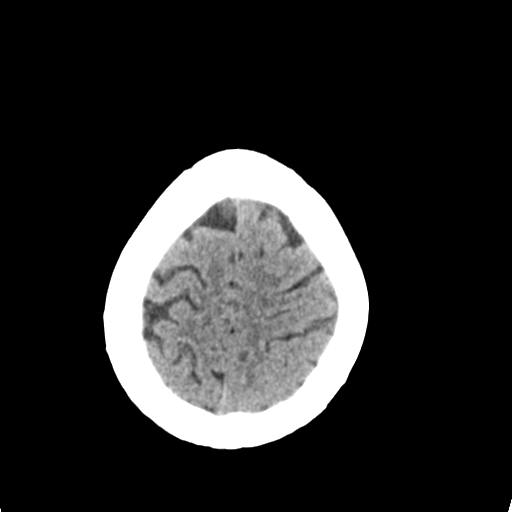
[im 27/33  brain]
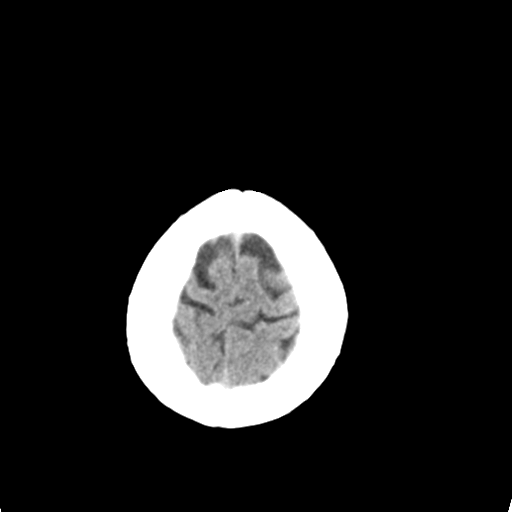
[im 27/33  bone]
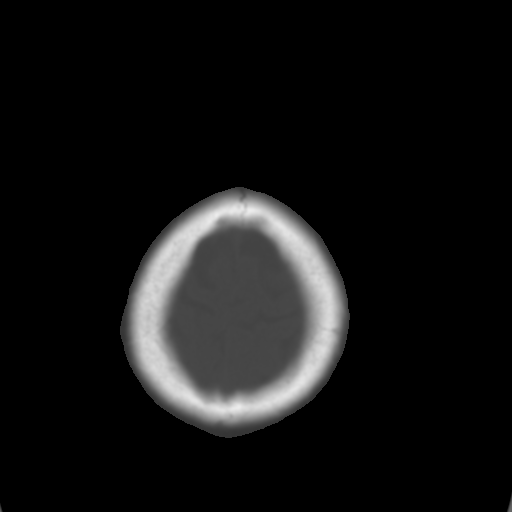
[im 30/33  brain]
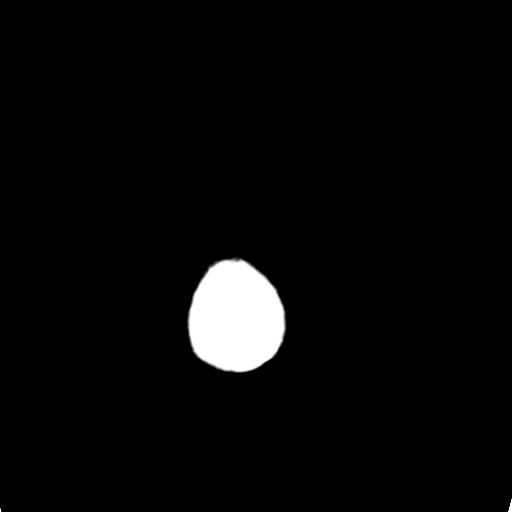

[Series 4: coronal soft tissue · coronal · 0.38mm/px · 3 of 74 slices shown]
[im 25/74  brain]
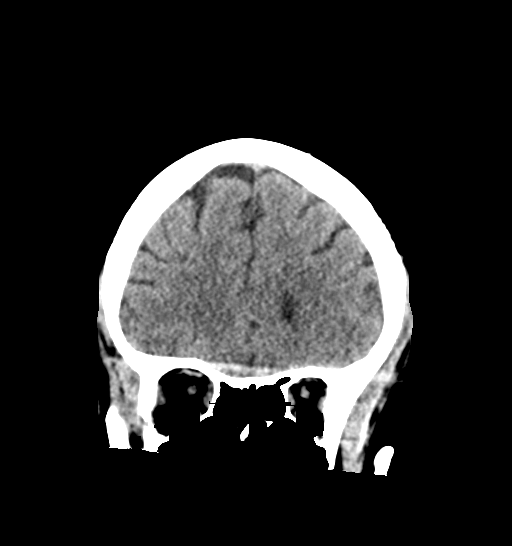
[im 33/74  brain]
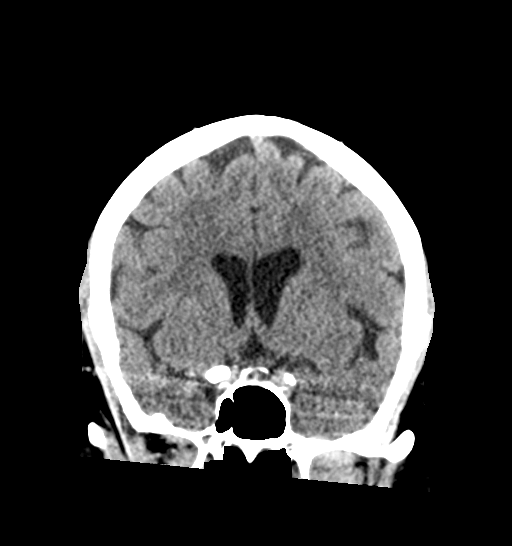
[im 41/74  brain]
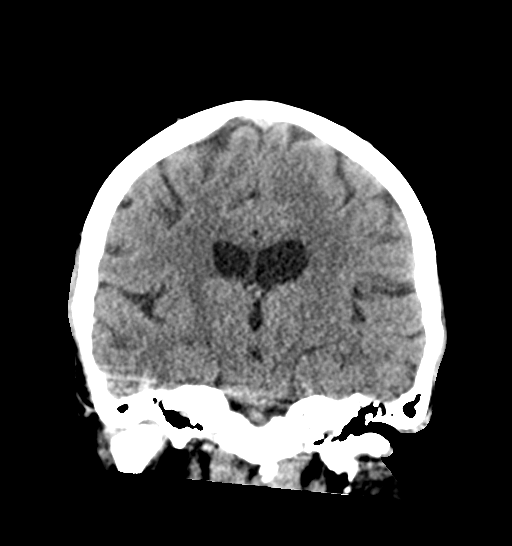

[Series 5: sagittal soft tissue · sagittal · 0.32mm/px · 3 of 67 slices shown]
[im 23/67  brain]
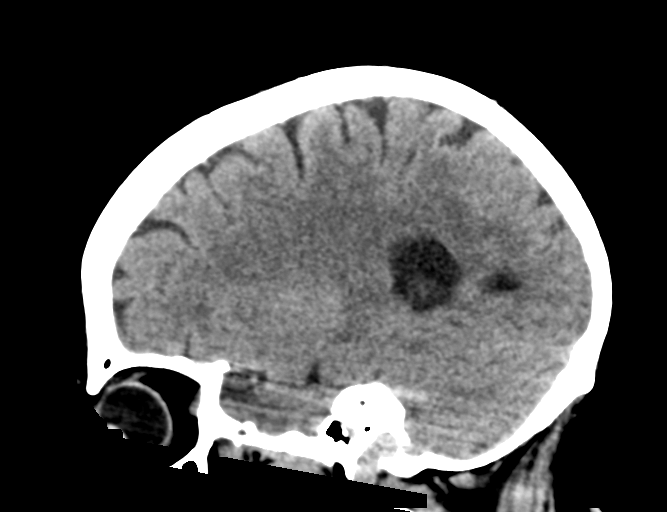
[im 34/67  brain]
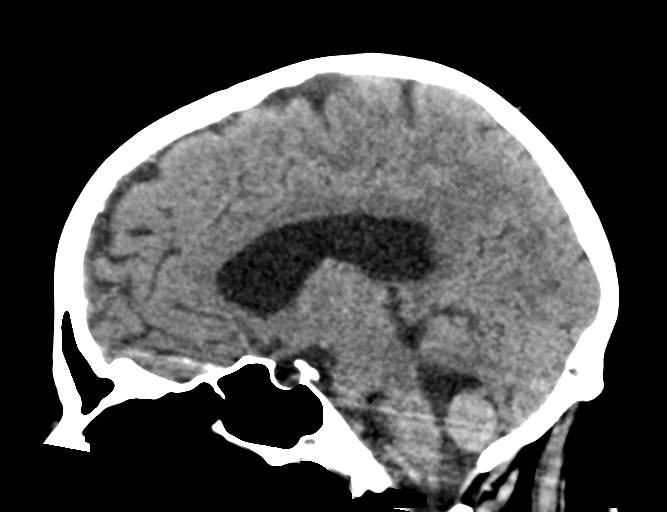
[im 45/67  brain]
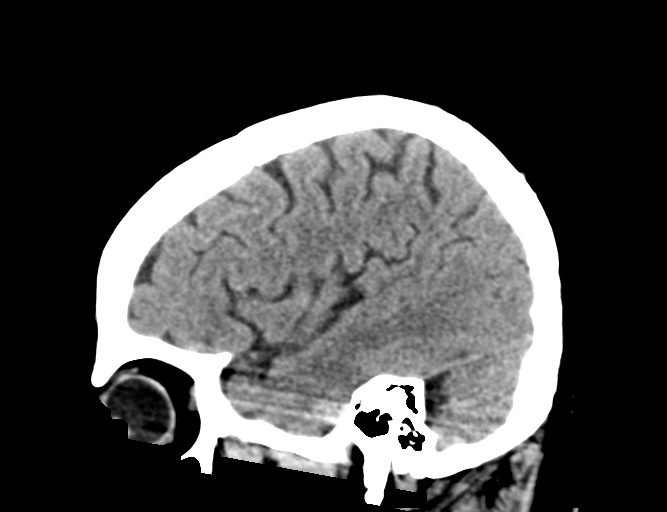

[16 of 47 positions shown; findings below may reference images not displayed]

FINDINGS: Brain: Normal ventricular morphology. No midline shift or mass
effect. Minimal white matter hypoattenuation question chronic
ischemic changes of deep cerebral white matter. Otherwise normal
appearance of brain parenchyma. No intracranial hemorrhage, mass
lesion, evidence of acute infarction, or extra-axial fluid
collection.

Vascular: Normal appearance

Skull: Intact

Sinuses/Orbits: Clear

Other: N/A
IMPRESSION: Question minimal small vessel chronic ischemic changes of deep
cerebral white matter.

No acute intracranial abnormalities.

## 2020-04-15 ENCOUNTER — Other Ambulatory Visit (HOSPITAL_COMMUNITY): Payer: Self-pay | Admitting: Internal Medicine

## 2020-04-15 DIAGNOSIS — Z1231 Encounter for screening mammogram for malignant neoplasm of breast: Secondary | ICD-10-CM

## 2020-04-16 ENCOUNTER — Other Ambulatory Visit: Payer: Self-pay

## 2020-04-16 ENCOUNTER — Ambulatory Visit (HOSPITAL_COMMUNITY)
Admission: RE | Admit: 2020-04-16 | Discharge: 2020-04-16 | Disposition: A | Discharge status: Home / Self Care | Payer: BC Managed Care – PPO | Source: Ambulatory Visit | Attending: Internal Medicine | Admitting: Internal Medicine

## 2020-04-16 DIAGNOSIS — Z1231 Encounter for screening mammogram for malignant neoplasm of breast: Secondary | ICD-10-CM | POA: Insufficient documentation

## 2020-11-12 ENCOUNTER — Encounter: Payer: Self-pay | Admitting: Internal Medicine

## 2020-12-23 ENCOUNTER — Ambulatory Visit: Payer: BC Managed Care – PPO | Admitting: Gastroenterology

## 2020-12-23 ENCOUNTER — Encounter: Payer: Self-pay | Admitting: Internal Medicine

## 2020-12-29 ENCOUNTER — Encounter: Payer: Self-pay | Admitting: Adult Health

## 2020-12-29 ENCOUNTER — Other Ambulatory Visit (HOSPITAL_COMMUNITY)
Admission: RE | Admit: 2020-12-29 | Discharge: 2020-12-29 | Disposition: A | Discharge status: Home / Self Care | Payer: BC Managed Care – PPO | Source: Ambulatory Visit | Attending: Adult Health | Admitting: Adult Health

## 2020-12-29 ENCOUNTER — Other Ambulatory Visit: Payer: Self-pay

## 2020-12-29 ENCOUNTER — Ambulatory Visit (INDEPENDENT_AMBULATORY_CARE_PROVIDER_SITE_OTHER): Payer: BC Managed Care – PPO | Admitting: Adult Health

## 2020-12-29 VITALS — BP 137/74 | HR 69 | Ht 62.0 in | Wt 185.0 lb

## 2020-12-29 DIAGNOSIS — Z1211 Encounter for screening for malignant neoplasm of colon: Secondary | ICD-10-CM | POA: Diagnosis not present

## 2020-12-29 DIAGNOSIS — Z01419 Encounter for gynecological examination (general) (routine) without abnormal findings: Secondary | ICD-10-CM | POA: Diagnosis not present

## 2020-12-29 DIAGNOSIS — Z780 Asymptomatic menopausal state: Secondary | ICD-10-CM

## 2020-12-29 DIAGNOSIS — Z8673 Personal history of transient ischemic attack (TIA), and cerebral infarction without residual deficits: Secondary | ICD-10-CM

## 2020-12-29 LAB — HEMOCCULT GUIAC POC 1CARD (OFFICE): Fecal Occult Blood, POC: NEGATIVE

## 2020-12-29 NOTE — Patient Instructions (Signed)
Thank you for choosing our office today! We appreciate the opportunity to meet your healthcare needs. You may receive a short survey by e-mail or through MyChart. If you are happy with your care we would appreciate if you could take just a few minutes to complete the survey questions. We read all of your comments and take your feedback very seriously. Thank you again for choosing our office.  Vikkie Goeden   [facility] Team  

## 2020-12-29 NOTE — Progress Notes (Signed)
Patient ID: Shelley Frazier, female   DOB: 08/22/1965, 55 y.o.   MRN: [id number] History of Present Illness: Shelley Frazier is a 55 year old white female,married, PM in for a well woman gyn exam and pap. She had a stroke in May 2018. PCP is Dr Nevada Crane.  Current Medications, Allergies, Past Medical History, Past Surgical History, Family History and Social History were reviewed in [employer] record.     Review of Systems: Patient denies any headaches, hearing loss, fatigue, blurred vision, shortness of breath, chest pain, abdominal pain, problems with bowel movements, urination, or intercourse. No joint pain or mood swings.  Denies any vaginal bleeding    Physical Exam:BP 137/74 (BP Location: Left Arm, Patient Position: Sitting, Cuff Size: Normal)   Pulse 69   Ht '5\' 2"'$  (1.575 m)   Wt 185 lb (83.9 kg)   LMP 04/18/2013   BMI 33.84 kg/m   General:  Well developed, well nourished, no acute distress Skin:  Warm and dry Neck:  Midline trachea, normal thyroid, good ROM, no lymphadenopathy Lungs; Clear to auscultation bilaterally Breast:  No dominant palpable mass, retraction, or nipple discharge Cardiovascular: Regular rate and rhythm Abdomen:  Soft, non tender, no hepatosplenomegaly Pelvic:  External genitalia is normal in appearance, no lesions.  The vagina is normal in appearance. Urethra has no lesions or masses. The cervix is bulbous.Pap with HR HPV genotyping performed.  Uterus is felt to be normal size, shape, and contour.  No adnexal masses or tenderness noted.Bladder is non tender, no masses felt. Rectal: Good sphincter tone, no polyps, or hemorrhoids felt.  Hemoccult negative. Extremities/musculoskeletal:  No swelling or varicosities noted, no clubbing or cyanosis Psych:  No mood changes, alert and cooperative,seems happy AA is 0  Fall risk is low Depression screen [facility] 2/9 12/29/2020 07/28/2016  Decreased Interest 0 0  Down, Depressed, Hopeless 0 0  PHQ - 2 Score 0 0   Altered sleeping 0 -  Tired, decreased energy 0 -  Change in appetite 0 -  Feeling bad or failure about yourself  0 -  Trouble concentrating 0 -  Moving slowly or fidgety/restless 0 -  Suicidal thoughts 0 -  PHQ-9 Score 0 -    GAD 7 : Generalized Anxiety Score 12/29/2020  Nervous, Anxious, on Edge 0  Control/stop worrying 0  Worry too much - different things 0  Trouble relaxing 0  Restless 0  Easily annoyed or irritable 0  Afraid - awful might happen 0  Total GAD 7 Score 0      Upstream - 12/29/20 1338       Pregnancy Intention Screening   Does the patient want to become pregnant in the next year? N/A    Does the patient's partner want to become pregnant in the next year? N/A    Would the patient like to discuss contraceptive options today? N/A      Contraception Wrap Up   Current Method Female Sterilization   pm   End Method Female Sterilization   pm   Contraception Counseling Provided No            Examination chaperoned by Celene Squibb LPN  Impression and Plan: 1. Encounter for gynecological examination with Papanicolaou smear of cervix Pap sent Physical in 1 year with PCP Pap in 3 years if normal Mammogram yearly Colonoscopy per GI, has appt 01/19/21 with [facility]   2. Encounter for screening fecal occult blood testing   3. Post-menopausal   4. History of  stroke

## 2021-01-01 LAB — CYTOLOGY - PAP
Comment: NEGATIVE
Diagnosis: NEGATIVE
High risk HPV: NEGATIVE

## 2021-01-19 ENCOUNTER — Ambulatory Visit (INDEPENDENT_AMBULATORY_CARE_PROVIDER_SITE_OTHER): Payer: BC Managed Care – PPO | Admitting: Gastroenterology

## 2021-01-19 ENCOUNTER — Other Ambulatory Visit: Payer: Self-pay

## 2021-01-19 ENCOUNTER — Encounter: Payer: Self-pay | Admitting: Gastroenterology

## 2021-01-19 ENCOUNTER — Emergency Department (HOSPITAL_COMMUNITY): Payer: BC Managed Care – PPO

## 2021-01-19 ENCOUNTER — Emergency Department (HOSPITAL_COMMUNITY)
Admission: EM | Admit: 2021-01-19 | Discharge: 2021-01-19 | Disposition: A | Discharge status: Home / Self Care | Payer: BC Managed Care – PPO | Attending: Emergency Medicine | Admitting: Emergency Medicine

## 2021-01-19 ENCOUNTER — Encounter (HOSPITAL_COMMUNITY): Payer: Self-pay | Admitting: *Deleted

## 2021-01-19 VITALS — BP 140/73 | HR 65 | Temp 96.6°F | Ht 62.0 in | Wt 184.0 lb

## 2021-01-19 DIAGNOSIS — W01198A Fall on same level from slipping, tripping and stumbling with subsequent striking against other object, initial encounter: Secondary | ICD-10-CM | POA: Insufficient documentation

## 2021-01-19 DIAGNOSIS — I110 Hypertensive heart disease with heart failure: Secondary | ICD-10-CM | POA: Insufficient documentation

## 2021-01-19 DIAGNOSIS — Z79899 Other long term (current) drug therapy: Secondary | ICD-10-CM | POA: Diagnosis not present

## 2021-01-19 DIAGNOSIS — I5032 Chronic diastolic (congestive) heart failure: Secondary | ICD-10-CM | POA: Insufficient documentation

## 2021-01-19 DIAGNOSIS — Z7902 Long term (current) use of antithrombotics/antiplatelets: Secondary | ICD-10-CM | POA: Diagnosis not present

## 2021-01-19 DIAGNOSIS — K760 Fatty (change of) liver, not elsewhere classified: Secondary | ICD-10-CM | POA: Diagnosis not present

## 2021-01-19 DIAGNOSIS — Z1211 Encounter for screening for malignant neoplasm of colon: Secondary | ICD-10-CM | POA: Diagnosis not present

## 2021-01-19 DIAGNOSIS — K219 Gastro-esophageal reflux disease without esophagitis: Secondary | ICD-10-CM

## 2021-01-19 DIAGNOSIS — E119 Type 2 diabetes mellitus without complications: Secondary | ICD-10-CM | POA: Insufficient documentation

## 2021-01-19 DIAGNOSIS — S42214A Unspecified nondisplaced fracture of surgical neck of right humerus, initial encounter for closed fracture: Secondary | ICD-10-CM | POA: Insufficient documentation

## 2021-01-19 DIAGNOSIS — S4991XA Unspecified injury of right shoulder and upper arm, initial encounter: Secondary | ICD-10-CM | POA: Diagnosis present

## 2021-01-19 MED ORDER — ONDANSETRON 4 MG PO TBDP: 4.0000 mg | ORAL_TABLET | Freq: Three times a day (TID) | ORAL | 0 refills | Status: DC | PRN

## 2021-01-19 MED ORDER — OXYCODONE-ACETAMINOPHEN 5-325 MG PO TABS: 1.0000 | ORAL_TABLET | Freq: Four times a day (QID) | ORAL | 0 refills | Status: DC | PRN

## 2021-01-19 NOTE — ED Triage Notes (Signed)
[place] this am, pain in right shoulder

## 2021-01-19 NOTE — ED Provider Notes (Signed)
[facility] EMERGENCY DEPARTMENT Provider Note   CSN: [id number] Arrival date & time: 01/19/21  1318     History No chief complaint on file.   Shelley Frazier is a 55 y.o. female with a history including diabetes, hypertension, history of CVA which has left her with some residual right lower extremity weakness presenting for evaluation of a fall which occurred around 9 AM this morning.  She has an occasional trip and fall secondary to her right leg weakness which caused this morning's event.  She fell onto pavement on her right side and striking her shoulder against the pavement.  She proceeded to work but had persistent pain in the shoulder so presents here.  She denies weakness or numbness distal to the injury site.  And she denies any other injuries including head injury.  She denies headache, neck pain, back pain.  She has taken 2 Aleve tablets which helped somewhat with her symptoms.  She is predominantly right-handed, but has learned to use her left hand since her stroke.  The history is provided by the patient.      Past Medical History:  Diagnosis Date   Back pain    Diabetes mellitus without complication ([place])    Fatty liver    Gallstones    High cholesterol    Hot flashes 07/30/2013   Hypertension    Obesity    Perimenopausal symptoms 07/30/2013   Stroke ([place]) 2018    Patient Active Problem List   Diagnosis Date Noted   GERD (gastroesophageal reflux disease) 01/19/2021   Encounter for screening colonoscopy 01/19/2021   Fatty liver 01/19/2021   Encounter for screening fecal occult blood testing 12/29/2020   Encounter for gynecological examination with Papanicolaou smear of cervix 12/29/2020   Post-menopausal 12/29/2020   History of stroke 12/29/2020   Hypoglycemia    Hypertriglyceridemia 10/11/2016   Diarrhea due to malabsorption    Benign essential HTN    Transaminitis    Acute ischemic stroke ([facility])    Hyperglycemia    Chronic diastolic congestive heart  failure ([facility])    Right hemiparesis ([facility])    Dysarthria, post-stroke    Poorly controlled diabetes mellitus ([place])    Dyslipidemia    Mixed hyperlipidemia    Basilar artery stenosis    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[place]) 10/01/2016   DM (diabetes mellitus), type 2 with neurological complications ([place]) 0000000   Hot flashes 07/30/2013   Hypertension 07/30/2013   Perimenopausal symptoms 07/30/2013    Past Surgical History:  Procedure Laterality Date   TONSILLECTOMY AND ADENOIDECTOMY     TUBAL LIGATION       OB History     Gravida  2   Para  2   Term  1   Preterm  1   AB      Living  2      SAB      IAB      Ectopic      Multiple      Live Births  2           Family History  Problem Relation Age of Onset   Hypertension Mother    Gallbladder disease Mother    Diabetes Father    Hypertension Father    Diabetes Maternal Grandfather    Heart disease Paternal Grandmother    Diabetes Paternal Grandmother    Gallbladder disease Maternal Aunt    Heart disease Paternal Aunt    Heart disease Paternal Uncle    Colon  cancer Neg Hx     Social History   Tobacco Use   Smoking status: Never   Smokeless tobacco: Never  Vaping Use   Vaping Use: Never used  Substance Use Topics   Alcohol use: Yes    Comment: occ   Drug use: No    Home Medications Prior to Admission medications   Medication Sig Start Date End Date Taking? Authorizing Provider  ondansetron (ZOFRAN ODT) 4 MG disintegrating tablet Take 1 tablet (4 mg total) by mouth every 8 (eight) hours as needed for nausea or vomiting. 01/19/21  Yes Darl Kuss, Almyra Free, PA-C  oxyCODONE-acetaminophen (PERCOCET/ROXICET) 5-325 MG tablet Take 1 tablet by mouth every 6 (six) hours as needed. 01/19/21  Yes Rivky Clendenning, Almyra Free, PA-C  acetaminophen (TYLENOL) 500 MG tablet Take 1,000 mg by mouth every 6 (six) hours as needed.    [provider]  amLODipine (NORVASC) 10 MG tablet Take 10 mg by mouth daily.    [provider]   atorvastatin (LIPITOR) 80 MG tablet Take 1 tablet (80 mg total) by mouth daily at 6 PM. Patient taking differently: Take 80 mg by mouth every morning. 10/12/16   Love, Ivan Anchors, PA-C  clopidogrel (PLAVIX) 75 MG tablet Take 1 tablet (75 mg total) by mouth daily. 10/11/16   Love, Ivan Anchors, PA-C  hydrochlorothiazide (MICROZIDE) 12.5 MG capsule Take 12.5 mg by mouth daily.    [provider]  Multiple Vitamin (MULTIVITAMIN) tablet Take 1 tablet by mouth daily.    [provider]  olmesartan (BENICAR) 40 MG tablet Take 40 mg by mouth daily.    [provider]  pantoprazole (PROTONIX) 40 MG tablet Take 40 mg by mouth as needed.    [provider]  SYNJARDY XR 25-1000 MG TB24 Take 1 tablet by mouth daily. 12/02/17   [provider]    Allergies    Metformin and related and Vicodin [hydrocodone-acetaminophen]  Review of Systems   Review of Systems  Constitutional:  Negative for chills and fever.  Musculoskeletal:  Positive for arthralgias. Negative for joint swelling and myalgias.  Neurological:  Negative for weakness and numbness.  All other systems reviewed and are negative.  Physical Exam Updated Vital Signs BP (!) 156/80 (BP Location: Left Arm)   Pulse 99   Temp 98.5 F (36.9 C) (Oral)   Resp 16   Ht '5\' 2"'$  (1.575 m)   Wt 83.5 kg   LMP 04/18/2013   SpO2 100%   BMI 33.65 kg/m   Physical Exam Constitutional:      Appearance: She is well-developed.  HENT:     Head: Atraumatic.  Cardiovascular:     Comments: Pulses equal bilaterally Musculoskeletal:        General: Tenderness present.     Right shoulder: Bony tenderness present. No deformity or effusion. Decreased range of motion. Normal pulse.     Cervical back: Normal range of motion.     Comments: Equal grip strength.  Radial pulses intact.  No pain with ROM of elbow, wrist forearm.  No clavicle ttp.   Skin:    General: Skin is warm and dry.  Neurological:     Mental Status: She is  alert.     Sensory: No sensory deficit.     Motor: No weakness.     Deep Tendon Reflexes: Reflexes normal.    ED Results / Procedures / Treatments   Labs (all labs ordered are listed, but only abnormal results are displayed) Labs Reviewed - No data  to display  EKG None  Radiology DG Shoulder Right  Result Date: 01/19/2021 CLINICAL DATA:  Right shoulder pain after fall this morning. EXAM: RIGHT SHOULDER - 2+ VIEW COMPARISON:  None. FINDINGS: Cortical irregularity of the proximal humeral surgical neck with slight impaction. Irregularity of the greater tuberosity. No dislocation. Soft tissues are unremarkable. IMPRESSION: 1. Acute nondisplaced fracture of the proximal humerus surgical neck and greater tuberosity. Electronically Signed   By: Titus Dubin M.D.   On: 01/19/2021 14:22    Procedures Procedures   Medications Ordered in ED Medications - No data to display  ED Course  I have reviewed the triage vital signs and the nursing notes.  Pertinent labs & imaging results that were available during my care of the patient were reviewed by me and considered in my medical decision making (see chart for details).    MDM Rules/Calculators/A&P                           Imaging and discussed with patient.  She was placed in a sling and swath, plan to follow-up care with Dr. Aline Brochure of orthopedics.  She was prescribed hydrocodone for pain relief.  Discussed other home treatments including roles of ice and heat.  She has no headache, she did not hit her head, no nausea or vomiting, dizziness.  Patient is on Plavix.  However injury occurred over 9 hours ago with no clinical symptoms suggesting head injury.  Return precautions were outlined.  Plan follow-up with Dr. Aline Brochure. Final Clinical Impression(s) / ED Diagnoses Final diagnoses:  Closed nondisplaced fracture of surgical neck of right humerus, unspecified fracture morphology, initial encounter    Rx / DC Orders ED Discharge  Orders          Ordered    oxyCODONE-acetaminophen (PERCOCET/ROXICET) 5-325 MG tablet  Every 6 hours PRN        01/19/21 1835    ondansetron (ZOFRAN ODT) 4 MG disintegrating tablet  Every 8 hours PRN        01/19/21 1835             Landis Martins 01/19/21 Vernetta Honey, MD 01/19/21 [phone]

## 2021-01-19 NOTE — Progress Notes (Signed)
Primary Care Physician:  Celene Squibb, MD  Primary Gastroenterologist:  Garfield Cornea, MD   Chief Complaint  Patient presents with   Gastroesophageal Reflux    Taking Protonix 2-3x/week to prevent reflux--helping.    HPI:  Shelley Frazier is a 55 y.o. female here at the request of Dr. Nevada Crane for consideration of colonoscopy, GERD.  Patient states she has a history of reflux off and on for a while.  Several months ago she was started on pantoprazole 40 mg daily, subsequently has decreased about 3 times per week.  This is now controlling her reflux.  She denies any alarm symptoms such as nausea/vomiting, abdominal pain, dysphagia, unintentional weight loss.  She has never had a colonoscopy.  Bowel movements are regular.  No blood in stool or melena.  She has a history of work-up several years back for elevated LFTs.  Ultrasound ordered by her PCP which showed hepatic steatosis.  Now followed by Dr. Nevada Crane.  Labs included in his office note from May showed ALT minimally elevated at 35 but other LFTs normal.  Hemoglobin 13.6, platelets 264,000, white blood cell count 7600, creatinine 1.13, BUN 15.   Current Outpatient Medications  Medication Sig Dispense Refill   acetaminophen (TYLENOL) 500 MG tablet Take 1,000 mg by mouth every 6 (six) hours as needed.     amLODipine (NORVASC) 10 MG tablet Take 10 mg by mouth daily.     atorvastatin (LIPITOR) 80 MG tablet Take 1 tablet (80 mg total) by mouth daily at 6 PM. (Patient taking differently: Take 80 mg by mouth every morning.) 30 tablet 0   clopidogrel (PLAVIX) 75 MG tablet Take 1 tablet (75 mg total) by mouth daily. 30 tablet 0   hydrochlorothiazide (MICROZIDE) 12.5 MG capsule Take 12.5 mg by mouth daily.     Multiple Vitamin (MULTIVITAMIN) tablet Take 1 tablet by mouth daily.     olmesartan (BENICAR) 40 MG tablet Take 40 mg by mouth daily.     pantoprazole (PROTONIX) 40 MG tablet Take 40 mg by mouth as needed.     SYNJARDY XR 25-1000 MG TB24 Take 1  tablet by mouth daily.  2   No current facility-administered medications for this visit.    Allergies as of 01/19/2021 - Review Complete 01/19/2021  Allergen Reaction Noted   Metformin and related  10/05/2016   Vicodin [hydrocodone-acetaminophen] Nausea And Vomiting 10/20/2012    Past Medical History:  Diagnosis Date   Back pain    Diabetes mellitus without complication ([place])    Fatty liver    Gallstones    High cholesterol    Hot flashes 07/30/2013   Hypertension    Obesity    Perimenopausal symptoms 07/30/2013   Stroke [facility])     Past Surgical History:  Procedure Laterality Date   TONSILLECTOMY AND ADENOIDECTOMY     TUBAL LIGATION      Family History  Problem Relation Age of Onset   Hypertension Mother    Gallbladder disease Mother    Diabetes Father    Hypertension Father    Diabetes Maternal Grandfather    Heart disease Paternal Grandmother    Diabetes Paternal Grandmother    Gallbladder disease Maternal Aunt    Heart disease Paternal Aunt    Heart disease Paternal Uncle    Colon cancer Neg Hx     Social History   Socioeconomic History   Marital status: Married    Spouse name: Not on file   Number of children: Not on  file   Years of education: Not on file   Highest education level: Not on file  Occupational History   Not on file  Tobacco Use   Smoking status: Never   Smokeless tobacco: Never  Vaping Use   Vaping Use: Never used  Substance and Sexual Activity   Alcohol use: Yes    Comment: occ   Drug use: No   Sexual activity: Yes    Birth control/protection: Surgical, Post-menopausal    Comment: tubal  Other Topics Concern   Not on file  Social History Narrative   Lives at home with husband and 2 adult children.  Works for [employer]. Education Some college.     Social Determinants of Health   Financial Resource Strain: Low Risk    Difficulty of Paying Living Expenses: Not hard at all  Food Insecurity: No Food Insecurity   Worried About  Charity fundraiser in the Last Year: Never true   [place] in the Last Year: Never true  Transportation Needs: No Transportation Needs   Lack of Transportation (Medical): No   Lack of Transportation (Non-Medical): No  Physical Activity: Insufficiently Active   Days of Exercise per Week: 2 days   Minutes of Exercise per Session: 30 min  Stress: No Stress Concern Present   Feeling of Stress : Not at all  Social Connections: Moderately Integrated   Frequency of Communication with Friends and Family: More than three times a week   Frequency of Social Gatherings with Friends and Family: More than three times a week   Attends Religious Services: 1 to 4 times per year   Active Member of [employer] or Organizations: No   Attends Music therapist: Never   Marital Status: Married  Human resources officer Violence: Not At Risk   Fear of Current or Ex-Partner: No   Emotionally Abused: No   Physically Abused: No   Sexually Abused: No      ROS:  General: Negative for anorexia, weight loss, fever, chills, fatigue, weakness. Eyes: Negative for vision changes.  ENT: Negative for hoarseness, difficulty swallowing , nasal congestion. CV: Negative for chest pain, angina, palpitations, dyspnea on exertion, peripheral edema.  Respiratory: Negative for dyspnea at rest, dyspnea on exertion, cough, sputum, wheezing.  GI: See history of present illness. GU:  Negative for dysuria, hematuria, urinary incontinence, urinary frequency, nocturnal urination.  MS: Negative for joint pain, low back pain.  History of sciatica issues. Derm: Negative for rash or itching.  Neuro: Negative for weakness, abnormal sensation, seizure, frequent headaches, memory loss, confusion.  Post stroke in 2018, occasionally has issues with leg/arm Psych: Negative for anxiety, depression, suicidal ideation, hallucinations.  Endo: Negative for unusual weight change.  Heme: Negative for bruising or bleeding. Allergy:  Negative for rash or hives.    Physical Examination:  BP 140/73   Pulse 65   Temp (!) 96.6 F (35.9 C) (Temporal)   Ht '5\' 2"'$  (1.575 m)   Wt 184 lb (83.5 kg)   LMP 04/18/2013   BMI 33.65 kg/m    General: Well-nourished, well-developed in no acute distress.  Head: Normocephalic, atraumatic.   Eyes: Conjunctiva pink, no icterus. Mouth: masked Neck: Supple without thyromegaly, masses, or lymphadenopathy.  Lungs: Clear to auscultation bilaterally.  Heart: Regular rate and rhythm, no murmurs rubs or gallops.  Abdomen: Bowel sounds are normal, nontender, nondistended, no hepatosplenomegaly or masses, no abdominal bruits or    hernia , no rebound or guarding.   Rectal: not  performed Extremities: No lower extremity edema. No clubbing or deformities.  Neuro: Alert and oriented x 4 , grossly normal neurologically.  Skin: Warm and dry, no rash or jaundice.   Psych: Alert and cooperative, normal mood and affect.  Labs: See hpi  Imaging Studies: No results found.   Assessment:  Pleasant 55 year old female presenting for further evaluation of GERD, schedule screening colonoscopy.  She also has history of fatty liver.  GERD is now well controlled on pantoprazole.  She takes 3 times a week.  No alarm symptoms.  Reinforced antireflux measures.  No indication for endoscopy at this time and patient does not desire one either.   Fatty liver: Noted on ultrasound in 2017.  Minimally elevated ALT.  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  She is followed by PCP for this.  Encouraged her to tightly control her sugars, avoid weight gain, exercise regularly.  She should have her LFTs checked at least twice per year.  If LFTs significantly change, would consider updating ultrasound.  She can continue to follow with PCP unless further assistance is needed.  Screening for colon cancer: patient has never had colonoscopy. Average risk screening colonoscopy recommended.    Plan:  Continue  pantoprazole 40 mg daily as needed. Colonoscopy in the near future with Dr. Gala Romney.  Plan for conscious sedation.  ASA III.  I have discussed the risks, alternatives, benefits with regards to but not limited to the risk of reaction to medication, bleeding, infection, perforation and the patient is agreeable to proceed. Written consent to be obtained. Continue to follow with PCP for fatty liver.  Handout provided.  Consider updating ultrasound if bump in LFTs.  Tight control of sugars.  Exercise regularly.  Avoid weight gain.

## 2021-01-19 NOTE — H&P (View-Only) (Signed)
Primary Care Physician:  Celene Squibb, MD  Primary Gastroenterologist:  Garfield Cornea, MD   Chief Complaint  Patient presents with   Gastroesophageal Reflux    Taking Protonix 2-3x/week to prevent reflux--helping.    HPI:  Shelley Frazier is a 55 y.o. female here at the request of Dr. Nevada Crane for consideration of colonoscopy, GERD.  Patient states she has a history of reflux off and on for a while.  Several months ago she was started on pantoprazole 40 mg daily, subsequently has decreased about 3 times per week.  This is now controlling her reflux.  She denies any alarm symptoms such as nausea/vomiting, abdominal pain, dysphagia, unintentional weight loss.  She has never had a colonoscopy.  Bowel movements are regular.  No blood in stool or melena.  She has a history of work-up several years back for elevated LFTs.  Ultrasound ordered by her PCP which showed hepatic steatosis.  Now followed by Dr. Nevada Crane.  Labs included in his office note from May showed ALT minimally elevated at 35 but other LFTs normal.  Hemoglobin 13.6, platelets 264,000, white blood cell count 7600, creatinine 1.13, BUN 15.   Current Outpatient Medications  Medication Sig Dispense Refill   acetaminophen (TYLENOL) 500 MG tablet Take 1,000 mg by mouth every 6 (six) hours as needed.     amLODipine (NORVASC) 10 MG tablet Take 10 mg by mouth daily.     atorvastatin (LIPITOR) 80 MG tablet Take 1 tablet (80 mg total) by mouth daily at 6 PM. (Patient taking differently: Take 80 mg by mouth every morning.) 30 tablet 0   clopidogrel (PLAVIX) 75 MG tablet Take 1 tablet (75 mg total) by mouth daily. 30 tablet 0   hydrochlorothiazide (MICROZIDE) 12.5 MG capsule Take 12.5 mg by mouth daily.     Multiple Vitamin (MULTIVITAMIN) tablet Take 1 tablet by mouth daily.     olmesartan (BENICAR) 40 MG tablet Take 40 mg by mouth daily.     pantoprazole (PROTONIX) 40 MG tablet Take 40 mg by mouth as needed.     SYNJARDY XR 25-1000 MG TB24 Take 1  tablet by mouth daily.  2   No current facility-administered medications for this visit.    Allergies as of 01/19/2021 - Review Complete 01/19/2021  Allergen Reaction Noted   Metformin and related  10/05/2016   Vicodin [hydrocodone-acetaminophen] Nausea And Vomiting 10/20/2012    Past Medical History:  Diagnosis Date   Back pain    Diabetes mellitus without complication ([place])    Fatty liver    Gallstones    High cholesterol    Hot flashes 07/30/2013   Hypertension    Obesity    Perimenopausal symptoms 07/30/2013   Stroke [facility])     Past Surgical History:  Procedure Laterality Date   TONSILLECTOMY AND ADENOIDECTOMY     TUBAL LIGATION      Family History  Problem Relation Age of Onset   Hypertension Mother    Gallbladder disease Mother    Diabetes Father    Hypertension Father    Diabetes Maternal Grandfather    Heart disease Paternal Grandmother    Diabetes Paternal Grandmother    Gallbladder disease Maternal Aunt    Heart disease Paternal Aunt    Heart disease Paternal Uncle    Colon cancer Neg Hx     Social History   Socioeconomic History   Marital status: Married    Spouse name: Not on file   Number of children: Not on  file   Years of education: Not on file   Highest education level: Not on file  Occupational History   Not on file  Tobacco Use   Smoking status: Never   Smokeless tobacco: Never  Vaping Use   Vaping Use: Never used  Substance and Sexual Activity   Alcohol use: Yes    Comment: occ   Drug use: No   Sexual activity: Yes    Birth control/protection: Surgical, Post-menopausal    Comment: tubal  Other Topics Concern   Not on file  Social History Narrative   Lives at home with husband and 2 adult children.  Works for [employer]. Education Some college.     Social Determinants of Health   Financial Resource Strain: Low Risk    Difficulty of Paying Living Expenses: Not hard at all  Food Insecurity: No Food Insecurity   Worried About  Charity fundraiser in the Last Year: Never true   [place] in the Last Year: Never true  Transportation Needs: No Transportation Needs   Lack of Transportation (Medical): No   Lack of Transportation (Non-Medical): No  Physical Activity: Insufficiently Active   Days of Exercise per Week: 2 days   Minutes of Exercise per Session: 30 min  Stress: No Stress Concern Present   Feeling of Stress : Not at all  Social Connections: Moderately Integrated   Frequency of Communication with Friends and Family: More than three times a week   Frequency of Social Gatherings with Friends and Family: More than three times a week   Attends Religious Services: 1 to 4 times per year   Active Member of [employer] or Organizations: No   Attends Music therapist: Never   Marital Status: Married  Human resources officer Violence: Not At Risk   Fear of Current or Ex-Partner: No   Emotionally Abused: No   Physically Abused: No   Sexually Abused: No      ROS:  General: Negative for anorexia, weight loss, fever, chills, fatigue, weakness. Eyes: Negative for vision changes.  ENT: Negative for hoarseness, difficulty swallowing , nasal congestion. CV: Negative for chest pain, angina, palpitations, dyspnea on exertion, peripheral edema.  Respiratory: Negative for dyspnea at rest, dyspnea on exertion, cough, sputum, wheezing.  GI: See history of present illness. GU:  Negative for dysuria, hematuria, urinary incontinence, urinary frequency, nocturnal urination.  MS: Negative for joint pain, low back pain.  History of sciatica issues. Derm: Negative for rash or itching.  Neuro: Negative for weakness, abnormal sensation, seizure, frequent headaches, memory loss, confusion.  Post stroke in 2018, occasionally has issues with leg/arm Psych: Negative for anxiety, depression, suicidal ideation, hallucinations.  Endo: Negative for unusual weight change.  Heme: Negative for bruising or bleeding. Allergy:  Negative for rash or hives.    Physical Examination:  BP 140/73   Pulse 65   Temp (!) 96.6 F (35.9 C) (Temporal)   Ht '5\' 2"'$  (1.575 m)   Wt 184 lb (83.5 kg)   LMP 04/18/2013   BMI 33.65 kg/m    General: Well-nourished, well-developed in no acute distress.  Head: Normocephalic, atraumatic.   Eyes: Conjunctiva pink, no icterus. Mouth: masked Neck: Supple without thyromegaly, masses, or lymphadenopathy.  Lungs: Clear to auscultation bilaterally.  Heart: Regular rate and rhythm, no murmurs rubs or gallops.  Abdomen: Bowel sounds are normal, nontender, nondistended, no hepatosplenomegaly or masses, no abdominal bruits or    hernia , no rebound or guarding.   Rectal: not  performed Extremities: No lower extremity edema. No clubbing or deformities.  Neuro: Alert and oriented x 4 , grossly normal neurologically.  Skin: Warm and dry, no rash or jaundice.   Psych: Alert and cooperative, normal mood and affect.  Labs: See hpi  Imaging Studies: No results found.   Assessment:  Pleasant 55 year old female presenting for further evaluation of GERD, schedule screening colonoscopy.  She also has history of fatty liver.  GERD is now well controlled on pantoprazole.  She takes 3 times a week.  No alarm symptoms.  Reinforced antireflux measures.  No indication for endoscopy at this time and patient does not desire one either.   Fatty liver: Noted on ultrasound in 2017.  Minimally elevated ALT.  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  She is followed by PCP for this.  Encouraged her to tightly control her sugars, avoid weight gain, exercise regularly.  She should have her LFTs checked at least twice per year.  If LFTs significantly change, would consider updating ultrasound.  She can continue to follow with PCP unless further assistance is needed.  Screening for colon cancer: patient has never had colonoscopy. Average risk screening colonoscopy recommended.    Plan:  Continue  pantoprazole 40 mg daily as needed. Colonoscopy in the near future with Dr. Gala Romney.  Plan for conscious sedation.  ASA III.  I have discussed the risks, alternatives, benefits with regards to but not limited to the risk of reaction to medication, bleeding, infection, perforation and the patient is agreeable to proceed. Written consent to be obtained. Continue to follow with PCP for fatty liver.  Handout provided.  Consider updating ultrasound if bump in LFTs.  Tight control of sugars.  Exercise regularly.  Avoid weight gain.

## 2021-01-19 NOTE — Discharge Instructions (Addendum)
Wear your sling at all times to protect your shoulder injury.  I recommend using ice packs for the next several days to help reduce deep tissue swelling.  Call Dr. Ruthe Mannan office tomorrow morning to schedule an appointment time for follow-up care.  You have been prescribed oxycodone which she may take for pain relief as needed.  Do not drive within 4 hours of taking this medication as it will make you drowsy.  This medication is similar to hydrocodone so may or may not cause nausea for you.  I have also prescribed you some Zofran to use for nausea if you have this side effect from this medication.

## 2021-01-19 NOTE — Patient Instructions (Addendum)
Continue pantoprazole '40mg'$  daily as needed for reflux. As long as your symptoms are controlled, there is no indication for upper endoscopy. If your symptoms are poorly controlled or you develop abdominal pain or problems swallowing, you need to consider upper endoscopy.  Colonoscopy as scheduled. See separate instructions.  I noticed you have a history of fatty liver based on previous ultrasound of your liver. You should have your liver numbers checked twice a year. Fatty liver can lead to inflammation/scarring of the liver so it is important to control your diabetes, limit alcohol use, and avoid weight gain, get regular exercise.   Fatty Liver Disease  The liver converts food into energy, removes toxic material from the blood, makes important proteins, and absorbs necessary vitamins from food. Fatty liver disease occurs when too much fat has built up in your liver cells. Fatty liverdisease is also called hepatic steatosis. In many cases, fatty liver disease does not cause symptoms or problems. It is often diagnosed when tests are being done for other reasons. However, over time, fatty liver can cause inflammation that may lead to more serious liver problems, such as scarring of the liver (cirrhosis) and liver failure. Fatty liver is associated with insulin resistance, increased body fat, high blood pressure (hypertension), and high cholesterol. These are features of metabolic syndrome and increaseyour risk for stroke, diabetes, and heart disease. What are the causes? This condition may be caused by components of metabolic syndrome: Obesity. Insulin resistance. High cholesterol. Other causes: Alcohol abuse. Poor nutrition. Cushing syndrome. Pregnancy. Certain drugs. Poisons. Some viral infections. What increases the risk? You are more likely to develop this condition if you: Abuse alcohol. Are overweight. Have diabetes. Have hepatitis. Have a high triglyceride level. Are pregnant. What  are the signs or symptoms? Fatty liver disease often does not cause symptoms. If symptoms do develop, they can include: Fatigue and weakness. Weight loss. Confusion. Nausea, vomiting, or abdominal pain. Yellowing of your skin and the white parts of your eyes (jaundice). Itchy skin. How is this diagnosed? This condition may be diagnosed by: A physical exam and your medical history. Blood tests. Imaging tests, such as an ultrasound, CT scan, or MRI. A liver biopsy. A small sample of liver tissue is removed using a needle. The sample is then looked at under a microscope. How is this treated? Fatty liver disease is often caused by other health conditions. Treatment for fatty liver may involve medicines and lifestyle changes to manage conditions such as: Alcoholism. High cholesterol. Diabetes. Being overweight or obese. Follow these instructions at home:  Do not drink alcohol. If you have trouble quitting, ask your health care provider how to safely quit with the help of medicine or a supervised program. This is important to keep your condition from getting worse. Eat a healthy diet as told by your health care provider. Ask your health care provider about working with a dietitian to develop an eating plan. Exercise regularly. This can help you lose weight and control your cholesterol and diabetes. Talk to your health care provider about an exercise plan and which activities are best for you. Take over-the-counter and prescription medicines only as told by your health care provider. Keep all follow-up visits. This is important. Contact a health care provider if: You have trouble controlling your: Blood sugar. This is especially important if you have diabetes. Cholesterol. Drinking of alcohol. Get help right away if: You have abdominal pain. You have jaundice. You have nausea and are vomiting. You vomit blood or  material that looks like coffee grounds. You have stools that are black,  tar-like, or bloody. Summary Fatty liver disease develops when too much fat builds up in the cells of your liver. Fatty liver disease often causes no symptoms or problems. However, over time, fatty liver can cause inflammation that may lead to more serious liver problems, such as scarring of the liver (cirrhosis). You are more likely to develop this condition if you abuse alcohol, are pregnant, are overweight, have diabetes, have hepatitis, or have high triglyceride or cholesterol levels. Contact your health care provider if you have trouble controlling your blood sugar, cholesterol, or drinking of alcohol. This information is not intended to replace advice given to you by your health care provider. Make sure you discuss any questions you have with your healthcare provider. Document Revised: 02/28/2020 Document Reviewed: 02/28/2020 Elsevier Patient Education  [place].    Gastroesophageal Reflux Disease, Adult Gastroesophageal reflux (GER) happens when acid from the stomach flows up into the tube that connects the mouth and the stomach (esophagus). Normally, food travels down the esophagus and stays in the stomach to be digested. However, when a person has GER, food and stomach acid sometimes move back up into the esophagus. If this becomes a more serious problem, the person may be diagnosed with a disease called gastroesophageal reflux disease (GERD). GERD occurs when the reflux: Happens often. Causes frequent or severe symptoms. Causes problems such as damage to the esophagus. When stomach acid comes in contact with the esophagus, the acid may cause inflammation in the esophagus. Over time, GERD may create small holes (ulcers) in the lining of the esophagus. What are the causes? This condition is caused by a problem with the muscle between the esophagus and the stomach (lower esophageal sphincter, or LES). Normally, the LES muscle closes after food passes through the esophagus to the  stomach. When the LES is weakened or abnormal, it does not close properly, and that allows food and stomach acid to go back up into theesophagus. The LES can be weakened by certain dietary substances, medicines, and medical conditions, including: Tobacco use. Pregnancy. Having a hiatal hernia. Alcohol use. Certain foods and beverages, such as coffee, chocolate, onions, and peppermint. What increases the risk? You are more likely to develop this condition if you: Have an increased body weight. Have a connective tissue disorder. Take NSAIDs, such as ibuprofen. What are the signs or symptoms? Symptoms of this condition include: Heartburn. Difficult or painful swallowing and the feeling of having a lump in the throat. A bitter taste in the mouth. Bad breath and having a large amount of saliva. Having an upset or bloated stomach and belching. Chest pain. Different conditions can cause chest pain. Make sure you see your health care provider if you experience chest pain. Shortness of breath or wheezing. Ongoing (chronic) cough or a nighttime cough. Wearing away of tooth enamel. Weight loss. How is this diagnosed? This condition may be diagnosed based on a medical history and a physical exam. To determine if you have mild or severe GERD, your health care provider may also monitor how you respond to treatment. You may also have tests, including: A test to examine your stomach and esophagus with a small camera (endoscopy). A test that measures the acidity level in your esophagus. A test that measures how much pressure is on your esophagus. A barium swallow or modified barium swallow test to show the shape, size, and functioning of your esophagus. How is this treated?  Treatment for this condition may vary depending on how severe your symptoms are. Your health care provider may recommend: Changes to your diet. Medicine. Surgery. The goal of treatment is to help relieve your symptoms and to  preventcomplications. Follow these instructions at home: Eating and drinking  Follow a diet as recommended by your health care provider. This may involve avoiding foods and drinks such as: Coffee and tea, with or without caffeine. Drinks that contain alcohol. Energy drinks and sports drinks. Carbonated drinks or sodas. Chocolate and cocoa. Peppermint and mint flavorings. Garlic and onions. Horseradish. Spicy and acidic foods, including peppers, chili powder, curry powder, vinegar, hot sauces, and barbecue sauce. Citrus fruit juices and citrus fruits, such as oranges, lemons, and limes. Tomato-based foods, such as red sauce, chili, salsa, and pizza with red sauce. Fried and fatty foods, such as donuts, french fries, potato chips, and high-fat dressings. High-fat meats, such as hot dogs and fatty cuts of red and white meats, such as rib eye steak, sausage, ham, and bacon. High-fat dairy items, such as whole milk, butter, and cream cheese. Eat small, frequent meals instead of large meals. Avoid drinking large amounts of liquid with your meals. Avoid eating meals during the 2-3 hours before bedtime. Avoid lying down right after you eat. Do not exercise right after you eat.  Lifestyle  Do not use any products that contain nicotine or tobacco. These products include cigarettes, chewing tobacco, and vaping devices, such as e-cigarettes. If you need help quitting, ask your health care provider. Try to reduce your stress by using methods such as yoga or meditation. If you need help reducing stress, ask your health care provider. If you are overweight, reduce your weight to an amount that is healthy for you. Ask your health care provider for guidance about a safe weight loss goal.  General instructions Pay attention to any changes in your symptoms. Take over-the-counter and prescription medicines only as told by your health care provider. Do not take aspirin, ibuprofen, or other NSAIDs unless  your health care provider told you to take these medicines. Wear loose-fitting clothing. Do not wear anything tight around your waist that causes pressure on your abdomen. Raise (elevate) the head of your bed about 6 inches (15 cm). You can use a wedge to do this. Avoid bending over if this makes your symptoms worse. Keep all follow-up visits. This is important. Contact a health care provider if: You have: New symptoms. Unexplained weight loss. Difficulty swallowing or it hurts to swallow. Wheezing or a persistent cough. A hoarse voice. Your symptoms do not improve with treatment. Get help right away if: You have sudden pain in your arms, neck, jaw, teeth, or back. You suddenly feel sweaty, dizzy, or light-headed. You have chest pain or shortness of breath. You vomit and the vomit is green, yellow, or black, or it looks like blood or coffee grounds. You faint. You have stool that is red, bloody, or black. You cannot swallow, drink, or eat. These symptoms may represent a serious problem that is an emergency. Do not wait to see if the symptoms will go away. Get medical help right away. Call your local emergency services (911 in the [place] Do not drive yourself to the hospital. Summary Gastroesophageal reflux happens when acid from the stomach flows up into the esophagus. GERD is a disease in which the reflux happens often, causes frequent or severe symptoms, or causes problems such as damage to the esophagus. Treatment for this condition may vary  depending on how severe your symptoms are. Your health care provider may recommend diet and lifestyle changes, medicine, or surgery. Contact a health care provider if you have new or worsening symptoms. Take over-the-counter and prescription medicines only as told by your health care provider. Do not take aspirin, ibuprofen, or other NSAIDs unless your health care provider told you to do so. Keep all follow-up visits as told by your health care  provider. This is important. This information is not intended to replace advice given to you by your health care provider. Make sure you discuss any questions you have with your healthcare provider. Document Revised: 11/26/2019 Document Reviewed: 11/26/2019 Elsevier Patient Education  2022 [place] for Gastroesophageal Reflux Disease, Adult When you have gastroesophageal reflux disease (GERD), the foods you eat and your eating habits are very important. Choosing the right foods can help ease the discomfort of GERD. Consider working with a dietitian to help you [employer] choices. What are tips for following this plan? Reading food labels Look for foods that are low in saturated fat. Foods that have less than 5% of daily value (DV) of fat and 0 g of trans fats may help with your symptoms. Cooking Cook foods using methods other than frying. This may include baking, steaming, grilling, or broiling. These are all methods that do not need a lot of fat for cooking. To add flavor, try to use herbs that are low in spice and acidity. Meal planning  Choose healthy foods that are low in fat, such as fruits, vegetables, whole grains, low-fat dairy products, lean meats, fish, and poultry. Eat frequent, small meals instead of three large meals each day. Eat your meals slowly, in a relaxed setting. Avoid bending over or lying down until 2-3 hours after eating. Limit high-fat foods such as fatty meats or fried foods. Limit your intake of fatty foods, such as oils, butter, and shortening. Avoid the following as told by your health care provider: Foods that cause symptoms. These may be different for different people. Keep a food diary to keep track of foods that cause symptoms. Alcohol. Drinking large amounts of liquid with meals. Eating meals during the 2-3 hours before bed.  Lifestyle Maintain a healthy weight. Ask your health care provider what weight is healthy for you. If  you need to lose weight, work with your health care provider to do so safely. Exercise for at least 30 minutes on 5 or more days each week, or as told by your health care provider. Avoid wearing clothes that fit tightly around your waist and chest. Do not use any products that contain nicotine or tobacco. These products include cigarettes, chewing tobacco, and vaping devices, such as e-cigarettes. If you need help quitting, ask your health care provider. Sleep with the head of your bed raised. Use a wedge under the mattress or blocks under the bed frame to raise the head of the bed. Chew sugar-free gum after mealtimes. What foods should I eat?  Eat a healthy, well-balanced diet of fruits, vegetables, whole grains, low-fat dairy products, lean meats, fish, and poultry. Each person is different. Foods that may trigger symptoms in one person may not trigger any symptoms in another person. Work with your health care provider to identify foods that are safe foryou. The items listed above may not be a complete list of recommended foods and beverages. Contact a dietitian for more information. What foods should I avoid? Limiting some of these foods may  help manage the symptoms of GERD. Everyone is different. Consult a dietitian or your health care provider to help youidentify the exact foods to avoid, if any. Fruits Any fruits prepared with added fat. Any fruits that cause symptoms. For some people this may include citrus fruits, such as oranges, grapefruit, pineapple,and lemons. Vegetables Deep-fried vegetables. [place] fries. Any vegetables prepared with added fat. Any vegetables that cause symptoms. For some people, this may include tomatoesand tomato products, chili peppers, onions and garlic, and horseradish. Grains Pastries or quick breads with added fat. Meats and other proteins High-fat meats, such as fatty beef or pork, hot dogs, ribs, ham, sausage, salami, and bacon. Fried meat or protein,  including fried fish and friedchicken. Nuts and nut butters, in large amounts. Dairy Whole milk and chocolate milk. Sour cream. Cream. Ice cream. Cream cheese.Milkshakes. Fats and oils Butter. Margarine. Shortening. Ghee. Beverages Coffee and tea, with or without caffeine. Carbonated beverages. Sodas. Energy drinks. Fruit juice made with acidic fruits, such as orange or grapefruit.Tomato juice. Alcoholic drinks. Sweets and desserts Chocolate and cocoa. Donuts. Seasonings and condiments Pepper. Peppermint and spearmint. Added salt. Any condiments, herbs, or seasonings that cause symptoms. For some people, this may include curry, hotsauce, or vinegar-based salad dressings. The items listed above may not be a complete list of foods and beverages to avoid. Contact a dietitian for more information. Questions to ask your health care provider Diet and lifestyle changes are usually the first steps that are taken to manage symptoms of GERD. If diet and lifestyle changes do not improve your symptoms,talk with your health care provider about taking medicines. Where to find more information International Foundation for Gastrointestinal Disorders: aboutgerd.org Summary When you have gastroesophageal reflux disease (GERD), food and lifestyle choices may be very helpful in easing the discomfort of GERD. Eat frequent, small meals instead of three large meals each day. Eat your meals slowly, in a relaxed setting. Avoid bending over or lying down until 2-3 hours after eating. Limit high-fat foods such as fatty meats or fried foods. This information is not intended to replace advice given to you by your health care provider. Make sure you discuss any questions you have with your healthcare provider. Document Revised: 11/26/2019 Document Reviewed: 11/26/2019 Elsevier Patient Education  [place].

## 2021-02-05 ENCOUNTER — Telehealth: Payer: Self-pay | Admitting: Radiology

## 2021-02-05 ENCOUNTER — Encounter: Payer: Self-pay | Admitting: Orthopedic Surgery

## 2021-02-05 ENCOUNTER — Ambulatory Visit: Payer: BC Managed Care – PPO

## 2021-02-05 ENCOUNTER — Other Ambulatory Visit: Payer: Self-pay

## 2021-02-05 ENCOUNTER — Other Ambulatory Visit: Payer: Self-pay | Admitting: Orthopedic Surgery

## 2021-02-05 ENCOUNTER — Ambulatory Visit: Payer: BC Managed Care – PPO | Admitting: Orthopedic Surgery

## 2021-02-05 VITALS — BP 130/59 | HR 64 | Ht 62.0 in | Wt 183.2 lb

## 2021-02-05 DIAGNOSIS — S42295A Other nondisplaced fracture of upper end of left humerus, initial encounter for closed fracture: Secondary | ICD-10-CM

## 2021-02-05 DIAGNOSIS — S42201A Unspecified fracture of upper end of right humerus, initial encounter for closed fracture: Secondary | ICD-10-CM

## 2021-02-05 MED ORDER — OXYCODONE-ACETAMINOPHEN 5-325 MG PO TABS: 1.0000 | ORAL_TABLET | Freq: Four times a day (QID) | ORAL | 0 refills | Status: DC | PRN

## 2021-02-05 NOTE — Progress Notes (Signed)
Patient ID: Shelley Frazier, female   DOB: December 14, 1965, 55 y.o.   MRN: [id number]  ASSESSMENT AND PLAN :  Encounter Diagnosis  Name Primary?   Closed fracture of proximal end of right humerus, initial encounter 01/19/21 Yes   Fracture proximal humerus nondisplaced repeat x-ray shows fracture stable  Start physical therapy follow-up in 2 months    Chief Complaint  Patient presents with   Arm Injury    R/ DOI 01/19/21 Walking across parking lot and fell, knee gave out.    HPI Shelley Frazier is a 55 y.o. female.  Evaluate right humerus fracture 55 years old stroke several years ago fell in the parking lot right leg gave way complains of pain right shoulder injury date August 22 proximal humerus fracture treated with a sling comes in for evaluation still complaining of pain   Review of Systems Review of Systems Right lower extremity weakness otherwise negative all parameters  Past Medical History:  Diagnosis Date   Back pain    Diabetes mellitus without complication ([place])    Fatty liver    Gallstones    High cholesterol    Hot flashes 07/30/2013   Hypertension    Obesity    Perimenopausal symptoms 07/30/2013   Stroke ([place]) 2018    Past Surgical History:  Procedure Laterality Date   TONSILLECTOMY AND ADENOIDECTOMY     TUBAL LIGATION      Family History  Problem Relation Age of Onset   Hypertension Mother    Gallbladder disease Mother    Diabetes Father    Hypertension Father    Diabetes Maternal Grandfather    Heart disease Paternal Grandmother    Diabetes Paternal Grandmother    Gallbladder disease Maternal Aunt    Heart disease Paternal Aunt    Heart disease Paternal Uncle    Colon cancer Neg Hx     Social History Social History   Tobacco Use   Smoking status: Never   Smokeless tobacco: Never  Vaping Use   Vaping Use: Never used  Substance Use Topics   Alcohol use: Yes    Comment: occ   Drug use: No    Allergies  Allergen Reactions    Metformin And Related     Unable to tolerate dose above 500 mg bid   Vicodin [Hydrocodone-Acetaminophen] Nausea And Vomiting    Current Outpatient Medications  Medication Sig Dispense Refill   acetaminophen (TYLENOL) 500 MG tablet Take 1,000 mg by mouth every 6 (six) hours as needed.     amLODipine (NORVASC) 10 MG tablet Take 10 mg by mouth daily.     atorvastatin (LIPITOR) 80 MG tablet Take 1 tablet (80 mg total) by mouth daily at 6 PM. (Patient taking differently: Take 80 mg by mouth every morning.) 30 tablet 0   clopidogrel (PLAVIX) 75 MG tablet Take 1 tablet (75 mg total) by mouth daily. 30 tablet 0   hydrochlorothiazide (MICROZIDE) 12.5 MG capsule Take 12.5 mg by mouth daily.     Multiple Vitamin (MULTIVITAMIN) tablet Take 1 tablet by mouth daily.     olmesartan (BENICAR) 40 MG tablet Take 40 mg by mouth daily.     ondansetron (ZOFRAN ODT) 4 MG disintegrating tablet Take 1 tablet (4 mg total) by mouth every 8 (eight) hours as needed for nausea or vomiting. 20 tablet 0   oxyCODONE-acetaminophen (PERCOCET/ROXICET) 5-325 MG tablet Take 1 tablet by mouth every 6 (six) hours as needed. 20 tablet 0   pantoprazole (PROTONIX) 40 MG tablet  Take 40 mg by mouth as needed.     SYNJARDY XR 25-1000 MG TB24 Take 1 tablet by mouth daily.  2   No current facility-administered medications for this visit.       Physical Exam BP (!) 130/59   Pulse 64   Ht '5\' 2"'$  (1.575 m)   Wt 183 lb 4 oz (83.1 kg)   LMP 04/18/2013   BMI 33.52 kg/m  Body mass index is 33.52 kg/m.   The patient is well developed well nourished and well groomed.  Orientation to person place and time is normal  Mood is pleasant.  Ambulatory status no devices for assistance Cervical spine exam is as follows no tenderness normal range of motion  Right shoulder  Examination: Inspection of the shoulder shows that there is ecchymosis of the skin proximal humerus down into the upper arm. Tenderness at the proximal humerus and  fracture site is noted.  Range of motion examination is deferred because of pain.  Motor exam hand wrist elbow normal including normal muscle tone.  Shoulder stability tests deferred because of fracture, elbow stable wrist stable.  Neurovascular examination is intact and the lymph nodes in the axilla and supraclavicular regions are normal   The opposite shoulder has no swelling, normal range of motion, no joint contracture subluxation atrophy tremor or skin lesion. Neurovascular exam is intact.   MEDICAL DECISION MAKING  A.  Encounter Diagnosis  Name Primary?   Closed fracture of proximal end of right humerus, initial encounter 01/19/21 Yes    B. DATA ANALYSED:  Outside x-ray shows a nondisplaced proximal humerus fracture  IMAGING: Independent interpretation of images: As above  Orders: X-ray right shoulder  X-ray shows no displacement of the fracture  Outside records reviewed: ER  C. MANAGEMENT therapy  Meds ordered this encounter  Medications   oxyCODONE-acetaminophen (PERCOCET/ROXICET) 5-325 MG tablet    Sig: Take 1 tablet by mouth every 6 (six) hours as needed.    Dispense:  20 tablet    Refill:  0

## 2021-02-05 NOTE — Telephone Encounter (Signed)
Completed a prior authorization request for Oxycodone via cover my meds

## 2021-02-05 NOTE — Patient Instructions (Addendum)
OT at [facility]   Physical therapy has been ordered for you at [facility]. They should call you to schedule, [phone] is the phone number to call, if you want to call to schedule.

## 2021-02-09 ENCOUNTER — Telehealth: Payer: Self-pay | Admitting: Radiology

## 2021-02-09 NOTE — Telephone Encounter (Signed)
Prior auth request for Oxycodone received again, did this last week, waiting for a reply

## 2021-02-10 ENCOUNTER — Telehealth: Payer: Self-pay | Admitting: Internal Medicine

## 2021-02-10 NOTE — Telephone Encounter (Signed)
Spoke with pt and informed her that a right arm fracture should not be a problem for her upcoming colonoscopy. Pt verbalized understanding and is still willing to go through with the procedure at this time.

## 2021-02-10 NOTE — Telephone Encounter (Signed)
PATIENT HAS UPCOMING PROCEDURE AND HAS A FRACTURED RIGHT ARM, SHE WANTS TO KNOW WHAT SIDE SHE WILL BE LAYING ON WHEN THEY DO HER PROCEDURE

## 2021-02-10 NOTE — Telephone Encounter (Signed)
Pt is scheduled for a colonoscopy on 02/18/2021.

## 2021-02-11 ENCOUNTER — Ambulatory Visit (HOSPITAL_COMMUNITY): Payer: BC Managed Care – PPO | Attending: Orthopedic Surgery

## 2021-02-11 ENCOUNTER — Encounter (HOSPITAL_COMMUNITY): Payer: Self-pay

## 2021-02-11 ENCOUNTER — Other Ambulatory Visit: Payer: Self-pay

## 2021-02-11 DIAGNOSIS — R29898 Other symptoms and signs involving the musculoskeletal system: Secondary | ICD-10-CM | POA: Diagnosis not present

## 2021-02-11 DIAGNOSIS — M25511 Pain in right shoulder: Secondary | ICD-10-CM | POA: Insufficient documentation

## 2021-02-11 DIAGNOSIS — M25611 Stiffness of right shoulder, not elsewhere classified: Secondary | ICD-10-CM | POA: Diagnosis present

## 2021-02-11 NOTE — Patient Instructions (Signed)
Complete 2-3 times a day.   1) SHOULDER: Flexion On Table   Place hands on towel placed on table, elbows straight. Lean forward with you upper body, pushing towel away from body.  __10_ reps per set, ___ sets per day  2) Abduction (Passive)   With arm out to side, resting on towel placed on table with palm DOWN, keeping trunk away from table, lean to the side while pushing towel away from body.  Repeat __10__ times. Do ____ sessions per day.  Copyright  VHI. All rights reserved.     3) Internal Rotation (Assistive)   Seated with elbow bent at right angle and held against side, slide arm on table surface in an inward arc keeping elbow anchored in place. Repeat __10__ times. Do ____ sessions per day. Activity: Use this motion to brush crumbs off the table.    1) Seated Row   Sit up straight with elbows by your sides. Pull back with shoulders/elbows, keeping forearms straight, as if pulling back on the reins of a horse. Squeeze shoulder blades together. Repeat _10-15__times, __2-3__sets/day

## 2021-02-12 ENCOUNTER — Encounter (HOSPITAL_COMMUNITY): Payer: Self-pay | Admitting: Occupational Therapy

## 2021-02-12 ENCOUNTER — Ambulatory Visit (HOSPITAL_COMMUNITY): Payer: BC Managed Care – PPO | Admitting: Occupational Therapy

## 2021-02-12 DIAGNOSIS — M25611 Stiffness of right shoulder, not elsewhere classified: Secondary | ICD-10-CM

## 2021-02-12 DIAGNOSIS — R29898 Other symptoms and signs involving the musculoskeletal system: Secondary | ICD-10-CM

## 2021-02-12 DIAGNOSIS — M25511 Pain in right shoulder: Secondary | ICD-10-CM

## 2021-02-12 NOTE — Therapy (Signed)
[place], [place], [address] Phone: [phone]   Fax:  [phone]  Occupational Therapy Evaluation  Patient Details  Name: Shelley Frazier MRN: [id number] Date of Birth: 29-Jul-1965 Referring Provider (OT): Arther Abbott, MD   Encounter Date: 02/11/2021   OT End of Session - 02/12/21 1138     Visit Number 1    Number of Visits 12    Date for OT Re-Evaluation 03/25/21    Authorization Type [place] Time Period no co insurance. No visit limit $25 copy no auth needed    OT Start Time 1345    OT Stop Time 1430    OT Time Calculation (min) 45 min    Activity Tolerance Patient tolerated treatment well    Behavior During Therapy WFL for tasks assessed/performed             Past Medical History:  Diagnosis Date   Back pain    Diabetes mellitus without complication ([place])    Fatty liver    Gallstones    High cholesterol    Hot flashes 07/30/2013   Hypertension    Obesity    Perimenopausal symptoms 07/30/2013   Stroke ([place]) 2018    Past Surgical History:  Procedure Laterality Date   TONSILLECTOMY AND ADENOIDECTOMY     TUBAL LIGATION      There were no vitals filed for this visit.   Subjective Assessment - 02/11/21 1350     Subjective  S: I fell in the parking lot. My knee gave out.    Pertinent History Patient is a 55 y/o female S/P right proximal humerus fracture which was sustained from a mechanical fall on 01/19/21. Dr. Aline Brochure has referred patient to occupational therapy for evaluation and treatment.    Patient Stated Goals To regain her strength and use of her RUE.    Currently in Pain? Yes    Pain Score 6     Pain Location Shoulder    Pain Orientation Right    Pain Descriptors / Indicators Aching;Dull    Pain Type Acute pain    Pain Radiating Towards shoulder to elbow    Pain Onset More than a month ago    Pain Frequency Occasional    Aggravating  Factors  working on the computer and trying to reach for mouse or keyboard, too much pressure, sleeping    Pain Relieving Factors Advil, Oxycodone, ice and heat, sling at night    Effect of Pain on Daily Activities unable to use her right UE for any daily tasks. Severe effect               [facility] OT Assessment - 02/11/21 1352       Assessment   Medical Diagnosis right shoulder proximal humerus fracture    Referring Provider (OT) Arther Abbott, MD    Onset Date/Surgical Date 01/19/21    Hand Dominance Right    Next MD Visit 04/02/21    Prior Therapy Prior PT and OT In 2018 after CVA      Precautions   Precautions Shoulder    Type of Shoulder Precautions Follow standard proximal humerus fracture protocol. Week 3-5 Begin AA/ROM if pain is diminished. Week 6-8 A/ROM Week 7-8 shoulder stretches. Week 8-10 scapular strengthening with band, light weight strengthening.    Shoulder Interventions Shoulder sling/immobilizer   when out in the community.     Restrictions   Weight Bearing Restrictions  No      Balance Screen   Has the patient fallen in the past 6 months Yes    How many times? 1    Has the patient had a decrease in activity level because of a fear of falling?  No    Is the patient reluctant to leave their home because of a fear of falling?  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      Mobility   Mobility Status Independent      Written Expression   Dominant Hand Right      Vision - History   Baseline Vision Wears glasses all the time      Cognition   Overall Cognitive Status Within Functional Limits for tasks assessed       Observation/Other Assessments   Focus on Therapeutic Outcomes (FOTO)  41/100      Posture/Postural Control   Posture/Postural Control No significant limitations      ROM / Strength   AROM / PROM / Strength AROM;Strength;PROM      Palpation   Palpation comment Max fascial restrictions palpated in the right upper arm, upper trapezius, scapularis region.      AROM   Overall AROM  Unable to assess;Due to precautions;Due to pain      PROM   Overall PROM Comments Assessed supine. IR/er adducted    PROM Assessment Site Shoulder    Right/Left Shoulder Right    Right Shoulder Flexion 100 Degrees    Right Shoulder ABduction 110 Degrees    Right Shoulder Internal Rotation 80 Degrees    Right Shoulder External Rotation 25 Degrees      Strength   Overall Strength Unable to assess;Due to precautions;Due to pain                              OT Education - 02/11/21 1545     Education Details table slides. Seated rows A/ROM    Person(s) Educated Patient    Methods Explanation;Demonstration;Handout    Comprehension Returned demonstration;Verbalized understanding              OT Short Term Goals - 02/12/21 1147       OT SHORT TERM GOAL #1   Title Patient will be educated and independent with HEP to increase functional use of RUE as dominant with all tasks 75% of the time.     Time 6    Period Weeks    Status New    Target Date 03/25/21      OT SHORT TERM GOAL #2   Title Patient will increase her RUE A/ROM to [facility] in order to complete functional reaching tasks at or above shoulder level.    Time 6    Period Weeks    Status New      OT SHORT TERM GOAL #3   Title Patient will increase her RUE shoulder strength to 4/5 in order to return to using her RUE to complete meal prep tasks and lift and move moderate weighted  items with less difficulty.    Time 6    Period Weeks    Status New      OT SHORT TERM GOAL #4   Title Patient will report a decrease in pain  level in her RUE of approximately 3/10 or less while completing work related tasks.    Time 6    Period Weeks    Status New      OT SHORT TERM GOAL #5   Title Patient will decrease her RUE fascial restrictions to min amount or less in order to increase the functional mobility needed to complete household tasks and dressing tasks.    Time 6    Period Weeks    Status New                      Plan - 02/12/21 1140     Clinical Impression Statement A: Patient is a 55 y/o female S/P right proximal humerus fracture causing increased pain, fascial restrictions and decreased ROM and strength resulting in difficulty using her right UE as her dominant extremity during daily tasks.    OT Occupational Profile and History Problem Focused Assessment - Including review of records relating to presenting problem    Occupational performance deficits (Please refer to evaluation for details): ADL's;IADL's;Rest and Sleep;Work;Leisure    Body Structure / Function / Physical Skills ADL;UE functional use;Fascial restriction;Pain;ROM;Strength    Rehab Potential Excellent    Clinical Decision Making Limited treatment options, no task modification necessary    Comorbidities Affecting Occupational Performance: Presence of comorbidities impacting occupational performance    Comorbidities impacting occupational performance description: hx of CVA effecting the right side.    Modification or Assistance to Complete Evaluation  No modification of tasks or assist necessary to complete eval    OT Frequency 2x / week    OT Duration 6 weeks    OT Treatment/Interventions Self-care/ADL training;Ultrasound;Patient/family education;Cryotherapy;Electrical Stimulation;Moist Heat;Therapeutic exercise;Manual Therapy;Therapeutic activities;Neuromuscular education;Passive range of motion    Plan P: Patient will benefit from skilled OT services to increase functional use of her RUE during daily tasks. Treatment plan: Follow  protocol. Due to pain level and limited tolerance of P/ROM start with myofascial release, manual stretching then progress to AA/ROM, A/ROM, and general shoulder and scapular strengthening. Modalities PRN.    OT Home Exercise Plan eval: table slides, A/ROM scapular row.    Consulted and Agree with Plan of Care Patient             Patient will benefit from skilled therapeutic intervention in order to improve the following deficits and impairments:   Body Structure / Function / Physical Skills: ADL, UE functional use, Fascial restriction, Pain, ROM, Strength       Visit Diagnosis: Other symptoms and signs involving the musculoskeletal system - Plan: Ot plan of care cert/re-cert  Acute pain of right shoulder - Plan: Ot plan of care cert/re-cert  Stiffness of right shoulder, not elsewhere classified - Plan: Ot plan of care cert/re-cert    Problem List Patient Active Problem List   Diagnosis Date Noted   GERD (gastroesophageal reflux disease) 01/19/2021   Encounter for screening colonoscopy 01/19/2021   Fatty liver 01/19/2021   Encounter for screening fecal occult blood testing 12/29/2020   Encounter for gynecological examination with Papanicolaou smear of cervix 12/29/2020   Post-menopausal 12/29/2020   History of stroke 12/29/2020   Hypoglycemia    Hypertriglyceridemia 10/11/2016   Diarrhea due to malabsorption    Benign essential  HTN    Transaminitis    Acute ischemic stroke (HCC)    Hyperglycemia    Chronic diastolic congestive heart failure (HCC)    Right hemiparesis (HCC)    Dysarthria, post-stroke    Poorly controlled diabetes mellitus ([place])    Dyslipidemia    Mixed hyperlipidemia    Basilar artery stenosis    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

## 2021-02-12 NOTE — Therapy (Signed)
[place], [place], [address] Phone: [phone]   Fax:  [phone]  Occupational Therapy Treatment  Patient Details  Name: Shelley Frazier MRN: [id number] Date of Birth: [birth year] Referring Provider (OT): Arther Abbott, MD   Encounter Date: 02/12/2021   OT End of Session - 02/12/21 2056     Visit Number 2    Number of Visits 12    Date for OT Re-Evaluation 03/25/21    Authorization Type [place] Time Period no co insurance. No visit limit $25 copy no auth needed    OT Start Time 1516    OT Stop Time 1551    OT Time Calculation (min) 35 min    Activity Tolerance Patient tolerated treatment well    Behavior During Therapy [facility] for tasks assessed/performed             Past Medical History:  Diagnosis Date   Back pain    Diabetes mellitus without complication ([place])    Fatty liver    Gallstones    High cholesterol    Hot flashes 07/30/2013   Hypertension    Obesity    Perimenopausal symptoms 07/30/2013   Stroke ([place]) 2018    Past Surgical History:  Procedure Laterality Date   TONSILLECTOMY AND ADENOIDECTOMY     TUBAL LIGATION      There were no vitals filed for this visit.   Subjective Assessment - 02/12/21 1515     Subjective  S: It's about the same as yesterday.    Currently in Pain? Yes    Pain Score 6     Pain Location Shoulder    Pain Orientation Right    Pain Descriptors / Indicators Aching;Dull    Pain Type Acute pain    Pain Radiating Towards shoulder to elbow    Pain Onset More than a month ago    Pain Frequency Occasional    Aggravating Factors  working, sleeping    Pain Relieving Factors OTC pain medication, ice and heat, sling at night    Effect of Pain on Daily Activities unable to use RUE for ADLs    Multiple Pain Sites No                [facility] OT Assessment - 02/12/21 1515       Assessment   Medical Diagnosis right shoulder  proximal humerus fracture      Precautions   Precautions Shoulder    Type of Shoulder Precautions Follow standard proximal humerus fracture protocol. Week 3-5 Begin AA/ROM if pain is diminished. Week 6-8 A/ROM Week 7-8 shoulder stretches. Week 8-10 scapular strengthening with band, light weight strengthening.    Shoulder Interventions Shoulder sling/immobilizer   when out in the community                     OT Treatments/Exercises (OP) - 02/12/21 1519       Exercises   Exercises Shoulder      Shoulder Exercises: Supine   Protraction PROM;10 reps    Horizontal ABduction PROM;10 reps    External Rotation PROM;10 reps    Internal Rotation PROM;10 reps    Flexion PROM;10 reps    ABduction PROM;10 reps      Shoulder Exercises: Seated   Extension AROM;10 reps    Row AROM;10 reps      Shoulder Exercises: Therapy Ball   Flexion 10 reps  Scaption 10 reps      Manual Therapy   Manual Therapy Myofascial release    Manual therapy comments completed separately from therapeutic exercises    Myofascial Release myofascial release and manual techniques to right upper arm, trapezius, and scapular regions to decrease pain and fascial restrictions and increase joint ROM                      OT Short Term Goals - 02/12/21 2059       OT SHORT TERM GOAL #1   Title Patient will be educated and independent with HEP to increase functional use of RUE as dominant with all tasks 75% of the time.     Time 6    Period Weeks    Status On-going    Target Date 03/25/21      OT SHORT TERM GOAL #2   Title Patient will increase her RUE A/ROM to [facility] in order to complete functional reaching tasks at or above shoulder level.    Time 6    Period Weeks    Status On-going      OT SHORT TERM GOAL #3   Title Patient will increase her RUE shoulder strength to 4/5 in order to return to using her RUE to complete meal prep tasks and lift and move moderate weighted items with less  difficulty.    Time 6    Period Weeks    Status On-going      OT SHORT TERM GOAL #4   Title Patient will report a decrease in pain level in her RUE of approximately 3/10 or less while completing work related tasks.    Time 6    Period Weeks    Status On-going      OT SHORT TERM GOAL #5   Title Patient will decrease her RUE fascial restrictions to min amount or less in order to increase the functional mobility needed to complete household tasks and dressing tasks.    Time 6    Period Weeks    Status On-going                      Plan - 02/12/21 2057     Clinical Impression Statement A: Initiated myofascial release and manual techniques to RUE to address fascial restrictions. P/ROM completed with pt tolerating slightly greater than 50% ROM with exception of er. Pt with increasing pain during session, rest breaks provided as needed for pain tolerance. Initiated therapy ball stretches and continued with scapular A/ROM. Attempted low level thumb tacks however unable to complete due to pain. Verbal cuing for form and technique.    Body Structure / Function / Physical Skills ADL;UE functional use;Fascial restriction;Pain;ROM;Strength    Plan P: Continue with myofascial release and P/ROM, add isometrics    OT Home Exercise Plan eval: table slides, A/ROM scapular row.    Consulted and Agree with Plan of Care Patient             Patient will benefit from skilled therapeutic intervention in order to improve the following deficits and impairments:   Body Structure / Function / Physical Skills: ADL, UE functional use, Fascial restriction, Pain, ROM, Strength       Visit Diagnosis: Other symptoms and signs involving the musculoskeletal system  Acute pain of right shoulder  Stiffness of right shoulder, not elsewhere classified    Problem List Patient Active Problem List   Diagnosis Date Noted   GERD (gastroesophageal  reflux disease) 01/19/2021   Encounter for  screening colonoscopy 01/19/2021   Fatty liver 01/19/2021   Encounter for screening fecal occult blood testing 12/29/2020   Encounter for gynecological examination with Papanicolaou smear of cervix 12/29/2020   Post-menopausal 12/29/2020   History of stroke 12/29/2020   Hypoglycemia    Hypertriglyceridemia 10/11/2016   Diarrhea due to malabsorption    Benign essential HTN    Transaminitis    Acute ischemic stroke [facility])    Hyperglycemia    Chronic diastolic congestive heart failure (HCC)    Right hemiparesis (HCC)    Dysarthria, post-stroke    Poorly controlled diabetes mellitus ([place])    Dyslipidemia    Mixed hyperlipidemia    Basilar artery stenosis    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

## 2021-02-16 ENCOUNTER — Other Ambulatory Visit: Payer: Self-pay

## 2021-02-16 ENCOUNTER — Ambulatory Visit (HOSPITAL_COMMUNITY): Payer: BC Managed Care – PPO | Admitting: Occupational Therapy

## 2021-02-16 ENCOUNTER — Encounter (HOSPITAL_COMMUNITY): Payer: Self-pay | Admitting: Occupational Therapy

## 2021-02-16 DIAGNOSIS — R29898 Other symptoms and signs involving the musculoskeletal system: Secondary | ICD-10-CM

## 2021-02-16 DIAGNOSIS — M25511 Pain in right shoulder: Secondary | ICD-10-CM

## 2021-02-16 DIAGNOSIS — M25611 Stiffness of right shoulder, not elsewhere classified: Secondary | ICD-10-CM

## 2021-02-16 NOTE — Patient Instructions (Addendum)
  Complete the following 2 times a day. Hold for 3 to 5  seconds. Complete 2 to 3 sets for each.   1) SHOULDER - ISOMETRIC FLEXION  Gently push your fist forward into a wall with your elbow bent.    2) SHOULDER - ISOMETRIC EXTENSION  Gently push your a bent elbow back into a wall.    3) SHOULDER - ISOMETRIC INTERNAL ROTATION   Gently press your hand into a wall using the palm side of your hand.  Maintain a bent elbow the entire time.        4) SHOULDER - ISOMETRIC ADDUCTION  Gently push your elbow into the side of your body.   5) SHOULDER - ISOMETRIC ABDUCTION  Gently push your elbow out to the side into a wall with your elbow bent.

## 2021-02-16 NOTE — Therapy (Signed)
[place], [place], [address] Phone: [phone]   Fax:  [phone]  Occupational Therapy Treatment  Patient Details  Name: Shelley Frazier MRN: [id number] Date of Birth: July 18, 1965 Referring Provider (OT): Arther Abbott, MD   Encounter Date: 02/16/2021   OT End of Session - 02/16/21 1224     Visit Number 3    Number of Visits 12    Date for OT Re-Evaluation 03/25/21    Authorization Type [place] Time Period no co insurance. No visit limit $25 copy no auth needed    OT Start Time 0815    OT Stop Time 0858    OT Time Calculation (min) 43 min    Activity Tolerance Patient tolerated treatment well    Behavior During Therapy [facility] for tasks assessed/performed             Past Medical History:  Diagnosis Date   Back pain    Diabetes mellitus without complication ([place])    Fatty liver    Gallstones    High cholesterol    Hot flashes 07/30/2013   Hypertension    Obesity    Perimenopausal symptoms 07/30/2013   Stroke ([place]) 2018    Past Surgical History:  Procedure Laterality Date   TONSILLECTOMY AND ADENOIDECTOMY     TUBAL LIGATION      There were no vitals filed for this visit.   Subjective Assessment - 02/16/21 0815     Subjective  S: A little pain today. It is worse in the morning because I don't sleep good.    Currently in Pain? Yes    Pain Score 6     Pain Location Shoulder    Pain Orientation Right    Pain Descriptors / Indicators Aching;Dull    Pain Type Acute pain    Pain Radiating Towards shoulder to elbow    Pain Onset More than a month ago    Pain Frequency Occasional    Aggravating Factors  working, sleeping    Pain Relieving Factors OTC pain meds, ice and heat, sling at night    Effect of Pain on Daily Activities unable to use R UE for ADLs.    Multiple Pain Sites No                [facility] OT Assessment - 02/16/21 0001        Assessment   Medical Diagnosis right shoulder proximal humerus fracture      Precautions   Precautions Shoulder    Type of Shoulder Precautions Follow standard proximal humerus fracture protocol. Week 3-5 Begin AA/ROM if pain is diminished. Week 6-8 A/ROM Week 7-8 shoulder stretches. Week 8-10 scapular strengthening with band, light weight strengthening.    Shoulder Interventions Shoulder sling/immobilizer   when ou tin the community                     OT Treatments/Exercises (OP) - 02/16/21 0001       Exercises   Exercises Shoulder      Shoulder Exercises: Supine   Protraction PROM;10 reps    Horizontal ABduction PROM;10 reps    External Rotation PROM;10 reps    Internal Rotation PROM;10 reps    Flexion PROM;10 reps    ABduction PROM;10 reps   near 90*     Shoulder Exercises: Seated   Extension AROM;10 reps    Row AROM;10 reps  Shoulder Exercises: Therapy Ball   Flexion 10 reps   1 to 2 second hold at end range   Scaption 10 reps   1 to 2 second hold at end range     Shoulder Exercises: Isometric Strengthening   Flexion --    Extension Supine;3X3"    External Rotation Supine;3X3"    ABduction 3X3";Supine      Manual Therapy   Manual Therapy Myofascial release    Manual therapy comments completed separately from therapeutic exercises    Myofascial Release myofascial release and manual techniques to right upper arm, trapezius, and scapular regions to decrease pain and fascial restrictions and increase joint ROM                      OT Short Term Goals - 02/12/21 2059       OT SHORT TERM GOAL #1   Title Patient will be educated and independent with HEP to increase functional use of RUE as dominant with all tasks 75% of the time.     Time 6    Period Weeks    Status On-going    Target Date 03/25/21      OT SHORT TERM GOAL #2   Title Patient will increase her RUE A/ROM to [facility] in order to complete functional reaching tasks at or above  shoulder level.    Time 6    Period Weeks    Status On-going      OT SHORT TERM GOAL #3   Title Patient will increase her RUE shoulder strength to 4/5 in order to return to using her RUE to complete meal prep tasks and lift and move moderate weighted items with less difficulty.    Time 6    Period Weeks    Status On-going      OT SHORT TERM GOAL #4   Title Patient will report a decrease in pain level in her RUE of approximately 3/10 or less while completing work related tasks.    Time 6    Period Weeks    Status On-going      OT SHORT TERM GOAL #5   Title Patient will decrease her RUE fascial restrictions to min amount or less in order to increase the functional mobility needed to complete household tasks and dressing tasks.    Time 6    Period Weeks    Status On-going                      Plan - 02/16/21 1226     Clinical Impression Statement A: Completed myoffascial release and manual techniques to R UE to address fascial restrictions. Pt often grimacing and guarding during manual techniques and P/ROM. Abduction limited to slighlty above 50% ROM. Flexion increased to nearly 75% of ROM. Isometrics initiated with pt only completing abduction, external rotation, and extension. Pt unable to complete other shoulder isometric in supine due to pain and difficulty. Ball stretches continued as well as scapular A/ROM.    Occupational performance deficits (Please refer to evaluation for details): ADL's;IADL's;Rest and Sleep;Work;Leisure    OT Treatment/Interventions Self-care/ADL training;Ultrasound;Patient/family education;Cryotherapy;Electrical Stimulation;Moist Heat;Therapeutic exercise;Manual Therapy;Therapeutic activities;Neuromuscular education;Passive range of motion    Plan P: Continue with myofascial release and P/ROM, continue isometrics progressing to flexion and adduction if possible.    OT Home Exercise Plan eval: table slides, A/ROM scapular row; 9/19: isometrics     Consulted and Agree with Plan of Care Patient  Patient will benefit from skilled therapeutic intervention in order to improve the following deficits and impairments:           Visit Diagnosis: Other symptoms and signs involving the musculoskeletal system  Acute pain of right shoulder  Stiffness of right shoulder, not elsewhere classified    Problem List Patient Active Problem List   Diagnosis Date Noted   GERD (gastroesophageal reflux disease) 01/19/2021   Encounter for screening colonoscopy 01/19/2021   Fatty liver 01/19/2021   Encounter for screening fecal occult blood testing 12/29/2020   Encounter for gynecological examination with Papanicolaou smear of cervix 12/29/2020   Post-menopausal 12/29/2020   History of stroke 12/29/2020   Hypoglycemia    Hypertriglyceridemia 10/11/2016   Diarrhea due to malabsorption    Benign essential HTN    Transaminitis    Acute ischemic stroke (HCC)    Hyperglycemia    Chronic diastolic congestive heart failure (HCC)    Right hemiparesis (HCC)    Dysarthria, post-stroke    Poorly controlled diabetes mellitus ([place])    Dyslipidemia    Mixed hyperlipidemia    Basilar artery stenosis    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[place]) 10/01/2016   DM (diabetes mellitus), type 2 with neurological complications ([place]) 70/26/3785   Hot flashes 07/30/2013   Hypertension 07/30/2013   Perimenopausal symptoms 07/30/2013   Larey Seat OT, MOT   Larey Seat, OT/L 02/16/2021, 12:31 PM  [place], [place], [address] Phone: [phone]   Fax:  [phone]  Name: MELANYE HIRALDO MRN: [id number] Date of Birth: [birth year]

## 2021-02-18 ENCOUNTER — Other Ambulatory Visit: Payer: Self-pay

## 2021-02-18 ENCOUNTER — Encounter (HOSPITAL_COMMUNITY): Payer: Self-pay | Admitting: Internal Medicine

## 2021-02-18 ENCOUNTER — Encounter (HOSPITAL_COMMUNITY)
Admission: RE | Disposition: A | Discharge status: Home / Self Care | Payer: Self-pay | Source: Home / Self Care | Attending: Internal Medicine

## 2021-02-18 ENCOUNTER — Ambulatory Visit (HOSPITAL_COMMUNITY)
Admission: RE | Admit: 2021-02-18 | Discharge: 2021-02-18 | Disposition: A | Discharge status: Home / Self Care | Payer: BC Managed Care – PPO | Attending: Internal Medicine | Admitting: Internal Medicine

## 2021-02-18 DIAGNOSIS — Z7902 Long term (current) use of antithrombotics/antiplatelets: Secondary | ICD-10-CM | POA: Insufficient documentation

## 2021-02-18 DIAGNOSIS — E119 Type 2 diabetes mellitus without complications: Secondary | ICD-10-CM | POA: Insufficient documentation

## 2021-02-18 DIAGNOSIS — K219 Gastro-esophageal reflux disease without esophagitis: Secondary | ICD-10-CM | POA: Insufficient documentation

## 2021-02-18 DIAGNOSIS — K760 Fatty (change of) liver, not elsewhere classified: Secondary | ICD-10-CM | POA: Insufficient documentation

## 2021-02-18 DIAGNOSIS — D120 Benign neoplasm of cecum: Secondary | ICD-10-CM | POA: Diagnosis not present

## 2021-02-18 DIAGNOSIS — Z8673 Personal history of transient ischemic attack (TIA), and cerebral infarction without residual deficits: Secondary | ICD-10-CM | POA: Diagnosis not present

## 2021-02-18 DIAGNOSIS — Z79899 Other long term (current) drug therapy: Secondary | ICD-10-CM | POA: Insufficient documentation

## 2021-02-18 DIAGNOSIS — Z1211 Encounter for screening for malignant neoplasm of colon: Secondary | ICD-10-CM

## 2021-02-18 DIAGNOSIS — Z888 Allergy status to other drugs, medicaments and biological substances status: Secondary | ICD-10-CM | POA: Diagnosis not present

## 2021-02-18 DIAGNOSIS — Z885 Allergy status to narcotic agent status: Secondary | ICD-10-CM | POA: Insufficient documentation

## 2021-02-18 DIAGNOSIS — Z7984 Long term (current) use of oral hypoglycemic drugs: Secondary | ICD-10-CM | POA: Insufficient documentation

## 2021-02-18 HISTORY — DX: Unspecified fracture of shaft of humerus, unspecified arm, initial encounter for closed fracture: S42.309A

## 2021-02-18 HISTORY — PX: POLYPECTOMY: SHX5525

## 2021-02-18 HISTORY — PX: COLONOSCOPY: SHX5424

## 2021-02-18 LAB — GLUCOSE, CAPILLARY
Glucose-Capillary: 64 mg/dL — ABNORMAL LOW (ref 70–99)
Glucose-Capillary: 82 mg/dL (ref 70–99)

## 2021-02-18 SURGERY — COLONOSCOPY
Anesthesia: Moderate Sedation

## 2021-02-18 MED ORDER — SODIUM CHLORIDE 0.9 % IV SOLN: INTRAVENOUS | Status: DC

## 2021-02-18 MED ORDER — DEXTROSE-NACL 5-0.9 % IV SOLN: INTRAVENOUS | Status: DC

## 2021-02-18 MED ORDER — MEPERIDINE HCL 50 MG/ML IJ SOLN
INTRAMUSCULAR | Status: AC
  Filled 2021-02-18: qty 1

## 2021-02-18 MED ORDER — MIDAZOLAM HCL 5 MG/5ML IJ SOLN
INTRAMUSCULAR | Status: DC | PRN
  Administered 2021-02-18 (×2): 2 mg via INTRAVENOUS
  Administered 2021-02-18: 1 mg via INTRAVENOUS

## 2021-02-18 MED ORDER — ONDANSETRON HCL 4 MG/2ML IJ SOLN
INTRAMUSCULAR | Status: AC
  Filled 2021-02-18: qty 2

## 2021-02-18 MED ORDER — MEPERIDINE HCL 100 MG/ML IJ SOLN
INTRAMUSCULAR | Status: DC | PRN
  Administered 2021-02-18: 40 mg via INTRAVENOUS
  Administered 2021-02-18: 10 mg via INTRAVENOUS

## 2021-02-18 MED ORDER — ONDANSETRON HCL 4 MG/2ML IJ SOLN
INTRAMUSCULAR | Status: DC | PRN
  Administered 2021-02-18: 4 mg via INTRAVENOUS

## 2021-02-18 MED ORDER — STERILE WATER FOR IRRIGATION IR SOLN
Status: DC | PRN
  Administered 2021-02-18: 100 mL

## 2021-02-18 MED ORDER — MIDAZOLAM HCL 5 MG/5ML IJ SOLN
INTRAMUSCULAR | Status: AC
  Filled 2021-02-18: qty 10

## 2021-02-18 NOTE — Interval H&P Note (Signed)
History and Physical Interval Note:  02/18/2021 9:34 AM  Shelley Frazier  has presented today for surgery, with the diagnosis of screening colonoscopy.  The various methods of treatment have been discussed with the patient and family. After consideration of risks, benefits and other options for treatment, the patient has consented to  Procedure(s) with comments: COLONOSCOPY (N/A) - 9:30am as a surgical intervention.  The patient's history has been reviewed, patient examined, no change in status, stable for surgery.  I have reviewed the patient's chart and labs.  Questions were answered to the patient's satisfaction.     Shelley Frazier  No change.  Screening colonoscopy per plan. The risks, benefits, limitations, alternatives and imponderables have been reviewed with the patient. Questions have been answered. All parties are agreeable.

## 2021-02-18 NOTE — Op Note (Signed)
[facility] Patient Name: Shelley Frazier Procedure Date: 02/18/2021 9:23 AM MRN: [id number] Date of Birth: October 30, 1965 Attending MD: Norvel Richards , MD CSN: [id number] Age: 55 Admit Type: Outpatient Procedure:                Colonoscopy Indications:              Screening for colorectal malignant neoplasm Providers:                Norvel Richards, MD, Caprice Kluver, Randa Spike, Technician Referring MD:              Medicines:                Meperidine 50 mg IV, Midazolam 5 mg IV Complications:            No immediate complications. Estimated Blood Loss:     Estimated blood loss was minimal. Procedure:                Pre-Anesthesia Assessment:                           - Prior to the procedure, a History and Physical                            was performed, and patient medications and                            allergies were reviewed. The patient's tolerance of                            previous anesthesia was also reviewed. The risks                            and benefits of the procedure and the sedation                            options and risks were discussed with the patient.                            All questions were answered, and informed consent                            was obtained. Prior Anticoagulants: The patient has                            taken no previous anticoagulant or antiplatelet                            agents. ASA Grade Assessment: II - A patient with                            mild systemic disease. After reviewing the risks  and benefits, the patient was deemed in                            satisfactory condition to undergo the procedure.                           After obtaining informed consent, the colonoscope                            was passed under direct vision. Throughout the                            procedure, the patient's blood pressure, pulse, and                             oxygen saturations were monitored continuously. The                            [id number]) scope was introduced through the                            anus and advanced to the the cecum, identified by                            appendiceal orifice and ileocecal valve. The                            colonoscopy was performed without difficulty. The                            patient tolerated the procedure well. The quality                            of the bowel preparation was adequate. Scope In: 9:52:45 AM Scope Out: 10:08:36 AM Scope Withdrawal Time: 0 hours 10 minutes 1 second  Total Procedure Duration: 0 hours 15 minutes 51 seconds  Findings:      The perianal and digital rectal examinations were normal.      Two sessile polyps were found in the colon (cecum and ascending       segment). The polyps were 4 to 5 mm in size. These polyps were removed       with a cold snare. Resection and retrieval were complete. Estimated       blood loss was minimal.      The exam was otherwise without abnormality on direct and retroflexion       views. Impression:               - Two 4 to 5 mm polyps, removed with a cold snare.                            Resected and retrieved.                           - The examination was otherwise normal on direct  and retroflexion views. Moderate Sedation:      Moderate (conscious) sedation was administered by the endoscopy nurse       and supervised by the endoscopist. The following parameters were       monitored: oxygen saturation, heart rate, blood pressure, respiratory       rate, EKG, adequacy of pulmonary ventilation, and response to care.       Total physician intraservice time was 22 minutes. Recommendation:           - Patient has a contact number available for                            emergencies. The signs and symptoms of potential                            delayed complications were discussed with  the                            patient. Return to normal activities tomorrow.                            Written discharge instructions were provided to the                            patient.                           - Resume previous diet.                           - Continue present medications.                           - Repeat colonoscopy date to be determined after                            pending pathology results are reviewed for                            surveillance based on pathology results.                           - Return to GI office (date not yet determined). Procedure Code(s):        --- Professional ---                           [phone], Colonoscopy, flexible; with removal of                            tumor(s), polyp(s), or other lesion(s) by snare                            technique                           G0500, Moderate sedation services provided by the  same physician or other qualified health care                            professional performing a gastrointestinal                            endoscopic service that sedation supports,                            requiring the presence of an independent trained                            observer to assist in the monitoring of the                            patient's level of consciousness and physiological                            status; initial 15 minutes of intra-service time;                            patient age 62 years or older (additional time may                            be reported with [id number], as appropriate) Diagnosis Code(s):        --- Professional ---                           Z12.11, Encounter for screening for malignant                            neoplasm of colon                           K63.5, Polyp of colon CPT copyright 2019 American Medical Association. All rights reserved. The codes documented in this report are preliminary and upon coder review may  be  revised to meet current compliance requirements. Cristopher Estimable. Tedford Berg, MD Norvel Richards, MD 02/18/2021 10:22:38 AM This report has been signed electronically. Number of Addenda: 0

## 2021-02-18 NOTE — Discharge Instructions (Signed)
  Colonoscopy Discharge Instructions  Read the instructions outlined below and refer to this sheet in the next few weeks. These discharge instructions provide you with general information on caring for yourself after you leave the hospital. Your doctor may also give you specific instructions. While your treatment has been planned according to the most current medical practices available, unavoidable complications occasionally occur. If you have any problems or questions after discharge, call Dr. Gala Romney at [phone]. ACTIVITY You may resume your regular activity, but move at a slower pace for the next 24 hours.  Take frequent rest periods for the next 24 hours.  Walking will help get rid of the air and reduce the bloated feeling in your belly (abdomen).  No driving for 24 hours (because of the medicine (anesthesia) used during the test).   Do not sign any important legal documents or operate any machinery for 24 hours (because of the anesthesia used during the test).  NUTRITION Drink plenty of fluids.  You may resume your normal diet as instructed by your doctor.  Begin with a light meal and progress to your normal diet. Heavy or fried foods are harder to digest and may make you feel sick to your stomach (nauseated).  Avoid alcoholic beverages for 24 hours or as instructed.  MEDICATIONS You may resume your normal medications unless your doctor tells you otherwise.  WHAT YOU CAN EXPECT TODAY Some feelings of bloating in the abdomen.  Passage of more gas than usual.  Spotting of blood in your stool or on the toilet paper.  IF YOU HAD POLYPS REMOVED DURING THE COLONOSCOPY: No aspirin products for 7 days or as instructed.  No alcohol for 7 days or as instructed.  Eat a soft diet for the next 24 hours.  FINDING OUT THE RESULTS OF YOUR TEST Not all test results are available during your visit. If your test results are not back during the visit, make an appointment with your caregiver to find out the  results. Do not assume everything is normal if you have not heard from your caregiver or the medical facility. It is important for you to follow up on all of your test results.  SEEK IMMEDIATE MEDICAL ATTENTION IF: You have more than a spotting of blood in your stool.  Your belly is swollen (abdominal distention).  You are nauseated or vomiting.  You have a temperature over 101.  You have abdominal pain or discomfort that is severe or gets worse throughout the day.   2 small polyps removed in your colon today  Further recommendations to follow pending review of pathology report  At patient request, I called Leah Arshad at [phone]-call rolled to voicemail.  I left a detailed message.

## 2021-02-19 ENCOUNTER — Encounter: Payer: Self-pay | Admitting: Internal Medicine

## 2021-02-19 LAB — SURGICAL PATHOLOGY

## 2021-02-20 ENCOUNTER — Encounter (HOSPITAL_COMMUNITY): Payer: Self-pay | Admitting: Occupational Therapy

## 2021-02-20 ENCOUNTER — Ambulatory Visit (HOSPITAL_COMMUNITY): Payer: BC Managed Care – PPO | Admitting: Occupational Therapy

## 2021-02-20 ENCOUNTER — Other Ambulatory Visit: Payer: Self-pay

## 2021-02-20 DIAGNOSIS — R29898 Other symptoms and signs involving the musculoskeletal system: Secondary | ICD-10-CM

## 2021-02-20 DIAGNOSIS — M25611 Stiffness of right shoulder, not elsewhere classified: Secondary | ICD-10-CM

## 2021-02-20 DIAGNOSIS — M25511 Pain in right shoulder: Secondary | ICD-10-CM

## 2021-02-20 NOTE — Therapy (Signed)
[place], [place], [address] Phone: [phone]   Fax:  [phone]  Occupational Therapy Treatment  Patient Details  Name: Shelley Frazier MRN: [id number] Date of Birth: 04-02-1966 Referring Provider (OT): Arther Abbott, MD   Encounter Date: 02/20/2021   OT End of Session - 02/20/21 1507     Visit Number 4    Number of Visits 12    Date for OT Re-Evaluation 03/25/21    Authorization Type [place] Time Period no co insurance. No visit limit $25 copy no auth needed    OT Start Time 1430    OT Stop Time 1508    OT Time Calculation (min) 38 min    Activity Tolerance Patient tolerated treatment well    Behavior During Therapy WFL for tasks assessed/performed             Past Medical History:  Diagnosis Date   Back pain    Diabetes mellitus without complication ([place])    Fatty liver    Gallstones    High cholesterol    Hot flashes 07/30/2013   Humerus fracture    Hypertension    Obesity    Perimenopausal symptoms 07/30/2013   Stroke ([place]) 2018    Past Surgical History:  Procedure Laterality Date   TONSILLECTOMY AND ADENOIDECTOMY     TUBAL LIGATION      There were no vitals filed for this visit.   Subjective Assessment - 02/20/21 1428     Subjective  S: I was trying to cut something so I have some pain today.    Currently in Pain? Yes    Pain Score 5     Pain Location Shoulder    Pain Orientation Right    Pain Descriptors / Indicators Sore    Pain Type Acute pain    Pain Radiating Towards shoulder to elbow    Pain Onset More than a month ago    Pain Frequency Occasional    Aggravating Factors  working    Pain Relieving Factors OTC pain medication, ice and heat    Effect of Pain on Daily Activities mod effect on ADLs    Multiple Pain Sites No                [facility] OT Assessment - 02/20/21 1428       Assessment   Medical Diagnosis right  shoulder proximal humerus fracture      Precautions   Precautions Shoulder    Type of Shoulder Precautions Follow standard proximal humerus fracture protocol. Week 3-5 Begin AA/ROM if pain is diminished. Week 6-8 A/ROM Week 7-8 shoulder stretches. Week 8-10 scapular strengthening with band, light weight strengthening.    Shoulder Interventions Shoulder sling/immobilizer   when out in community                     OT Treatments/Exercises (OP) - 02/20/21 1431       Exercises   Exercises Shoulder      Shoulder Exercises: Supine   Protraction PROM;10 reps    Horizontal ABduction PROM;10 reps    External Rotation PROM;10 reps    Internal Rotation PROM;10 reps    Flexion PROM;10 reps    ABduction PROM;10 reps      Shoulder Exercises: Seated   Extension AROM;10 reps    Row AROM;10 reps      Shoulder Exercises: Therapy Ball   Flexion 10  reps    Scaption 10 reps      Shoulder Exercises: ROM/Strengthening   Anterior Glide 3x10"      Shoulder Exercises: Isometric Strengthening   Flexion Supine;3X5"    Extension Supine;3X5"    External Rotation Supine;3X5"    Internal Rotation Supine;3X5"    ABduction Supine;3X5"    ADduction Supine;3X5"      Manual Therapy   Manual Therapy Myofascial release    Manual therapy comments completed separately from therapeutic exercises    Myofascial Release myofascial release and manual techniques to right upper arm, trapezius, and scapular regions to decrease pain and fascial restrictions and increase joint ROM                      OT Short Term Goals - 02/12/21 2059       OT SHORT TERM GOAL #1   Title Patient will be educated and independent with HEP to increase functional use of RUE as dominant with all tasks 75% of the time.     Time 6    Period Weeks    Status On-going    Target Date 03/25/21      OT SHORT TERM GOAL #2   Title Patient will increase her RUE A/ROM to [facility] in order to complete functional reaching  tasks at or above shoulder level.    Time 6    Period Weeks    Status On-going      OT SHORT TERM GOAL #3   Title Patient will increase her RUE shoulder strength to 4/5 in order to return to using her RUE to complete meal prep tasks and lift and move moderate weighted items with less difficulty.    Time 6    Period Weeks    Status On-going      OT SHORT TERM GOAL #4   Title Patient will report a decrease in pain level in her RUE of approximately 3/10 or less while completing work related tasks.    Time 6    Period Weeks    Status On-going      OT SHORT TERM GOAL #5   Title Patient will decrease her RUE fascial restrictions to min amount or less in order to increase the functional mobility needed to complete household tasks and dressing tasks.    Time 6    Period Weeks    Status On-going                      Plan - 02/20/21 1500     Clinical Impression Statement A: Pt reporting pain today, continued with myofascial release to address fascial restrictions at anterior shoulder and trapezius regions. Attempted AA/ROM however unable to complete due to pain. Increased isometrics to 3x5" holds and completed for all shoulder planes. Added anterior shoulder glides at doorway. Verbal cuing for form and technique.    Body Structure / Function / Physical Skills ADL;UE functional use;Fascial restriction;Pain;ROM;Strength    Plan P: Continue with passive stretching working to improve ROM tolerance, attempt low level thumb tacks. progress to AA/ROM when able    OT Home Exercise Plan eval: table slides, A/ROM scapular row; 9/19: isometrics    Consulted and Agree with Plan of Care Patient             Patient will benefit from skilled therapeutic intervention in order to improve the following deficits and impairments:   Body Structure / Function / Physical Skills: ADL, UE functional use,  Fascial restriction, Pain, ROM, Strength       Visit Diagnosis: Other symptoms and signs  involving the musculoskeletal system  Acute pain of right shoulder  Stiffness of right shoulder, not elsewhere classified    Problem List Patient Active Problem List   Diagnosis Date Noted   GERD (gastroesophageal reflux disease) 01/19/2021   Encounter for screening colonoscopy 01/19/2021   Fatty liver 01/19/2021   Encounter for screening fecal occult blood testing 12/29/2020   Encounter for gynecological examination with Papanicolaou smear of cervix 12/29/2020   Post-menopausal 12/29/2020   History of stroke 12/29/2020   Hypoglycemia    Hypertriglyceridemia 10/11/2016   Diarrhea due to malabsorption    Benign essential HTN    Transaminitis    Acute ischemic stroke (HCC)    Hyperglycemia    Chronic diastolic congestive heart failure (HCC)    Right hemiparesis (HCC)    Dysarthria, post-stroke    Poorly controlled diabetes mellitus ([place])    Dyslipidemia    Mixed hyperlipidemia    Basilar artery stenosis    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[place]) 10/01/2016   DM (diabetes mellitus), type 2 with neurological complications ([place]) 94/49/6759   Hot flashes 07/30/2013   Hypertension 07/30/2013   Perimenopausal symptoms 07/30/2013    Guadelupe Sabin, OTR/L  [phone] 02/20/2021, 3:14 PM  [place], [place], [address] Phone: [phone]   Fax:  [phone]  Name: Shelley Frazier MRN: [id number] Date of Birth: [birth year]

## 2021-02-24 ENCOUNTER — Ambulatory Visit (HOSPITAL_COMMUNITY): Payer: BC Managed Care – PPO

## 2021-02-24 ENCOUNTER — Other Ambulatory Visit: Payer: Self-pay

## 2021-02-24 ENCOUNTER — Encounter (HOSPITAL_COMMUNITY): Payer: Self-pay

## 2021-02-24 DIAGNOSIS — M25611 Stiffness of right shoulder, not elsewhere classified: Secondary | ICD-10-CM

## 2021-02-24 DIAGNOSIS — R29898 Other symptoms and signs involving the musculoskeletal system: Secondary | ICD-10-CM | POA: Diagnosis not present

## 2021-02-24 DIAGNOSIS — M25511 Pain in right shoulder: Secondary | ICD-10-CM

## 2021-02-24 NOTE — Patient Instructions (Signed)
Perform each exercise ____10-15____ reps. 1-2x days.   1) Protraction   Start by holding a wand or cane at chest height.  Next, slowly push the wand outwards in front of your body so that your elbows become fully straightened. Then, return to the original position.     2) Shoulder FLEXION   In the standing position, hold a wand/cane with both arms, palms down on both sides. Raise up the wand/cane allowing your unaffected arm to perform most of the effort. Your affected arm should be partially relaxed.      3) Internal/External ROTATION   In the standing position, hold a wand/cane with both hands keeping your elbows bent. Move your arms and wand/cane to one side.  Your affected arm should be partially relaxed while your unaffected arm performs most of the effort.             5) Horizontal Abduction/Adduction      Straight arms holding cane at shoulder height, bring cane to right, center, left. Repeat starting to left.   Copyright  VHI. All rights reserved.

## 2021-02-24 NOTE — Therapy (Signed)
[place], [place], [address] Phone: [phone]   Fax:  [phone]  Occupational Therapy Treatment  Patient Details  Name: Shelley Frazier MRN: [id number] Date of Birth: 01/09/1966 Referring Provider (OT): Arther Abbott, MD   Encounter Date: 02/24/2021   OT End of Session - 02/24/21 7341     Visit Number 5    Number of Visits 12    Date for OT Re-Evaluation 03/25/21    Authorization Type [place] Time Period no co insurance. No visit limit $25 copy no auth needed    OT Start Time 1115    OT Stop Time 1153    OT Time Calculation (min) 38 min    Activity Tolerance Patient tolerated treatment well    Behavior During Therapy WFL for tasks assessed/performed             Past Medical History:  Diagnosis Date   Back pain    Diabetes mellitus without complication ([place])    Fatty liver    Gallstones    High cholesterol    Hot flashes 07/30/2013   Humerus fracture    Hypertension    Obesity    Perimenopausal symptoms 07/30/2013   Stroke ([place]) 2018    Past Surgical History:  Procedure Laterality Date   TONSILLECTOMY AND ADENOIDECTOMY     TUBAL LIGATION      There were no vitals filed for this visit.   Subjective Assessment - 02/24/21 1122     Subjective  S: We tried to do the PVC pipe on Friday.    Currently in Pain? Yes    Pain Score 4     Pain Location Shoulder    Pain Orientation Right    Pain Descriptors / Indicators Sore    Pain Type Acute pain    Pain Radiating Towards shoulder to elbow    Pain Onset More than a month ago    Pain Frequency Occasional    Aggravating Factors  forget to be careful    Pain Relieving Factors OTC pain medication, ice and heat    Effect of Pain on Daily Activities mod effect    Multiple Pain Sites No                [facility] OT Assessment - 02/24/21 1146       Assessment   Medical Diagnosis right shoulder proximal  humerus fracture      Precautions   Precautions Shoulder    Type of Shoulder Precautions Follow standard proximal humerus fracture protocol. Week 3-5 Begin AA/ROM if pain is diminished. Week 6-8 (10/3-10/17) A/ROM Week 7-8 shoulder stretches. Week 8-10 scapular strengthening with band, light weight strengthening.    Shoulder Interventions Shoulder sling/immobilizer                      OT Treatments/Exercises (OP) - 02/24/21 1134       Exercises   Exercises Shoulder      Shoulder Exercises: Supine   Protraction PROM;5 reps;AAROM;10 reps    Horizontal ABduction PROM;5 reps;AAROM;10 reps    External Rotation PROM;5 reps;AAROM;10 reps    Internal Rotation PROM;5 reps;AAROM;10 reps    Flexion PROM;5 reps;AAROM;10 reps    ABduction PROM;5 reps      Shoulder Exercises: ROM/Strengthening   Other ROM/Strengthening Exercises PVC pipe flexion10X      Manual Therapy   Manual Therapy Myofascial release    Manual  therapy comments completed separately from therapeutic exercises    Myofascial Release myofascial release and manual techniques to right upper arm, trapezius, and scapular regions to decrease pain and fascial restrictions and increase joint ROM                    OT Education - 02/24/21 1144     Education Details supine AA/ROM (except abduction)    Person(s) Educated Patient    Methods Explanation;Demonstration;Handout;Verbal cues    Comprehension Returned demonstration;Verbalized understanding              OT Short Term Goals - 02/12/21 2059       OT SHORT TERM GOAL #1   Title Patient will be educated and independent with HEP to increase functional use of RUE as dominant with all tasks 75% of the time.     Time 6    Period Weeks    Status On-going    Target Date 03/25/21      OT SHORT TERM GOAL #2   Title Patient will increase her RUE A/ROM to [facility] in order to complete functional reaching tasks at or above shoulder level.    Time 6     Period Weeks    Status On-going      OT SHORT TERM GOAL #3   Title Patient will increase her RUE shoulder strength to 4/5 in order to return to using her RUE to complete meal prep tasks and lift and move moderate weighted items with less difficulty.    Time 6    Period Weeks    Status On-going      OT SHORT TERM GOAL #4   Title Patient will report a decrease in pain level in her RUE of approximately 3/10 or less while completing work related tasks.    Time 6    Period Weeks    Status On-going      OT SHORT TERM GOAL #5   Title Patient will decrease her RUE fascial restrictions to min amount or less in order to increase the functional mobility needed to complete household tasks and dressing tasks.    Time 6    Period Weeks    Status On-going                      Plan - 02/24/21 1215     Clinical Impression Statement A: Manual techniques completed to right upper arm and upper trapezius region to address fascial restrictions. Able to tolerate Passive ROM within pain tolerance. Unable to achieve full range. Completed supine AA/ROM for all exercises except abduction. HEP was updated. PVC pipe slide completed successfully. VC for form and technique were provided.    Body Structure / Function / Physical Skills ADL;UE functional use;Fascial restriction;Pain;ROM;Strength    Plan P: Follow up on HEP. Attempt abduction standing if able to tolerate. Attempt pulleys flexion and either scaption or abduction.    OT Home Exercise Plan eval: table slides, A/ROM scapular row; 9/19: isometrics 9/27: AA/ROM    Consulted and Agree with Plan of Care Patient             Patient will benefit from skilled therapeutic intervention in order to improve the following deficits and impairments:   Body Structure / Function / Physical Skills: ADL, UE functional use, Fascial restriction, Pain, ROM, Strength       Visit Diagnosis: Acute pain of right shoulder  Other symptoms and signs  involving the musculoskeletal system  Stiffness of right shoulder, not elsewhere classified    Problem List Patient Active Problem List   Diagnosis Date Noted   GERD (gastroesophageal reflux disease) 01/19/2021   Encounter for screening colonoscopy 01/19/2021   Fatty liver 01/19/2021   Encounter for screening fecal occult blood testing 12/29/2020   Encounter for gynecological examination with Papanicolaou smear of cervix 12/29/2020   Post-menopausal 12/29/2020   History of stroke 12/29/2020   Hypoglycemia    Hypertriglyceridemia 10/11/2016   Diarrhea due to malabsorption    Benign essential HTN    Transaminitis    Acute ischemic stroke [facility])    Hyperglycemia    Chronic diastolic congestive heart failure (HCC)    Right hemiparesis (HCC)    Dysarthria, post-stroke    Poorly controlled diabetes mellitus ([place])    Dyslipidemia    Mixed hyperlipidemia    Basilar artery stenosis    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[place]) 10/01/2016   DM (diabetes mellitus), type 2 with neurological complications ([place]) 63/87/5643   Hot flashes 07/30/2013   Hypertension 07/30/2013   Perimenopausal symptoms 07/30/2013    Ailene Ravel, OTR/L,CBIS  [phone]  02/24/2021, 1:25 PM  [place], [place], [address] Phone: [phone]   Fax:  [phone]  Name: Shelley Frazier MRN: [id number] Date of Birth: 29-Nov-1965

## 2021-02-26 ENCOUNTER — Encounter (HOSPITAL_COMMUNITY): Payer: Self-pay

## 2021-02-26 ENCOUNTER — Ambulatory Visit (HOSPITAL_COMMUNITY): Payer: BC Managed Care – PPO

## 2021-02-26 ENCOUNTER — Other Ambulatory Visit: Payer: Self-pay

## 2021-02-26 DIAGNOSIS — M25611 Stiffness of right shoulder, not elsewhere classified: Secondary | ICD-10-CM

## 2021-02-26 DIAGNOSIS — M25511 Pain in right shoulder: Secondary | ICD-10-CM

## 2021-02-26 DIAGNOSIS — R29898 Other symptoms and signs involving the musculoskeletal system: Secondary | ICD-10-CM

## 2021-02-26 NOTE — Therapy (Signed)
[place], [place], [address] Phone: [phone]   Fax:  [phone]  Occupational Therapy Treatment  Patient Details  Name: Shelley Frazier MRN: [id number] Date of Birth: 1965/08/18 Referring Provider (OT): Arther Abbott, MD   Encounter Date: 02/26/2021   OT End of Session - 02/26/21 1523     Visit Number 6    Number of Visits 12    Date for OT Re-Evaluation 03/25/21    Authorization Type [place] Time Period no co insurance. No visit limit $25 copy no auth needed    OT Start Time 1430    OT Stop Time 1508    OT Time Calculation (min) 38 min    Activity Tolerance Patient tolerated treatment well    Behavior During Therapy [facility] for tasks assessed/performed             Past Medical History:  Diagnosis Date   Back pain    Diabetes mellitus without complication ([place])    Fatty liver    Gallstones    High cholesterol    Hot flashes 07/30/2013   Humerus fracture    Hypertension    Obesity    Perimenopausal symptoms 07/30/2013   Stroke ([place]) 2018    Past Surgical History:  Procedure Laterality Date   COLONOSCOPY N/A 02/18/2021   Procedure: COLONOSCOPY;  Surgeon: Daneil Dolin, MD;  [facility];  Service: Endoscopy;  Laterality: N/A;  9:30am   POLYPECTOMY  02/18/2021   Procedure: POLYPECTOMY;  Surgeon: Daneil Dolin, MD;  Location: [facility];  Service: Endoscopy;;   TONSILLECTOMY AND ADENOIDECTOMY     TUBAL LIGATION      There were no vitals filed for this visit.   Subjective Assessment - 02/26/21 1435     Currently in Pain? Yes    Pain Score 5     Pain Location Shoulder    Pain Orientation Right    Pain Descriptors / Indicators Sore    Pain Type Acute pain    Pain Radiating Towards shoulder to elbow    Pain Onset More than a month ago    Pain Frequency Occasional    Aggravating Factors  forget to be careful    Pain Relieving Factors  OTC pain medication, ice and heat    Effect of Pain on Daily Activities mod effect                [facility] OT Assessment - 02/26/21 1436       Assessment   Medical Diagnosis right shoulder proximal humerus fracture      Precautions   Precautions Shoulder    Type of Shoulder Precautions Follow standard proximal humerus fracture protocol. Week 3-5 Begin AA/ROM if pain is diminished. Week 6-8 (10/3-10/17) A/ROM Week 7-8 shoulder stretches. Week 8-10 scapular strengthening with band, light weight strengthening.    Shoulder Interventions Shoulder sling/immobilizer                      OT Treatments/Exercises (OP) - 02/26/21 1436       Exercises   Exercises Shoulder      Shoulder Exercises: Supine   Protraction PROM;5 reps;AAROM;10 reps    Horizontal ABduction PROM;5 reps;AAROM;10 reps    External Rotation PROM;5 reps;AAROM;10 reps    Internal Rotation PROM;5 reps;AAROM;10 reps    Flexion PROM;5 reps;AAROM   3X   ABduction PROM;5 reps  Shoulder Exercises: Standing   ABduction AAROM;10 reps      Shoulder Exercises: Pulleys   Flexion 1 minute   standing   Scaption 1 minute   standing   ABduction --      Shoulder Exercises: Therapy Ball   Flexion 15 reps   2" hold at end stretch     Manual Therapy   Manual Therapy Myofascial release    Manual therapy comments completed separately from therapeutic exercises    Myofascial Release myofascial release and manual techniques to right upper arm, trapezius, and scapular regions to decrease pain and fascial restrictions and increase joint ROM                    OT Education - 02/26/21 1523     Education Details verbal education provided to complete standing AA/ROM abduction    Person(s) Educated Patient    Methods Explanation;Demonstration    Comprehension Verbalized understanding;Returned demonstration              OT Short Term Goals - 02/12/21 2059       OT SHORT TERM GOAL #1   Title  Patient will be educated and independent with HEP to increase functional use of RUE as dominant with all tasks 75% of the time.     Time 6    Period Weeks    Status On-going    Target Date 03/25/21      OT SHORT TERM GOAL #2   Title Patient will increase her RUE A/ROM to [facility] in order to complete functional reaching tasks at or above shoulder level.    Time 6    Period Weeks    Status On-going      OT SHORT TERM GOAL #3   Title Patient will increase her RUE shoulder strength to 4/5 in order to return to using her RUE to complete meal prep tasks and lift and move moderate weighted items with less difficulty.    Time 6    Period Weeks    Status On-going      OT SHORT TERM GOAL #4   Title Patient will report a decrease in pain level in her RUE of approximately 3/10 or less while completing work related tasks.    Time 6    Period Weeks    Status On-going      OT SHORT TERM GOAL #5   Title Patient will decrease her RUE fascial restrictions to min amount or less in order to increase the functional mobility needed to complete household tasks and dressing tasks.    Time 6    Period Weeks    Status On-going                      Plan - 02/26/21 1524     Clinical Impression Statement A: Manual techniques were completed to address moderate fascial restrictions located in the right upper arm and upper trapezius region. Unable to tolerate supine AA/ROM flexion this session due to pain when bringing PVC pipe down to legs. Discounted AA/ROM flexion during this session. Added pulleys with patient able to achieve greater ROM for flexion. VC for form and technique were provided.    Body Structure / Function / Physical Skills ADL;UE functional use;Fascial restriction;Pain;ROM;Strength    Plan P: Progress towards standing AA/ROM when able to tolerate based on pain.    Consulted and Agree with Plan of Care Patient  Patient will benefit from skilled therapeutic  intervention in order to improve the following deficits and impairments:   Body Structure / Function / Physical Skills: ADL, UE functional use, Fascial restriction, Pain, ROM, Strength       Visit Diagnosis: Other symptoms and signs involving the musculoskeletal system  Acute pain of right shoulder  Stiffness of right shoulder, not elsewhere classified    Problem List Patient Active Problem List   Diagnosis Date Noted   GERD (gastroesophageal reflux disease) 01/19/2021   Encounter for screening colonoscopy 01/19/2021   Fatty liver 01/19/2021   Encounter for screening fecal occult blood testing 12/29/2020   Encounter for gynecological examination with Papanicolaou smear of cervix 12/29/2020   Post-menopausal 12/29/2020   History of stroke 12/29/2020   Hypoglycemia    Hypertriglyceridemia 10/11/2016   Diarrhea due to malabsorption    Benign essential HTN    Transaminitis    Acute ischemic stroke (HCC)    Hyperglycemia    Chronic diastolic congestive heart failure (HCC)    Right hemiparesis ([place])    Dysarthria, post-stroke    Poorly controlled diabetes mellitus ([place])    Dyslipidemia    Mixed hyperlipidemia    Basilar artery stenosis    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[place]) 10/01/2016   DM (diabetes mellitus), type 2 with neurological complications ([place]) 73/22/0254   Hot flashes 07/30/2013   Hypertension 07/30/2013   Perimenopausal symptoms 07/30/2013    Ailene Ravel, OTR/L,CBIS  [phone]  02/26/2021, 3:27 PM  [place], [place], [address] Phone: [phone]   Fax:  [phone]  Name: JAIMA JANNEY MRN: [id number] Date of Birth: Jun 12, 1965

## 2021-03-03 ENCOUNTER — Other Ambulatory Visit: Payer: Self-pay

## 2021-03-03 ENCOUNTER — Ambulatory Visit (HOSPITAL_COMMUNITY): Payer: BC Managed Care – PPO | Attending: Orthopedic Surgery

## 2021-03-03 ENCOUNTER — Encounter (HOSPITAL_COMMUNITY): Payer: Self-pay

## 2021-03-03 DIAGNOSIS — R29898 Other symptoms and signs involving the musculoskeletal system: Secondary | ICD-10-CM | POA: Insufficient documentation

## 2021-03-03 DIAGNOSIS — M25611 Stiffness of right shoulder, not elsewhere classified: Secondary | ICD-10-CM | POA: Diagnosis present

## 2021-03-03 DIAGNOSIS — M25511 Pain in right shoulder: Secondary | ICD-10-CM | POA: Diagnosis present

## 2021-03-03 NOTE — Therapy (Signed)
[place], [place], [address] Phone: [phone]   Fax:  [phone]  Occupational Therapy Treatment  Patient Details  Name: Shelley Frazier MRN: [id number] Date of Birth: 1966/03/25 Referring Provider (OT): Arther Abbott, MD   Encounter Date: 03/03/2021   OT End of Session - 03/03/21 1153     Visit Number 7    Number of Visits 12    Date for OT Re-Evaluation 03/25/21    Authorization Type [place] Time Period no co insurance. No visit limit $25 copy no auth needed    OT Start Time 1118    OT Stop Time 1154    OT Time Calculation (min) 36 min    Activity Tolerance Patient tolerated treatment well    Behavior During Therapy WFL for tasks assessed/performed             Past Medical History:  Diagnosis Date   Back pain    Diabetes mellitus without complication ([place])    Fatty liver    Gallstones    High cholesterol    Hot flashes 07/30/2013   Humerus fracture    Hypertension    Obesity    Perimenopausal symptoms 07/30/2013   Stroke ([place]) 2018    Past Surgical History:  Procedure Laterality Date   COLONOSCOPY N/A 02/18/2021   Procedure: COLONOSCOPY;  Surgeon: Daneil Dolin, MD;  [facility];  Service: Endoscopy;  Laterality: N/A;  9:30am   POLYPECTOMY  02/18/2021   Procedure: POLYPECTOMY;  Surgeon: Daneil Dolin, MD;  [facility];  Service: Endoscopy;;   TONSILLECTOMY AND ADENOIDECTOMY     TUBAL LIGATION      There were no vitals filed for this visit.   Subjective Assessment - 03/03/21 1124     Subjective  S: I picked up the Grandbaby.    Currently in Pain? Yes    Pain Score 4     Pain Orientation Right    Pain Descriptors / Indicators Sore    Pain Type Acute pain    Pain Onset More than a month ago    Pain Frequency Occasional    Aggravating Factors  Maybe slept on it.    Pain Relieving Factors OTC pain medication, ice and  heaet    Effect of Pain on Daily Activities mod effect    Multiple Pain Sites No                [facility] OT Assessment - 03/03/21 1153       Assessment   Medical Diagnosis right shoulder proximal humerus fracture      Precautions   Precautions Shoulder    Type of Shoulder Precautions Follow standard proximal humerus fracture protocol. Week 3-5 Begin AA/ROM if pain is diminished. Week 6-8 (10/3-10/17) A/ROM Week 7-8 shoulder stretches. Week 8-10 scapular strengthening with band, light weight strengthening.                      OT Treatments/Exercises (OP) - 03/03/21 1135       Exercises   Exercises Shoulder      Shoulder Exercises: Supine   Protraction PROM;5 reps;AAROM;10 reps    Horizontal ABduction PROM;5 reps;AAROM;10 reps    External Rotation PROM;5 reps;AAROM;10 reps    Internal Rotation PROM;5 reps;AAROM;10 reps    Flexion PROM;5 reps;AAROM    ABduction PROM;5 reps      Shoulder Exercises: Standing  Protraction AAROM;10 reps    Horizontal ABduction AAROM;10 reps    External Rotation AAROM;10 reps    Internal Rotation AAROM;10 reps    Flexion AAROM;10 reps    ABduction AAROM;10 reps      Shoulder Exercises: ROM/Strengthening   Wall Wash 1'      Manual Therapy   Manual Therapy Myofascial release    Manual therapy comments completed separately from therapeutic exercises    Myofascial Release myofascial release and manual techniques to right upper arm, trapezius, and scapular regions to decrease pain and fascial restrictions and increase joint ROM                    OT Education - 03/03/21 1153     Education Details Complete AA/ROM standing (all exercises. )    Person(s) Educated Patient    Methods Explanation;Demonstration    Comprehension Verbalized understanding;Returned demonstration              OT Short Term Goals - 02/12/21 2059       OT SHORT TERM GOAL #1   Title Patient will be educated and independent with HEP to  increase functional use of RUE as dominant with all tasks 75% of the time.     Time 6    Period Weeks    Status On-going    Target Date 03/25/21      OT SHORT TERM GOAL #2   Title Patient will increase her RUE A/ROM to [facility] in order to complete functional reaching tasks at or above shoulder level.    Time 6    Period Weeks    Status On-going      OT SHORT TERM GOAL #3   Title Patient will increase her RUE shoulder strength to 4/5 in order to return to using her RUE to complete meal prep tasks and lift and move moderate weighted items with less difficulty.    Time 6    Period Weeks    Status On-going      OT SHORT TERM GOAL #4   Title Patient will report a decrease in pain level in her RUE of approximately 3/10 or less while completing work related tasks.    Time 6    Period Weeks    Status On-going      OT SHORT TERM GOAL #5   Title Patient will decrease her RUE fascial restrictions to min amount or less in order to increase the functional mobility needed to complete household tasks and dressing tasks.    Time 6    Period Weeks    Status On-going                      Plan - 03/03/21 1156     Clinical Impression Statement A: Manual techniques were completed to address moderate fascial restrictions in the right upper arm. Pt completed AA/ROM supine and standing with VC for form and technique. Rest breaks taken when needed. Exercise range was modified based on pain level.    Body Structure / Function / Physical Skills ADL;UE functional use;Fascial restriction;Pain;ROM;Strength    Plan P: Continue with AA/ROM.  Work on increasing ROM within patient's pain tolerance. Continue with pulleys.    Consulted and Agree with Plan of Care Patient             Patient will benefit from skilled therapeutic intervention in order to improve the following deficits and impairments:   Body Structure / Function / Physical  Skills: ADL, UE functional use, Fascial restriction, Pain,  ROM, Strength       Visit Diagnosis: Stiffness of right shoulder, not elsewhere classified  Acute pain of right shoulder  Other symptoms and signs involving the musculoskeletal system    Problem List Patient Active Problem List   Diagnosis Date Noted   GERD (gastroesophageal reflux disease) 01/19/2021   Encounter for screening colonoscopy 01/19/2021   Fatty liver 01/19/2021   Encounter for screening fecal occult blood testing 12/29/2020   Encounter for gynecological examination with Papanicolaou smear of cervix 12/29/2020   Post-menopausal 12/29/2020   History of stroke 12/29/2020   Hypoglycemia    Hypertriglyceridemia 10/11/2016   Diarrhea due to malabsorption    Benign essential HTN    Transaminitis    Acute ischemic stroke (HCC)    Hyperglycemia    Chronic diastolic congestive heart failure (HCC)    Right hemiparesis (HCC)    Dysarthria, post-stroke    Poorly controlled diabetes mellitus ([place])    Dyslipidemia    Mixed hyperlipidemia    Basilar artery stenosis    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[place]) 10/01/2016   DM (diabetes mellitus), type 2 with neurological complications ([place]) 29/24/4628   Hot flashes 07/30/2013   Hypertension 07/30/2013   Perimenopausal symptoms 07/30/2013    Ailene Ravel, OTR/L,CBIS  [phone]  03/03/2021, 12:12 PM  [place] [address] [place], [place], [address] Phone: [phone]   Fax:  [phone]  Name: Shelley Frazier MRN: [id number] Date of Birth: 04/02/1966

## 2021-03-05 ENCOUNTER — Other Ambulatory Visit: Payer: Self-pay

## 2021-03-05 ENCOUNTER — Encounter (HOSPITAL_COMMUNITY): Payer: Self-pay

## 2021-03-05 ENCOUNTER — Ambulatory Visit (HOSPITAL_COMMUNITY): Payer: BC Managed Care – PPO

## 2021-03-05 DIAGNOSIS — M25611 Stiffness of right shoulder, not elsewhere classified: Secondary | ICD-10-CM

## 2021-03-05 DIAGNOSIS — M25511 Pain in right shoulder: Secondary | ICD-10-CM

## 2021-03-05 DIAGNOSIS — R29898 Other symptoms and signs involving the musculoskeletal system: Secondary | ICD-10-CM

## 2021-03-05 NOTE — Therapy (Signed)
[place], [place], [address] Phone: [phone]   Fax:  [phone]  Occupational Therapy Treatment  Patient Details  Name: Shelley Frazier MRN: [id number] Date of Birth: [birth year] Referring Provider (OT): Arther Abbott, MD   Encounter Date: 03/05/2021   OT End of Session - 03/05/21 1330     Visit Number 8    Number of Visits 12    Date for OT Re-Evaluation 03/25/21    Authorization Type [place] Time Period no co insurance. No visit limit $25 copy no auth needed    OT Start Time 1300    OT Stop Time 1338    OT Time Calculation (min) 38 min    Activity Tolerance Patient tolerated treatment well    Behavior During Therapy [facility] for tasks assessed/performed             Past Medical History:  Diagnosis Date   Back pain    Diabetes mellitus without complication ([place])    Fatty liver    Gallstones    High cholesterol    Hot flashes 07/30/2013   Humerus fracture    Hypertension    Obesity    Perimenopausal symptoms 07/30/2013   Stroke ([place]) 2018    Past Surgical History:  Procedure Laterality Date   COLONOSCOPY N/A 02/18/2021   Procedure: COLONOSCOPY;  Surgeon: Daneil Dolin, MD;  [facility];  Service: Endoscopy;  Laterality: N/A;  9:30am   POLYPECTOMY  02/18/2021   Procedure: POLYPECTOMY;  Surgeon: Daneil Dolin, MD;  [facility];  Service: Endoscopy;;   TONSILLECTOMY AND ADENOIDECTOMY     TUBAL LIGATION      There were no vitals filed for this visit.   Subjective Assessment - 03/05/21 1304     Subjective  S: I slept in the bed last night.    Currently in Pain? Yes    Pain Score 4     Pain Location Shoulder    Pain Orientation Right    Pain Descriptors / Indicators Sore    Pain Type Acute pain    Pain Onset More than a month ago    Pain Frequency Occasional    Aggravating Factors  slept on it    Pain Relieving Factors OTC  pain medicadion, ice and heat    Effect of Pain on Daily Activities mod effect    Multiple Pain Sites No                [facility] OT Assessment - 03/05/21 1332       Assessment   Medical Diagnosis right shoulder proximal humerus fracture      Precautions   Precautions Shoulder    Type of Shoulder Precautions Follow standard proximal humerus fracture protocol. Week 3-5 Begin AA/ROM if pain is diminished. Week 6-8 (10/3-10/17) A/ROM Week 7-8 shoulder stretches. Week 8-10 scapular strengthening with band, light weight strengthening.                      OT Treatments/Exercises (OP) - 03/05/21 1316       Exercises   Exercises Shoulder      Shoulder Exercises: Supine   Protraction PROM;5 reps;AAROM;10 reps    Horizontal ABduction PROM;5 reps;AAROM;10 reps    External Rotation PROM;5 reps;AAROM;10 reps    Internal Rotation PROM;5 reps;AAROM;10 reps    Flexion PROM;5 reps;AAROM;10 reps    ABduction PROM;5 reps  Shoulder Exercises: Standing   Protraction AAROM;10 reps    Horizontal ABduction AAROM;10 reps    External Rotation AAROM;10 reps    Internal Rotation AAROM;10 reps    Flexion AAROM;10 reps    ABduction AAROM;10 reps      Shoulder Exercises: Pulleys   Flexion 1 minute   standing   Scaption 1 minute   standing     Shoulder Exercises: ROM/Strengthening   Wall Wash 1'    Anterior Glide 3x10"      Manual Therapy   Manual Therapy Myofascial release    Manual therapy comments completed separately from therapeutic exercises    Myofascial Release myofascial release and manual techniques to right upper arm, trapezius, and scapular regions to decrease pain and fascial restrictions and increase joint ROM                      OT Short Term Goals - 02/12/21 2059       OT SHORT TERM GOAL #1   Title Patient will be educated and independent with HEP to increase functional use of RUE as dominant with all tasks 75% of the time.     Time 6     Period Weeks    Status On-going    Target Date 03/25/21      OT SHORT TERM GOAL #2   Title Patient will increase her RUE A/ROM to [facility] in order to complete functional reaching tasks at or above shoulder level.    Time 6    Period Weeks    Status On-going      OT SHORT TERM GOAL #3   Title Patient will increase her RUE shoulder strength to 4/5 in order to return to using her RUE to complete meal prep tasks and lift and move moderate weighted items with less difficulty.    Time 6    Period Weeks    Status On-going      OT SHORT TERM GOAL #4   Title Patient will report a decrease in pain level in her RUE of approximately 3/10 or less while completing work related tasks.    Time 6    Period Weeks    Status On-going      OT SHORT TERM GOAL #5   Title Patient will decrease her RUE fascial restrictions to min amount or less in order to increase the functional mobility needed to complete household tasks and dressing tasks.    Time 6    Period Weeks    Status On-going                      Plan - 03/05/21 1413     Clinical Impression Statement A: Two rolled towels placed under right elbow when supine as patient was experiencing a catching sensation and pain when shoulder was placed into extension. Myofascial release was complto address moderate fascial restrictions located along the anterior shoulder joint region. Completed supine and standing AA/ROM although reports that standing was more comfortable than supine this date. Resumed pulleys. VC for form and technique.    Body Structure / Function / Physical Skills ADL;UE functional use;Fascial restriction;Pain;ROM;Strength    Plan P: Attempt supine A/ROM if able to tolerate. Low level reaching (idea: Standing clothespins at table)    Consulted and Agree with Plan of Care Patient             Patient will benefit from skilled therapeutic intervention in order to improve the following  deficits and impairments:   Body  Structure / Function / Physical Skills: ADL, UE functional use, Fascial restriction, Pain, ROM, Strength       Visit Diagnosis: Other symptoms and signs involving the musculoskeletal system  Stiffness of right shoulder, not elsewhere classified  Acute pain of right shoulder    Problem List Patient Active Problem List   Diagnosis Date Noted   GERD (gastroesophageal reflux disease) 01/19/2021   Encounter for screening colonoscopy 01/19/2021   Fatty liver 01/19/2021   Encounter for screening fecal occult blood testing 12/29/2020   Encounter for gynecological examination with Papanicolaou smear of cervix 12/29/2020   Post-menopausal 12/29/2020   History of stroke 12/29/2020   Hypoglycemia    Hypertriglyceridemia 10/11/2016   Diarrhea due to malabsorption    Benign essential HTN    Transaminitis    Acute ischemic stroke (HCC)    Hyperglycemia    Chronic diastolic congestive heart failure (HCC)    Right hemiparesis (HCC)    Dysarthria, post-stroke    Poorly controlled diabetes mellitus ([place])    Dyslipidemia    Mixed hyperlipidemia    Basilar artery stenosis    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[place]) 10/01/2016   DM (diabetes mellitus), type 2 with neurological complications ([place]) 03/88/8280   Hot flashes 07/30/2013   Hypertension 07/30/2013   Perimenopausal symptoms 07/30/2013   Ailene Ravel, OTR/L,CBIS  [phone]  03/05/2021, 2:14 PM  [place], [place], [address] Phone: [phone]   Fax:  [phone]  Name: Shelley Frazier MRN: [id number] Date of Birth: [birth year]

## 2021-03-10 ENCOUNTER — Other Ambulatory Visit: Payer: Self-pay

## 2021-03-10 ENCOUNTER — Encounter (HOSPITAL_COMMUNITY): Payer: Self-pay

## 2021-03-10 ENCOUNTER — Ambulatory Visit (HOSPITAL_COMMUNITY): Payer: BC Managed Care – PPO

## 2021-03-10 DIAGNOSIS — M25611 Stiffness of right shoulder, not elsewhere classified: Secondary | ICD-10-CM | POA: Diagnosis not present

## 2021-03-10 DIAGNOSIS — M25511 Pain in right shoulder: Secondary | ICD-10-CM

## 2021-03-10 DIAGNOSIS — R29898 Other symptoms and signs involving the musculoskeletal system: Secondary | ICD-10-CM

## 2021-03-10 NOTE — Therapy (Signed)
[place], [place], [address] Phone: [phone]   Fax:  [phone]  Occupational Therapy Treatment  Patient Details  Name: Shelley Frazier MRN: [id number] Date of Birth: 21-Jan-1966 Referring Provider (OT): Arther Abbott, MD   Encounter Date: 03/10/2021   OT End of Session - 03/10/21 1427     Visit Number 9    Number of Visits 12    Date for OT Re-Evaluation 03/25/21    Authorization Type [place] Time Period no co insurance. No visit limit $25 copy no auth needed    OT Start Time 1345    OT Stop Time 1423    OT Time Calculation (min) 38 min    Activity Tolerance Patient tolerated treatment well    Behavior During Therapy [facility] for tasks assessed/performed             Past Medical History:  Diagnosis Date   Back pain    Diabetes mellitus without complication ([place])    Fatty liver    Gallstones    High cholesterol    Hot flashes 07/30/2013   Humerus fracture    Hypertension    Obesity    Perimenopausal symptoms 07/30/2013   Stroke ([place]) 2018    Past Surgical History:  Procedure Laterality Date   COLONOSCOPY N/A 02/18/2021   Procedure: COLONOSCOPY;  Surgeon: Daneil Dolin, MD;  [facility];  Service: Endoscopy;  Laterality: N/A;  9:30am   POLYPECTOMY  02/18/2021   Procedure: POLYPECTOMY;  Surgeon: Daneil Dolin, MD;  [facility];  Service: Endoscopy;;   TONSILLECTOMY AND ADENOIDECTOMY     TUBAL LIGATION      There were no vitals filed for this visit.   Subjective Assessment - 03/10/21 1350     Subjective  S: I still can't sleep through the night. I usually wake up at 4 or 5 AM.    Currently in Pain? Yes    Pain Score 3     Pain Location Shoulder    Pain Orientation Right    Pain Descriptors / Indicators Sore    Pain Type Acute pain    Pain Onset More than a month ago    Pain Frequency Occasional    Aggravating Factors   sleeping    Pain Relieving Factors OTC pain medication, ice and heat    Effect of Pain on Daily Activities mod effect                [facility] OT Assessment - 03/10/21 1401       Assessment   Medical Diagnosis right shoulder proximal humerus fracture      Precautions   Precautions Shoulder    Type of Shoulder Precautions Follow standard proximal humerus fracture protocol. Week 3-5 Begin AA/ROM if pain is diminished. Week 6-8 (10/3-10/17) A/ROM Week 7-8 shoulder stretches. Week 8-10 scapular strengthening with band, light weight strengthening.                      OT Treatments/Exercises (OP) - 03/10/21 1403       Exercises   Exercises Shoulder      Shoulder Exercises: Supine   Protraction PROM;5 reps;AROM;10 reps    Horizontal ABduction PROM;5 reps;AAROM;10 reps    External Rotation PROM;5 reps;AROM;10 reps    Internal Rotation PROM;5 reps;AROM;10 reps    Flexion PROM;5 reps;AAROM;10 reps    ABduction PROM;5 reps  Shoulder Exercises: ROM/Strengthening   UBE (Upper Arm Bike) Level 1 2' forward 2' reverse   pace: 3.5     Functional Reaching Activities   Mid Level mid level reaching task completed with resistive clothespin while standing. Patient was able to place clothespins higher than shoulder level then removed all as well.      Manual Therapy   Manual Therapy Myofascial release    Manual therapy comments completed separately from therapeutic exercises    Myofascial Release myofascial release and manual techniques to right upper arm, trapezius, and scapular regions to decrease pain and fascial restrictions and increase joint ROM                    OT Education - 03/10/21 1426     Education Details Pt to complete supine shoulder protraction when laying in bed (A/ROM protraction). Continue with AA/ROM for the rest.    Person(s) Educated Patient    Methods Demonstration;Explanation    Comprehension Verbalized understanding;Returned  demonstration              OT Short Term Goals - 02/12/21 2059       OT SHORT TERM GOAL #1   Title Patient will be educated and independent with HEP to increase functional use of RUE as dominant with all tasks 75% of the time.     Time 6    Period Weeks    Status On-going    Target Date 03/25/21      OT SHORT TERM GOAL #2   Title Patient will increase her RUE A/ROM to [facility] in order to complete functional reaching tasks at or above shoulder level.    Time 6    Period Weeks    Status On-going      OT SHORT TERM GOAL #3   Title Patient will increase her RUE shoulder strength to 4/5 in order to return to using her RUE to complete meal prep tasks and lift and move moderate weighted items with less difficulty.    Time 6    Period Weeks    Status On-going      OT SHORT TERM GOAL #4   Title Patient will report a decrease in pain level in her RUE of approximately 3/10 or less while completing work related tasks.    Time 6    Period Weeks    Status On-going      OT SHORT TERM GOAL #5   Title Patient will decrease her RUE fascial restrictions to min amount or less in order to increase the functional mobility needed to complete household tasks and dressing tasks.    Time 6    Period Weeks    Status On-going                      Plan - 03/10/21 1427     Clinical Impression Statement A: Pt only required one towel roll under right elbow this session. Myofascial release completed to address moderate fascial restrictions located in the anterior shoulder region. Experienced catching sensation during passive flexion (returning to towel roll). When making a fist with right hand during entire movement, patient reports a decrease in pain. Able to complete supine A/ROM shoulder protraction. Initially demonstrating increased difficulty for first 2 repetitions due to weakness. VC for form and technique.    Body Structure / Function / Physical Skills ADL;UE functional use;Fascial  restriction;Pain;ROM;Strength    Plan P: Continue to work on increase tolerance and strenth  with supine A/ROM. Barrington Ellison for reaching task.    Consulted and Agree with Plan of Care Patient             Patient will benefit from skilled therapeutic intervention in order to improve the following deficits and impairments:   Body Structure / Function / Physical Skills: ADL, UE functional use, Fascial restriction, Pain, ROM, Strength       Visit Diagnosis: Stiffness of right shoulder, not elsewhere classified  Other symptoms and signs involving the musculoskeletal system  Acute pain of right shoulder    Problem List Patient Active Problem List   Diagnosis Date Noted   GERD (gastroesophageal reflux disease) 01/19/2021   Encounter for screening colonoscopy 01/19/2021   Fatty liver 01/19/2021   Encounter for screening fecal occult blood testing 12/29/2020   Encounter for gynecological examination with Papanicolaou smear of cervix 12/29/2020   Post-menopausal 12/29/2020   History of stroke 12/29/2020   Hypoglycemia    Hypertriglyceridemia 10/11/2016   Diarrhea due to malabsorption    Benign essential HTN    Transaminitis    Acute ischemic stroke (HCC)    Hyperglycemia    Chronic diastolic congestive heart failure (HCC)    Right hemiparesis ([place])    Dysarthria, post-stroke    Poorly controlled diabetes mellitus ([place])    Dyslipidemia    Mixed hyperlipidemia    Basilar artery stenosis    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[place]) 10/01/2016   DM (diabetes mellitus), type 2 with neurological complications ([place]) 91/66/0600   Hot flashes 07/30/2013   Hypertension 07/30/2013   Perimenopausal symptoms 07/30/2013   Ailene Ravel, OTR/L,CBIS  [phone]  03/10/2021, 4:43 PM  [place], [place], [address] Phone: [phone]   Fax:  [phone]  Name: ALMAROSA BOHAC MRN: [id number] Date of Birth: 12-29-1965

## 2021-03-12 ENCOUNTER — Ambulatory Visit (HOSPITAL_COMMUNITY): Payer: BC Managed Care – PPO

## 2021-03-12 ENCOUNTER — Other Ambulatory Visit: Payer: Self-pay

## 2021-03-12 ENCOUNTER — Encounter (HOSPITAL_COMMUNITY): Payer: Self-pay

## 2021-03-12 DIAGNOSIS — M25611 Stiffness of right shoulder, not elsewhere classified: Secondary | ICD-10-CM | POA: Diagnosis not present

## 2021-03-12 DIAGNOSIS — R29898 Other symptoms and signs involving the musculoskeletal system: Secondary | ICD-10-CM

## 2021-03-12 DIAGNOSIS — M25511 Pain in right shoulder: Secondary | ICD-10-CM

## 2021-03-16 NOTE — Therapy (Signed)
[place], [place], [address] Phone: [phone]   Fax:  [phone]  Occupational Therapy Treatment  Patient Details  Name: Shelley Frazier MRN: [id number] Date of Birth: 1965-10-21 Referring Provider (OT): Arther Abbott, MD   Encounter Date: 03/12/2021   OT End of Session - 03/16/21 1034     Visit Number 10    Number of Visits 12    Date for OT Re-Evaluation 03/25/21    Authorization Type [place] Time Period no co insurance. No visit limit $25 copy no auth needed    OT Start Time 1430    OT Stop Time 1508    OT Time Calculation (min) 38 min    Activity Tolerance Patient tolerated treatment well    Behavior During Therapy [facility] for tasks assessed/performed             Past Medical History:  Diagnosis Date   Back pain    Diabetes mellitus without complication ([place])    Fatty liver    Gallstones    High cholesterol    Hot flashes 07/30/2013   Humerus fracture    Hypertension    Obesity    Perimenopausal symptoms 07/30/2013   Stroke ([place]) 2018    Past Surgical History:  Procedure Laterality Date   COLONOSCOPY N/A 02/18/2021   Procedure: COLONOSCOPY;  Surgeon: Daneil Dolin, MD;  [facility];  Service: Endoscopy;  Laterality: N/A;  9:30am   POLYPECTOMY  02/18/2021   Procedure: POLYPECTOMY;  Surgeon: Daneil Dolin, MD;  [facility];  Service: Endoscopy;;   TONSILLECTOMY AND ADENOIDECTOMY     TUBAL LIGATION      There were no vitals filed for this visit.     03/12/21 1439  Symptoms/Limitations  Subjective  S: Nothing new to report.  Pain Assessment  Currently in Pain? Yes  Pain Score 3  Pain Location Shoulder  Pain Orientation Right  Pain Descriptors / Indicators Sore  Pain Type Acute pain  Pain Onset More than a month ago  Pain Frequency Occasional  Aggravating Factors  when laying supine with elbow down on bed or mat  table.  Pain Relieving Factors OTC pain medication, ice and heat  Effect of Pain on Daily Activities moderate effect  Multiple Pain Sites No      [facility] OT Assessment - 03/16/21 0001       Assessment   Medical Diagnosis right shoulder proximal humerus fracture      Precautions   Precautions Shoulder    Type of Shoulder Precautions Follow standard proximal humerus fracture protocol. Week 3-5 Begin AA/ROM if pain is diminished. Week 6-8 (10/3-10/17) A/ROM Week 7-8 shoulder stretches. Week 8-10 scapular strengthening with band, light weight strengthening.                      OT Treatments/Exercises (OP) - 03/16/21 0001       Exercises   Exercises Shoulder      Shoulder Exercises: Supine   Protraction PROM;5 reps;AROM;10 reps    Horizontal ABduction PROM;5 reps;AAROM;10 reps    External Rotation PROM;5 reps;AROM;15 reps    Internal Rotation PROM;5 reps;AROM;15 reps    Flexion PROM;5 reps;AAROM;10 reps    ABduction PROM;5 reps      Shoulder Exercises: Standing   ABduction AAROM;10 reps      Shoulder Exercises: ROM/Strengthening   UBE (Upper Arm Bike) Level 1 2'  forward 2' reverse   pace.3.0   Wall Wash 1'      Functional Reaching Activities   High Level high level reaching completed using 10 cones. Able to place each cone on the top overhead shelf of black shelving unit in large treatment room. Unable to bring cones back down.      Manual Therapy   Manual Therapy Myofascial release    Manual therapy comments completed separately from therapeutic exercises    Myofascial Release myofascial release and manual techniques to right upper arm, trapezius, and scapular regions to decrease pain and fascial restrictions and increase joint ROM                      OT Short Term Goals - 02/12/21 2059       OT SHORT TERM GOAL #1   Title Patient will be educated and independent with HEP to increase functional use of RUE as dominant with all tasks 75% of the  time.     Time 6    Period Weeks    Status On-going    Target Date 03/25/21      OT SHORT TERM GOAL #2   Title Patient will increase her RUE A/ROM to [facility] in order to complete functional reaching tasks at or above shoulder level.    Time 6    Period Weeks    Status On-going      OT SHORT TERM GOAL #3   Title Patient will increase her RUE shoulder strength to 4/5 in order to return to using her RUE to complete meal prep tasks and lift and move moderate weighted items with less difficulty.    Time 6    Period Weeks    Status On-going      OT SHORT TERM GOAL #4   Title Patient will report a decrease in pain level in her RUE of approximately 3/10 or less while completing work related tasks.    Time 6    Period Weeks    Status On-going      OT SHORT TERM GOAL #5   Title Patient will decrease her RUE fascial restrictions to min amount or less in order to increase the functional mobility needed to complete household tasks and dressing tasks.    Time 6    Period Weeks    Status On-going                      Plan - 03/16/21 1035     Clinical Impression Statement A: Continues to need a towel roll placed under right elbow when supine (2 used this date due to flattness of towel). Myofascial release completed to right upper arm and anterior shoulder region to address fascial restrictions. Experienced 1-2 times of shoulder catching during passive ROM shoulder flexion. Improvement was noted during all passive ROM this date versus previous session. Continued with A/ROM and AA/ROM as needed with VC for form and technique provided.    Body Structure / Function / Physical Skills ADL;UE functional use;Fascial restriction;Pain;ROM;Strength    Plan P: Continue to work on increase tolerance and strenth with supine A/ROM. Barrington Ellison for reaching task.    Consulted and Agree with Plan of Care Patient             Patient will benefit from skilled therapeutic intervention in order to improve  the following deficits and impairments:   Body Structure / Function / Physical Skills: ADL, UE functional use, Fascial  restriction, Pain, ROM, Strength       Visit Diagnosis: Stiffness of right shoulder, not elsewhere classified  Acute pain of right shoulder  Other symptoms and signs involving the musculoskeletal system    Problem List Patient Active Problem List   Diagnosis Date Noted   GERD (gastroesophageal reflux disease) 01/19/2021   Encounter for screening colonoscopy 01/19/2021   Fatty liver 01/19/2021   Encounter for screening fecal occult blood testing 12/29/2020   Encounter for gynecological examination with Papanicolaou smear of cervix 12/29/2020   Post-menopausal 12/29/2020   History of stroke 12/29/2020   Hypoglycemia    Hypertriglyceridemia 10/11/2016   Diarrhea due to malabsorption    Benign essential HTN    Transaminitis    Acute ischemic stroke (HCC)    Hyperglycemia    Chronic diastolic congestive heart failure (HCC)    Right hemiparesis (HCC)    Dysarthria, post-stroke    Poorly controlled diabetes mellitus ([place])    Dyslipidemia    Mixed hyperlipidemia    Basilar artery stenosis    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[place]) 10/01/2016   DM (diabetes mellitus), type 2 with neurological complications ([place]) 27/25/3664   Hot flashes 07/30/2013   Hypertension 07/30/2013   Perimenopausal symptoms 07/30/2013    Ailene Ravel, OTR/L,CBIS  [phone]  03/16/2021, 10:38 AM  [place], [place], [address] Phone: [phone]   Fax:  [phone]  Name: Shelley Frazier MRN: [id number] Date of Birth: 1965/08/26

## 2021-03-17 ENCOUNTER — Encounter (HOSPITAL_COMMUNITY): Payer: Self-pay | Admitting: Occupational Therapy

## 2021-03-17 ENCOUNTER — Ambulatory Visit (HOSPITAL_COMMUNITY): Payer: BC Managed Care – PPO | Admitting: Occupational Therapy

## 2021-03-17 ENCOUNTER — Other Ambulatory Visit: Payer: Self-pay

## 2021-03-17 DIAGNOSIS — M25611 Stiffness of right shoulder, not elsewhere classified: Secondary | ICD-10-CM | POA: Diagnosis not present

## 2021-03-17 DIAGNOSIS — M25511 Pain in right shoulder: Secondary | ICD-10-CM

## 2021-03-17 DIAGNOSIS — R29898 Other symptoms and signs involving the musculoskeletal system: Secondary | ICD-10-CM

## 2021-03-17 NOTE — Therapy (Signed)
[place], [place], [address] Phone: [phone]   Fax:  [phone]  Occupational Therapy Treatment  Patient Details  Name: Shelley Frazier MRN: [id number] Date of Birth: 06-17-1965 Referring Provider (OT): Arther Abbott, MD   Encounter Date: 03/17/2021   OT End of Session - 03/17/21 1400     Visit Number 11    Number of Visits 12    Date for OT Re-Evaluation 03/25/21    Authorization Type [place] Time Period no co insurance. No visit limit $25 copy no auth needed    OT Start Time 1302    OT Stop Time 1341    OT Time Calculation (min) 39 min    Activity Tolerance Patient tolerated treatment well    Behavior During Therapy [facility] for tasks assessed/performed             Past Medical History:  Diagnosis Date   Back pain    Diabetes mellitus without complication ([place])    Fatty liver    Gallstones    High cholesterol    Hot flashes 07/30/2013   Humerus fracture    Hypertension    Obesity    Perimenopausal symptoms 07/30/2013   Stroke ([place]) 2018    Past Surgical History:  Procedure Laterality Date   COLONOSCOPY N/A 02/18/2021   Procedure: COLONOSCOPY;  Surgeon: Daneil Dolin, MD;  [facility];  Service: Endoscopy;  Laterality: N/A;  9:30am   POLYPECTOMY  02/18/2021   Procedure: POLYPECTOMY;  Surgeon: Daneil Dolin, MD;  [facility];  Service: Endoscopy;;   TONSILLECTOMY AND ADENOIDECTOMY     TUBAL LIGATION      There were no vitals filed for this visit.   Subjective Assessment - 03/17/21 1256     Subjective  S: It feels worse in the mornings.    Currently in Pain? No/denies                [facility] OT Assessment - 03/17/21 1256       Assessment   Medical Diagnosis right shoulder proximal humerus fracture      Precautions   Precautions Shoulder    Type of Shoulder Precautions Follow standard proximal humerus fracture  protocol. Week 3-5 Begin AA/ROM if pain is diminished. Week 6-8 (10/3-10/17) A/ROM Week 7-8 shoulder stretches. Week 8-10 scapular strengthening with band, light weight strengthening.                      OT Treatments/Exercises (OP) - 03/17/21 1302       Exercises   Exercises Shoulder      Shoulder Exercises: Supine   Protraction PROM;5 reps;AROM;10 reps    Horizontal ABduction PROM;5 reps;AAROM;10 reps    External Rotation PROM;5 reps;AROM;15 reps    Internal Rotation PROM;5 reps;AROM;15 reps    Flexion PROM;5 reps;AAROM;10 reps    ABduction PROM;5 reps      Shoulder Exercises: Seated   Protraction AROM;10 reps    Flexion AROM;10 reps      Shoulder Exercises: Standing   Protraction AROM;10 reps    Horizontal ABduction AROM;10 reps    External Rotation AROM;10 reps    Internal Rotation AROM;10 reps    Flexion AROM;10 reps    ABduction AROM;10 reps;Limitations    ABduction Limitations 50% range      Shoulder Exercises: ROM/Strengthening   UBE (Upper Arm Bike) Level 1 2' forward 2'  reverse, pace: 4.0    Wall Wash 1'      Functional Reaching Activities   High Level Pt placing squigz onto cabinet door at overhead level, then removing. Completed 10 squigz      Manual Therapy   Manual Therapy Myofascial release    Manual therapy comments completed separately from therapeutic exercises    Myofascial Release myofascial release and manual techniques to right upper arm, trapezius, and scapular regions to decrease pain and fascial restrictions and increase joint ROM                      OT Short Term Goals - 02/12/21 2059       OT SHORT TERM GOAL #1   Title Patient will be educated and independent with HEP to increase functional use of RUE as dominant with all tasks 75% of the time.     Time 6    Period Weeks    Status On-going    Target Date 03/25/21      OT SHORT TERM GOAL #2   Title Patient will increase her RUE A/ROM to [facility] in order to  complete functional reaching tasks at or above shoulder level.    Time 6    Period Weeks    Status On-going      OT SHORT TERM GOAL #3   Title Patient will increase her RUE shoulder strength to 4/5 in order to return to using her RUE to complete meal prep tasks and lift and move moderate weighted items with less difficulty.    Time 6    Period Weeks    Status On-going      OT SHORT TERM GOAL #4   Title Patient will report a decrease in pain level in her RUE of approximately 3/10 or less while completing work related tasks.    Time 6    Period Weeks    Status On-going      OT SHORT TERM GOAL #5   Title Patient will decrease her RUE fascial restrictions to min amount or less in order to increase the functional mobility needed to complete household tasks and dressing tasks.    Time 6    Period Weeks    Status On-going                      Plan - 03/17/21 1401     Clinical Impression Statement A: Continued with myofascial releae and manual techniques to address fascial restrictions. Pt with increased pain and catching when attempting A/ROM in supine, returned to AA/ROM in supine. Pt completing A/ROM in standing with less pain and improved tolerance for ROM. Added squigz functional reaching task. Verbal cuing for form and technique.    Body Structure / Function / Physical Skills ADL;UE functional use;Fascial restriction;Pain;ROM;Strength    Plan P: Reassessment, discontinue supine A/ROM due to pain; update HEP    OT Home Exercise Plan eval: table slides, A/ROM scapular row; 9/19: isometrics 9/27: AA/ROM    Consulted and Agree with Plan of Care Patient             Patient will benefit from skilled therapeutic intervention in order to improve the following deficits and impairments:   Body Structure / Function / Physical Skills: ADL, UE functional use, Fascial restriction, Pain, ROM, Strength       Visit Diagnosis: Stiffness of right shoulder, not elsewhere  classified  Acute pain of right shoulder  Other symptoms and signs  involving the musculoskeletal system    Problem List Patient Active Problem List   Diagnosis Date Noted   GERD (gastroesophageal reflux disease) 01/19/2021   Encounter for screening colonoscopy 01/19/2021   Fatty liver 01/19/2021   Encounter for screening fecal occult blood testing 12/29/2020   Encounter for gynecological examination with Papanicolaou smear of cervix 12/29/2020   Post-menopausal 12/29/2020   History of stroke 12/29/2020   Hypoglycemia    Hypertriglyceridemia 10/11/2016   Diarrhea due to malabsorption    Benign essential HTN    Transaminitis    Acute ischemic stroke [facility])    Hyperglycemia    Chronic diastolic congestive heart failure (HCC)    Right hemiparesis (HCC)    Dysarthria, post-stroke    Poorly controlled diabetes mellitus ([place])    Dyslipidemia    Mixed hyperlipidemia    Basilar artery stenosis    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[place]) 10/01/2016   DM (diabetes mellitus), type 2 with neurological complications ([place]) 37/01/6437   Hot flashes 07/30/2013   Hypertension 07/30/2013   Perimenopausal symptoms 07/30/2013    Guadelupe Sabin, OTR/L  [phone] 03/17/2021, 2:07 PM  [place], [place], [address] Phone: [phone]   Fax:  [phone]  Name: Shelley Frazier MRN: [id number] Date of Birth: 1966-04-29

## 2021-03-19 ENCOUNTER — Other Ambulatory Visit: Payer: Self-pay

## 2021-03-19 ENCOUNTER — Ambulatory Visit (HOSPITAL_COMMUNITY): Payer: BC Managed Care – PPO | Admitting: Occupational Therapy

## 2021-03-19 ENCOUNTER — Encounter (HOSPITAL_COMMUNITY): Payer: Self-pay | Admitting: Occupational Therapy

## 2021-03-19 DIAGNOSIS — R29898 Other symptoms and signs involving the musculoskeletal system: Secondary | ICD-10-CM

## 2021-03-19 DIAGNOSIS — M25611 Stiffness of right shoulder, not elsewhere classified: Secondary | ICD-10-CM

## 2021-03-19 DIAGNOSIS — M25511 Pain in right shoulder: Secondary | ICD-10-CM

## 2021-03-19 NOTE — Patient Instructions (Signed)

## 2021-03-19 NOTE — Therapy (Signed)
[place], [place], [address] Phone: [phone]   Fax:  [phone]  Occupational Therapy Reassessment & Treatment (Recertification)  Patient Details  Name: Shelley Frazier MRN: [id number] Date of Birth: 10-02-1965 Referring Provider (OT): Arther Abbott, MD   Encounter Date: 03/19/2021   OT End of Session - 03/19/21 1503     Visit Number 12    Number of Visits 16    Date for OT Re-Evaluation 04/18/21    Authorization Type [place] Time Period no co insurance. No visit limit $25 copy no auth needed    OT Start Time 1347    OT Stop Time 1428    OT Time Calculation (min) 41 min    Activity Tolerance Patient tolerated treatment well    Behavior During Therapy [facility] for tasks assessed/performed             Past Medical History:  Diagnosis Date   Back pain    Diabetes mellitus without complication (Shelley Frazier)    Fatty liver    Gallstones    High cholesterol    Hot flashes 07/30/2013   Humerus fracture    Hypertension    Obesity    Perimenopausal symptoms 07/30/2013   Stroke (Shelley Frazier) 2018    Past Surgical History:  Procedure Laterality Date   COLONOSCOPY N/A 02/18/2021   Procedure: COLONOSCOPY;  Surgeon: Daneil Dolin, MD;  [facility];  Service: Endoscopy;  Laterality: N/A;  9:30am   POLYPECTOMY  02/18/2021   Procedure: POLYPECTOMY;  Surgeon: Daneil Dolin, MD;  [facility];  Service: Endoscopy;;   TONSILLECTOMY AND ADENOIDECTOMY     TUBAL LIGATION      There were no vitals filed for this visit.   Subjective Assessment - 03/19/21 1347     Subjective  S: It's feeling pretty good today.    Currently in Pain? No/denies                [facility] OT Assessment - 03/19/21 1346       Assessment   Medical Diagnosis right shoulder proximal humerus fracture      Precautions   Precautions Shoulder    Type of Shoulder Precautions Follow  standard proximal humerus fracture protocol. Week 3-5 Begin AA/ROM if pain is diminished. Week 6-8 (10/3-10/17) A/ROM Week 7-8 shoulder stretches. Week 8-10 scapular strengthening with band, light weight strengthening.      Observation/Other Assessments   Focus on Therapeutic Outcomes (FOTO)  61/100   61/100 previous     Palpation   Palpation comment Min fascial restrictions palpated in the right upper arm, upper trapezius, scapularis region.      AROM   Overall AROM Comments Assessed seated, er/IR adducted    AROM Assessment Site Shoulder    Right/Left Shoulder Right    Right Shoulder Flexion 111 Degrees   not previously assessed   Right Shoulder ABduction 129 Degrees   not previously assessed   Right Shoulder Internal Rotation 90 Degrees   not previously assessed   Right Shoulder External Rotation 54 Degrees   not previously assessed     PROM   Overall PROM Comments Assessed supine. IR/er adducted    PROM Assessment Site Shoulder    Right/Left Shoulder Right    Right Shoulder Flexion 125 Degrees   100 previous   Right Shoulder ABduction 130 Degrees   110 previous   Right Shoulder Internal Rotation 80  Degrees   same as previous   Right Shoulder External Rotation 31 Degrees   25 previous     Strength   Overall Strength Comments Assessed seated, er/IR adducted    Strength Assessment Site Shoulder    Right/Left Shoulder Right    Right Shoulder Flexion 4-/5   not previously assessed   Right Shoulder ABduction 3+/5   not previously assessed   Right Shoulder Internal Rotation 4-/5   not previously assessed   Right Shoulder External Rotation 3-/5   not previously assessed                     OT Treatments/Exercises (OP) - 03/19/21 1349       Exercises   Exercises Shoulder      Shoulder Exercises: Standing   Protraction AROM;10 reps    Horizontal ABduction AROM;10 reps    External Rotation AROM;10 reps    Internal Rotation AROM;10 reps    Flexion AROM;10 reps     ABduction AROM;10 reps;Limitations    ABduction Limitations 50% range    Extension Theraband;10 reps    Theraband Level (Shoulder Extension) Level 2 (Red)    Row Theraband;10 reps    Theraband Level (Shoulder Row) Level 2 (Red)    Retraction Theraband;10 reps    Theraband Level (Shoulder Retraction) Level 2 (Red)      Shoulder Exercises: ROM/Strengthening   UBE (Upper Arm Bike) Level 1 2' forward 2' reverse, pace: 4.0    Over Head Lace 2' seated    Proximal Shoulder Strengthening, Seated 10X each, no rest breaks      Functional Reaching Activities   High Level Pt placing 10 cones on overhead shelf in flexion, removing in abduction.      Manual Therapy   Manual Therapy Myofascial release    Manual therapy comments completed separately from therapeutic exercises    Myofascial Release myofascial release and manual techniques to right upper arm, trapezius, and scapular regions to decrease pain and fascial restrictions and increase joint ROM                    OT Education - 03/19/21 1411     Education Details shoulder A/ROM    Person(s) Educated Patient    Methods Demonstration;Explanation;Handout    Comprehension Verbalized understanding;Returned demonstration              OT Short Term Goals - 03/19/21 1408       OT SHORT TERM GOAL #1   Title Patient will be educated and independent with HEP to increase functional use of RUE as dominant with all tasks 75% of the time.     Time 6    Period Weeks    Status Achieved    Target Date 03/25/21      OT SHORT TERM GOAL #2   Title Patient will increase her RUE A/ROM to [facility] in order to complete functional reaching tasks at or above shoulder level.    Time 6    Period Weeks    Status On-going      OT SHORT TERM GOAL #3   Title Patient will increase her RUE shoulder strength to 4/5 in order to return to using her RUE to complete meal prep tasks and lift and move moderate weighted items with less difficulty.    Time 6     Period Weeks    Status On-going      OT SHORT TERM GOAL #4   Title  Patient will report a decrease in pain level in her RUE of approximately 3/10 or less while completing work related tasks.    Time 6    Period Weeks    Status Achieved      OT SHORT TERM GOAL #5   Title Patient will decrease her RUE fascial restrictions to min amount or less in order to increase the functional mobility needed to complete household tasks and dressing tasks.    Time 6    Period Weeks    Status Achieved                      Plan - 03/19/21 1408     Clinical Impression Statement A: Reassessment completed this session, pt has met 3 goals and is making good progress with her ROM and strength. She can now put on deodorant and is reaching to higher levels during functional reaching tasks. Pt continues to have decreased strength and functional use of the RUE. Completed passive stretching and standing A/ROM today, functional reaching tasks, and added scapular theraband. Verbal cuing for form and technique.    OT Occupational Profile and History Problem Focused Assessment - Including review of records relating to presenting problem    Occupational performance deficits (Please refer to evaluation for details): ADL's;IADL's;Rest and Sleep;Work;Leisure    Body Structure / Function / Physical Skills ADL;UE functional use;Fascial restriction;Pain;ROM;Strength    Rehab Potential Good    Clinical Decision Making Limited treatment options, no task modification necessary    Comorbidities Affecting Occupational Performance: Presence of comorbidities impacting occupational performance    Comorbidities impacting occupational performance description: hx of CVA effecting the right side.    Modification or Assistance to Complete Evaluation  No modification of tasks or assist necessary to complete eval    OT Frequency 1x / week    OT Duration 4 weeks    OT Treatment/Interventions Self-care/ADL  training;Ultrasound;Patient/family education;Cryotherapy;Electrical Stimulation;Moist Heat;Therapeutic exercise;Manual Therapy;Therapeutic activities;Neuromuscular education;Passive range of motion    Plan P: Pt will benefit from continued skilled OT services to decrease pain and fascial restrictions, increase ROM, strength, and functional use of the RUE. Next session: continue with A/ROM and scapular theraband and add theraband to HEP.    OT Home Exercise Plan eval: table slides, A/ROM scapular row; 9/19: isometrics 9/27: AA/ROM; 10/20: A/ROM    Consulted and Agree with Plan of Care Patient             Patient will benefit from skilled therapeutic intervention in order to improve the following deficits and impairments:   Body Structure / Function / Physical Skills: ADL, UE functional use, Fascial restriction, Pain, ROM, Strength       Visit Diagnosis: Stiffness of right shoulder, not elsewhere classified  Acute pain of right shoulder  Other symptoms and signs involving the musculoskeletal system    Problem List Patient Active Problem List   Diagnosis Date Noted   GERD (gastroesophageal reflux disease) 01/19/2021   Encounter for screening colonoscopy 01/19/2021   Fatty liver 01/19/2021   Encounter for screening fecal occult blood testing 12/29/2020   Encounter for gynecological examination with Papanicolaou smear of cervix 12/29/2020   Post-menopausal 12/29/2020   History of stroke 12/29/2020   Hypoglycemia    Hypertriglyceridemia 10/11/2016   Diarrhea due to malabsorption    Benign essential HTN    Transaminitis    Acute ischemic stroke (HCC)    Hyperglycemia    Chronic diastolic congestive heart failure ([place])    Right  hemiparesis ([place])    Dysarthria, post-stroke    Poorly controlled diabetes mellitus ([place])    Dyslipidemia    Mixed hyperlipidemia    Basilar artery stenosis    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[place]) 10/01/2016   DM (diabetes mellitus), type 2 with neurological  complications ([place]) 74/12/1446   Hot flashes 07/30/2013   Hypertension 07/30/2013   Perimenopausal symptoms 07/30/2013    Guadelupe Sabin, OTR/L  [phone] 03/19/2021, 3:24 PM  [place], [place], [address] Phone: [phone]   Fax:  [phone]  Name: NADIA VIAR MRN: [id number] Date of Birth: June 26, 1965

## 2021-03-24 ENCOUNTER — Ambulatory Visit (HOSPITAL_COMMUNITY): Payer: BC Managed Care – PPO

## 2021-03-25 ENCOUNTER — Other Ambulatory Visit: Payer: Self-pay

## 2021-03-25 ENCOUNTER — Ambulatory Visit (HOSPITAL_COMMUNITY): Payer: BC Managed Care – PPO | Admitting: Occupational Therapy

## 2021-03-25 ENCOUNTER — Encounter (HOSPITAL_COMMUNITY): Payer: Self-pay | Admitting: Occupational Therapy

## 2021-03-25 DIAGNOSIS — R29898 Other symptoms and signs involving the musculoskeletal system: Secondary | ICD-10-CM

## 2021-03-25 DIAGNOSIS — M25611 Stiffness of right shoulder, not elsewhere classified: Secondary | ICD-10-CM

## 2021-03-25 DIAGNOSIS — M25511 Pain in right shoulder: Secondary | ICD-10-CM

## 2021-03-25 NOTE — Therapy (Signed)
[place], [place], [address] Phone: [phone]   Fax:  [phone]  Occupational Therapy Treatment  Patient Details  Name: STARKEISHA VANWINKLE MRN: [id number] Date of Birth: [birth year] Referring Provider (OT): Arther Abbott, MD   Encounter Date: 03/25/2021   OT End of Session - 03/25/21 1243     Visit Number 13    Number of Visits 16    Date for OT Re-Evaluation 04/18/21    Authorization Type [place] Time Period no co insurance. No visit limit $25 copy no auth needed    OT Start Time 1031    OT Stop Time 1113    OT Time Calculation (min) 42 min    Activity Tolerance Patient tolerated treatment well    Behavior During Therapy [facility] for tasks assessed/performed             Past Medical History:  Diagnosis Date   Back pain    Diabetes mellitus without complication ([place])    Fatty liver    Gallstones    High cholesterol    Hot flashes 07/30/2013   Humerus fracture    Hypertension    Obesity    Perimenopausal symptoms 07/30/2013   Stroke ([place]) 2018    Past Surgical History:  Procedure Laterality Date   COLONOSCOPY N/A 02/18/2021   Procedure: COLONOSCOPY;  Surgeon: Daneil Dolin, MD;  [facility];  Service: Endoscopy;  Laterality: N/A;  9:30am   POLYPECTOMY  02/18/2021   Procedure: POLYPECTOMY;  Surgeon: Daneil Dolin, MD;  [facility];  Service: Endoscopy;;   TONSILLECTOMY AND ADENOIDECTOMY     TUBAL LIGATION      There were no vitals filed for this visit.   Subjective Assessment - 03/25/21 1029     Subjective  S: No pain right now.    Currently in Pain? No/denies                [facility] OT Assessment - 03/25/21 0001       Assessment   Medical Diagnosis right shoulder proximal humerus fracture      Precautions   Precautions Shoulder    Type of Shoulder Precautions Follow standard proximal humerus fracture protocol. Week  3-5 Begin AA/ROM if pain is diminished. Week 6-8 (10/3-10/17) A/ROM Week 7-8 shoulder stretches. Week 8-10 scapular strengthening with band, light weight strengthening.                      OT Treatments/Exercises (OP) - 03/25/21 0001       Exercises   Exercises Shoulder      Shoulder Exercises: Supine   Protraction PROM;5 reps;AROM;10 reps   Assist to achieve initial position.   Horizontal ABduction PROM;5 reps;AAROM;10 reps    External Rotation PROM;5 reps;AROM;15 reps    Internal Rotation PROM;5 reps;AROM;15 reps    Flexion PROM;5 reps;AAROM;10 reps   Min A for first ~4 reps.   ABduction PROM;5 reps      Shoulder Exercises: Standing   Protraction AROM;10 reps    Horizontal ABduction AROM;10 reps    External Rotation AROM;10 reps    Internal Rotation AROM;10 reps    Flexion AROM;10 reps    ABduction AROM;10 reps;Limitations    ABduction Limitations 50% to 75% range    Extension Theraband;10 reps    Theraband Level (Shoulder Extension) Level 2 (Red)    Row Theraband;10 reps  Theraband Level (Shoulder Row) Level 2 (Red)    Retraction Theraband;10 reps    Theraband Level (Shoulder Retraction) Level 2 (Red)      Shoulder Exercises: ROM/Strengthening   UBE (Upper Arm Bike) Level 1 3' forward 3' reverse, pace: 3.0    Ball on Wall 1'flexion ; 1' abduction      Manual Therapy   Manual Therapy Myofascial release    Manual therapy comments completed separately from therapeutic exercises    Myofascial Release myofascial release and manual techniques to right upper arm to decrease pain and fascial restrictions and increase joint ROM                    OT Education - 03/25/21 1244     Education Details scauplar theraband strengthening    Person(s) Educated Patient    Methods Demonstration;Explanation;Handout    Comprehension Verbalized understanding;Returned demonstration              OT Short Term Goals - 03/19/21 1408       OT SHORT TERM GOAL  #1   Title Patient will be educated and independent with HEP to increase functional use of RUE as dominant with all tasks 75% of the time.     Time 6    Period Weeks    Status Achieved    Target Date 03/25/21      OT SHORT TERM GOAL #2   Title Patient will increase her RUE A/ROM to [facility] in order to complete functional reaching tasks at or above shoulder level.    Time 6    Period Weeks    Status On-going      OT SHORT TERM GOAL #3   Title Patient will increase her RUE shoulder strength to 4/5 in order to return to using her RUE to complete meal prep tasks and lift and move moderate weighted items with less difficulty.    Time 6    Period Weeks    Status On-going      OT SHORT TERM GOAL #4   Title Patient will report a decrease in pain level in her RUE of approximately 3/10 or less while completing work related tasks.    Time 6    Period Weeks    Status Achieved      OT SHORT TERM GOAL #5   Title Patient will decrease her RUE fascial restrictions to min amount or less in order to increase the functional mobility needed to complete household tasks and dressing tasks.    Time 6    Period Weeks    Status Achieved                      Plan - 03/25/21 1245     Clinical Impression Statement A: Attempted session without myofascial release initially due to pt meeting goal. Pt demonstrated difficulty with supine A/ROM initiation of movments of flexion and protraction. Myofascial release completed due to pt report of upper arm stiffness. Pt able to complete A/ROM in standing after this with noted limitaoins only in abduction, but improved to 50 to 75% of full range. Pt tolerated increased time on UBE and ball on wall exercises. Provided HEP for theraband scapular strengthening as well.    Occupational performance deficits (Please refer to evaluation for details): ADL's;IADL's;Rest and Sleep;Work;Leisure    Body Structure / Function / Physical Skills ADL;UE functional use;Fascial  restriction;Pain;ROM;Strength    OT Treatment/Interventions Self-care/ADL training;Ultrasound;Patient/family education;Cryotherapy;Electrical Stimulation;Moist Heat;Therapeutic exercise;Manual Therapy;Therapeutic activities;Neuromuscular  education;Passive range of motion    Plan P: Likely continue myofascial relase as preperatory activity prior to A/ROM in standing. Continue scapular strengthening and overhead reaching. Emphasis on increasing abduction a/rom. Possibly add light strengthening.    OT Home Exercise Plan eval: table slides, A/ROM scapular row; 9/19: isometrics 9/27: AA/ROM; 10/20: A/ROM; 10/26 scapular theraband strengthening    Consulted and Agree with Plan of Care Patient             Patient will benefit from skilled therapeutic intervention in order to improve the following deficits and impairments:   Body Structure / Function / Physical Skills: ADL, UE functional use, Fascial restriction, Pain, ROM, Strength       Visit Diagnosis: Stiffness of right shoulder, not elsewhere classified  Acute pain of right shoulder  Other symptoms and signs involving the musculoskeletal system    Problem List Patient Active Problem List   Diagnosis Date Noted   GERD (gastroesophageal reflux disease) 01/19/2021   Encounter for screening colonoscopy 01/19/2021   Fatty liver 01/19/2021   Encounter for screening fecal occult blood testing 12/29/2020   Encounter for gynecological examination with Papanicolaou smear of cervix 12/29/2020   Post-menopausal 12/29/2020   History of stroke 12/29/2020   Hypoglycemia    Hypertriglyceridemia 10/11/2016   Diarrhea due to malabsorption    Benign essential HTN    Transaminitis    Acute ischemic stroke (HCC)    Hyperglycemia    Chronic diastolic congestive heart failure (HCC)    Right hemiparesis (HCC)    Dysarthria, post-stroke    Poorly controlled diabetes mellitus ([place])    Dyslipidemia    Mixed hyperlipidemia    Basilar artery  stenosis    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[place]) 10/01/2016   DM (diabetes mellitus), type 2 with neurological complications ([place]) 16/03/9603   Hot flashes 07/30/2013   Hypertension 07/30/2013   Perimenopausal symptoms 07/30/2013   Larey Seat OT, MOT   Larey Seat, OT/L 03/25/2021, 12:49 PM  [place], [place], [address] Phone: [phone]   Fax:  [phone]  Name: AFRA TRICARICO MRN: [id number] Date of Birth: 02/13/1966

## 2021-03-25 NOTE — Patient Instructions (Signed)

## 2021-03-26 ENCOUNTER — Encounter (HOSPITAL_COMMUNITY): Payer: BC Managed Care – PPO

## 2021-03-31 DIAGNOSIS — S42201A Unspecified fracture of upper end of right humerus, initial encounter for closed fracture: Secondary | ICD-10-CM | POA: Insufficient documentation

## 2021-04-01 ENCOUNTER — Other Ambulatory Visit: Payer: Self-pay

## 2021-04-01 ENCOUNTER — Ambulatory Visit (HOSPITAL_COMMUNITY): Payer: BC Managed Care – PPO | Attending: Orthopedic Surgery

## 2021-04-01 ENCOUNTER — Encounter (HOSPITAL_COMMUNITY): Payer: Self-pay

## 2021-04-01 DIAGNOSIS — R29898 Other symptoms and signs involving the musculoskeletal system: Secondary | ICD-10-CM | POA: Diagnosis present

## 2021-04-01 DIAGNOSIS — M25511 Pain in right shoulder: Secondary | ICD-10-CM | POA: Diagnosis present

## 2021-04-01 DIAGNOSIS — M25611 Stiffness of right shoulder, not elsewhere classified: Secondary | ICD-10-CM | POA: Diagnosis present

## 2021-04-01 NOTE — Therapy (Signed)
[place], [place], [address] Phone: [phone]   Fax:  [phone]  Occupational Therapy Treatment  Patient Details  Name: Shelley Frazier MRN: [id number] Date of Birth: January 15, 1966 Referring Provider (OT): Arther Abbott, MD   AROM  Overall AROM Comments Assessed seated, er/IR adducted   AROM Assessment Site Shoulder   Right/Left Shoulder Right   Right Shoulder Flexion 125 Degrees   previous: 111  Right Shoulder ABduction 130 Degrees   previous: 129  Right Shoulder Internal Rotation 90 Degrees   previous: same  Right Shoulder External Rotation 64 Degrees   previous: 54  PROM  Overall PROM Comments Assessed supine. IR/er adducted   PROM Assessment Site Shoulder   Right/Left Shoulder Right   Right Shoulder Flexion 137 Degrees   previous: 125  Right Shoulder ABduction 180 Degrees   previous: 130  Right Shoulder Internal Rotation 90 Degrees   previous:80  Right Shoulder External Rotation 60 Degrees   previous: 30  Strength  Overall Strength Comments Assessed seated, er/IR adducted   Strength Assessment Site Shoulder   Right/Left Shoulder Right   Right Shoulder Flexion 4-/5   previous: same  Right Shoulder ABduction 4+/5   previous: 3+/5  Right Shoulder Internal Rotation 5/5   previous: 4-/5  Right Shoulder External Rotation 4-/5   previous: 3-/5    Encounter Date: 04/01/2021   OT End of Session - 04/01/21 0938     Visit Number 13    Number of Visits 16    Date for OT Re-Evaluation 04/18/21    Authorization Type [place] Time Period no co insurance. No visit limit $25 copy no auth needed    OT Start Time 0900    OT Stop Time 0940    OT Time Calculation (min) 40 min    Activity Tolerance Patient tolerated treatment well    Behavior During Therapy [facility] for tasks assessed/performed             Past Medical History:  Diagnosis Date   Back pain    Diabetes mellitus  without complication ([place])    Fatty liver    Gallstones    High cholesterol    Hot flashes 07/30/2013   Humerus fracture    Hypertension    Obesity    Perimenopausal symptoms 07/30/2013   Stroke ([place]) 2018    Past Surgical History:  Procedure Laterality Date   COLONOSCOPY N/A 02/18/2021   Procedure: COLONOSCOPY;  Surgeon: Daneil Dolin, MD;  [facility];  Service: Endoscopy;  Laterality: N/A;  9:30am   POLYPECTOMY  02/18/2021   Procedure: POLYPECTOMY;  Surgeon: Daneil Dolin, MD;  [facility];  Service: Endoscopy;;   TONSILLECTOMY AND ADENOIDECTOMY     TUBAL LIGATION      There were no vitals filed for this visit.   Subjective Assessment - 04/01/21 0915     Subjective  S: It's more sore in the mornings.    Currently in Pain? Yes    Pain Score 2     Pain Location Shoulder    Pain Orientation Right    Pain Descriptors / Indicators Sore    Pain Type Acute pain    Pain Onset Yesterday    Pain Frequency Occasional    Aggravating Factors  sleeping    Pain Relieving Factors OTC pain medication    Effect of Pain on Daily Activities moderate effect  Multiple Pain Sites No                [facility] OT Assessment - 04/01/21 0916       Assessment   Medical Diagnosis right shoulder proximal humerus fracture      Precautions   Precautions Shoulder    Type of Shoulder Precautions Follow standard proximal humerus fracture protocol. Week 3-5 Begin AA/ROM if pain is diminished. Week 6-8 (10/3-10/17) A/ROM Week 7-8 shoulder stretches. Week 8-10 scapular strengthening with band, light weight strengthening.      AROM   Overall AROM Comments Assessed seated, er/IR adducted    AROM Assessment Site Shoulder    Right/Left Shoulder Right    Right Shoulder Flexion 125 Degrees   previous: 111   Right Shoulder ABduction 130 Degrees   previous: 129   Right Shoulder Internal Rotation 90 Degrees   previous: same   Right Shoulder External Rotation 64 Degrees    previous: 54     PROM   Overall PROM Comments Assessed supine. IR/er adducted    PROM Assessment Site Shoulder    Right/Left Shoulder Right    Right Shoulder Flexion 137 Degrees   previous: 125   Right Shoulder ABduction 180 Degrees   previous: 130   Right Shoulder Internal Rotation 90 Degrees   previous:80   Right Shoulder External Rotation 60 Degrees   previous: 30     Strength   Overall Strength Comments Assessed seated, er/IR adducted    Strength Assessment Site Shoulder    Right/Left Shoulder Right    Right Shoulder Flexion 4-/5   previous: same   Right Shoulder ABduction 4+/5   previous: 3+/5   Right Shoulder Internal Rotation 5/5   previous: 4-/5   Right Shoulder External Rotation 4-/5   previous: 3-/5                     OT Treatments/Exercises (OP) - 04/01/21 0001       Exercises   Exercises Shoulder      Shoulder Exercises: Supine   Protraction PROM;5 reps;AROM;10 reps    Horizontal ABduction PROM;5 reps;AROM;10 reps    External Rotation PROM;5 reps;AROM;10 reps    Internal Rotation PROM;5 reps;AROM;10 reps    Flexion PROM;5 reps   unable to complete A/ROM   ABduction PROM;5 reps      Shoulder Exercises: Standing   Protraction AROM;12 reps    External Rotation AROM;12 reps    Internal Rotation AROM;12 reps    Flexion AROM;12 reps    ABduction AROM;12 reps      Shoulder Exercises: ROM/Strengthening   UBE (Upper Arm Bike) Level 2 2' forward 2' reverse   pace: 4.0   Over Head Lace seated; pt started at top of chain and laced to the bottom before removing.                      OT Short Term Goals - 04/01/21 0949       OT SHORT TERM GOAL #1   Title Patient will be educated and independent with HEP to increase functional use of RUE as dominant with all tasks 75% of the time.     Time 6    Period Weeks    Target Date 03/25/21      OT SHORT TERM GOAL #2   Title Patient will increase her RUE A/ROM to [facility] in order to complete  functional reaching tasks at or above shoulder level.  Time 6    Period Weeks    Status On-going      OT SHORT TERM GOAL #3   Title Patient will increase her RUE shoulder strength to 4/5 in order to return to using her RUE to complete meal prep tasks and lift and move moderate weighted items with less difficulty.    Time 6    Period Weeks    Status On-going      OT SHORT TERM GOAL #4   Title Patient will report a decrease in pain level in her RUE of approximately 3/10 or less while completing work related tasks.    Time 6    Period Weeks      OT SHORT TERM GOAL #5   Title Patient will decrease her RUE fascial restrictions to min amount or less in order to increase the functional mobility needed to complete household tasks and dressing tasks.    Time 6    Period Weeks                      Plan - 04/01/21 5277     Clinical Impression Statement A: Measurements taken for follow up appoinment with Dr. Aline Brochure tomorrow. Pt is demonstrating improvement overall with A/ROM and strength. She continues to have a pulling/catching sensation with certain movements. Today it was during passive flexion supine. Was unable to complete supine active shoulder flexion although when standing she experienced less difficulty. Further focused on functional reaching and strengthening during session with VC for form and technique.    Body Structure / Function / Physical Skills ADL;UE functional use;Fascial restriction;Pain;ROM;Strength    Plan P: Follow up on MD appointment. Attempt therapy ball strengthening.    Consulted and Agree with Plan of Care Patient             Patient will benefit from skilled therapeutic intervention in order to improve the following deficits and impairments:   Body Structure / Function / Physical Skills: ADL, UE functional use, Fascial restriction, Pain, ROM, Strength       Visit Diagnosis: Stiffness of right shoulder, not elsewhere classified  Other  symptoms and signs involving the musculoskeletal system  Acute pain of right shoulder    Problem List Patient Active Problem List   Diagnosis Date Noted   Closed fracture of proximal end of right humerus, initial encounter 8//22/22 03/31/2021   GERD (gastroesophageal reflux disease) 01/19/2021   Encounter for screening colonoscopy 01/19/2021   Fatty liver 01/19/2021   Encounter for screening fecal occult blood testing 12/29/2020   Encounter for gynecological examination with Papanicolaou smear of cervix 12/29/2020   Post-menopausal 12/29/2020   History of stroke 12/29/2020   Hypoglycemia    Hypertriglyceridemia 10/11/2016   Diarrhea due to malabsorption    Benign essential HTN    Transaminitis    Acute ischemic stroke (HCC)    Hyperglycemia    Chronic diastolic congestive heart failure (HCC)    Right hemiparesis (HCC)    Dysarthria, post-stroke    Poorly controlled diabetes mellitus ([place])    Dyslipidemia    Mixed hyperlipidemia    Basilar artery stenosis    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[place]) 10/01/2016   DM (diabetes mellitus), type 2 with neurological complications ([place]) 82/42/3536   Hot flashes 07/30/2013   Hypertension 07/30/2013   Perimenopausal symptoms 07/30/2013    Ailene Ravel, OTR/L,CBIS  [phone]  04/01/2021, 9:50 AM  [place], [place], [address] Phone:  [phone]   Fax:  [phone]  Name: RAEONNA MILO MRN: [id number] Date of Birth: Feb 06, 1966

## 2021-04-02 ENCOUNTER — Ambulatory Visit (INDEPENDENT_AMBULATORY_CARE_PROVIDER_SITE_OTHER): Payer: BC Managed Care – PPO | Admitting: Orthopedic Surgery

## 2021-04-02 ENCOUNTER — Encounter: Payer: Self-pay | Admitting: Orthopedic Surgery

## 2021-04-02 ENCOUNTER — Ambulatory Visit: Payer: BC Managed Care – PPO

## 2021-04-02 DIAGNOSIS — S42201A Unspecified fracture of upper end of right humerus, initial encounter for closed fracture: Secondary | ICD-10-CM | POA: Diagnosis not present

## 2021-04-02 NOTE — Progress Notes (Signed)
Chief Complaint  Patient presents with   Shoulder Injury    Right shoulder fracture 01/19/21 improving    Proximal humerus fracture 2-1/2  Improving with some pain at night   PT notes indicate improvement in therapy with the following deficits listed below  Patient will benefit from skilled therapeutic intervention in order to improve the following deficits and impairments:   Body Structure / Function / Physical Skills: ADL, UE functional use, Fascial restriction, Pain, ROM, Strength  Based on our discussion and the patient's improvement we have decided to do home exercises and she does not need another follow-up visit

## 2021-04-06 ENCOUNTER — Encounter (HOSPITAL_COMMUNITY): Payer: Self-pay

## 2021-04-06 NOTE — Therapy (Signed)
[name], [place], [address] Phone: [phone]   Fax:  [phone]  Patient Details  Name: Shelley Frazier MRN: [id number] Date of Birth: Jan 03, 1966 Referring Provider:  No ref. provider found  Encounter Date: 04/06/2021 OCCUPATIONAL THERAPY DISCHARGE SUMMARY  Visits from Start of Care: 13  At follow up visit with MD, discharge was discussed and patient was interested in discharging from OT services to continue to work on ROM and strength with her HEP.   Current functional level related to goals / functional outcomes: AROM  Overall AROM Comments Assessed seated, er/IR adducted   AROM Assessment Site Shoulder   Right/Left Shoulder Right   Right Shoulder Flexion 125 Degrees   previous: 111  Right Shoulder ABduction 130 Degrees   previous: 129  Right Shoulder Internal Rotation 90 Degrees   previous: same  Right Shoulder External Rotation 64 Degrees   previous: 54  PROM  Overall PROM Comments Assessed supine. IR/er adducted   PROM Assessment Site Shoulder   Right/Left Shoulder Right   Right Shoulder Flexion 137 Degrees   previous: 125  Right Shoulder ABduction 180 Degrees   previous: 130  Right Shoulder Internal Rotation 90 Degrees   previous:80  Right Shoulder External Rotation 60 Degrees   previous: 30  Strength  Overall Strength Comments Assessed seated, er/IR adducted   Strength Assessment Site Shoulder   Right/Left Shoulder Right   Right Shoulder Flexion 4-/5   previous: same  Right Shoulder ABduction 4+/5   previous: 3+/5  Right Shoulder Internal Rotation 5/5   previous: 4-/5  Right Shoulder External Rotation 4-/5   previous: 3-/5     Remaining deficits: Decreased strength and ROM.    Education / Equipment: Shoulder HEP   Patient agrees to discharge. Patient goals were partially met. Patient is being discharged due to being pleased with the current functional level.Ailene Ravel,  OTR/L,CBIS  [phone]  04/06/2021, 11:27 AM  [place], [place], [address] Phone: [phone]   Fax:  [phone]

## 2021-04-07 ENCOUNTER — Encounter (HOSPITAL_COMMUNITY): Payer: BC Managed Care – PPO

## 2021-04-14 ENCOUNTER — Encounter (HOSPITAL_COMMUNITY): Payer: BC Managed Care – PPO

## 2021-04-15 ENCOUNTER — Other Ambulatory Visit (HOSPITAL_COMMUNITY): Payer: Self-pay | Admitting: Internal Medicine

## 2021-04-15 DIAGNOSIS — Z1231 Encounter for screening mammogram for malignant neoplasm of breast: Secondary | ICD-10-CM

## 2021-04-20 ENCOUNTER — Ambulatory Visit (HOSPITAL_COMMUNITY)
Admission: RE | Admit: 2021-04-20 | Discharge: 2021-04-20 | Disposition: A | Discharge status: Home / Self Care | Payer: BC Managed Care – PPO | Source: Ambulatory Visit | Attending: Internal Medicine | Admitting: Internal Medicine

## 2021-04-20 ENCOUNTER — Other Ambulatory Visit: Payer: Self-pay

## 2021-04-20 DIAGNOSIS — Z1231 Encounter for screening mammogram for malignant neoplasm of breast: Secondary | ICD-10-CM | POA: Insufficient documentation

## 2022-04-05 ENCOUNTER — Other Ambulatory Visit (HOSPITAL_COMMUNITY): Payer: Self-pay | Admitting: Internal Medicine

## 2022-04-05 DIAGNOSIS — Z1231 Encounter for screening mammogram for malignant neoplasm of breast: Secondary | ICD-10-CM

## 2022-04-23 ENCOUNTER — Encounter (HOSPITAL_COMMUNITY): Payer: Self-pay

## 2022-04-23 ENCOUNTER — Ambulatory Visit (HOSPITAL_COMMUNITY)
Admission: RE | Admit: 2022-04-23 | Discharge: 2022-04-23 | Disposition: A | Discharge status: Home / Self Care | Payer: BC Managed Care – PPO | Source: Ambulatory Visit | Attending: Internal Medicine | Admitting: Internal Medicine

## 2022-04-23 DIAGNOSIS — Z1231 Encounter for screening mammogram for malignant neoplasm of breast: Secondary | ICD-10-CM | POA: Diagnosis present

## 2022-04-28 ENCOUNTER — Other Ambulatory Visit (HOSPITAL_COMMUNITY): Payer: Self-pay | Admitting: Internal Medicine

## 2022-04-28 DIAGNOSIS — R928 Other abnormal and inconclusive findings on diagnostic imaging of breast: Secondary | ICD-10-CM

## 2022-05-13 ENCOUNTER — Encounter (HOSPITAL_COMMUNITY): Payer: BC Managed Care – PPO

## 2022-05-13 ENCOUNTER — Ambulatory Visit (HOSPITAL_COMMUNITY): Payer: BC Managed Care – PPO

## 2022-06-08 ENCOUNTER — Ambulatory Visit (HOSPITAL_COMMUNITY)
Admission: RE | Admit: 2022-06-08 | Discharge: 2022-06-08 | Disposition: A | Discharge status: Home / Self Care | Payer: BC Managed Care – PPO | Source: Ambulatory Visit | Attending: Internal Medicine | Admitting: Internal Medicine

## 2022-06-08 ENCOUNTER — Encounter (HOSPITAL_COMMUNITY): Payer: Self-pay

## 2022-06-08 DIAGNOSIS — R928 Other abnormal and inconclusive findings on diagnostic imaging of breast: Secondary | ICD-10-CM

## 2022-07-29 ENCOUNTER — Encounter: Payer: Self-pay | Admitting: Radiology

## 2023-04-25 ENCOUNTER — Other Ambulatory Visit (HOSPITAL_COMMUNITY): Payer: Self-pay | Admitting: Internal Medicine

## 2023-04-25 DIAGNOSIS — Z1231 Encounter for screening mammogram for malignant neoplasm of breast: Secondary | ICD-10-CM

## 2023-05-02 ENCOUNTER — Ambulatory Visit (HOSPITAL_COMMUNITY)
Admission: RE | Admit: 2023-05-02 | Discharge: 2023-05-02 | Disposition: A | Discharge status: Home / Self Care | Payer: BC Managed Care – PPO | Source: Ambulatory Visit | Attending: Internal Medicine | Admitting: Internal Medicine

## 2023-05-02 DIAGNOSIS — Z1231 Encounter for screening mammogram for malignant neoplasm of breast: Secondary | ICD-10-CM | POA: Insufficient documentation

## 2023-11-01 ENCOUNTER — Other Ambulatory Visit (HOSPITAL_COMMUNITY)
Admission: RE | Admit: 2023-11-01 | Discharge: 2023-11-01 | Disposition: A | Discharge status: Home / Self Care | Source: Ambulatory Visit | Attending: Adult Health | Admitting: Adult Health

## 2023-11-01 ENCOUNTER — Encounter: Payer: Self-pay | Admitting: Adult Health

## 2023-11-01 ENCOUNTER — Ambulatory Visit: Payer: Self-pay | Admitting: Adult Health

## 2023-11-01 VITALS — BP 111/71 | HR 63 | Ht 62.0 in | Wt 156.0 lb

## 2023-11-01 DIAGNOSIS — Z01419 Encounter for gynecological examination (general) (routine) without abnormal findings: Secondary | ICD-10-CM | POA: Insufficient documentation

## 2023-11-01 DIAGNOSIS — Z8673 Personal history of transient ischemic attack (TIA), and cerebral infarction without residual deficits: Secondary | ICD-10-CM

## 2023-11-01 NOTE — Progress Notes (Signed)
 Patient ID: Shelley Frazier, female   DOB: [birth year], 58 y.o.   MRN: [id number] History of Present Illness: Shelley Frazier is a 58 year old white female, married, PM, in for well woman gyn exam and pap. She is doing well. Had stroke 2018.  PCP is Dr Del Favia   Current Medications, Allergies, Past Medical History, Past Surgical History, Family History and Social History were reviewed in [employer] electronic medical record.     Review of Systems: Patient denies any headaches, hearing loss, fatigue, blurred vision, slurred speech, shortness of breath, chest pain, abdominal pain, problems with bowel movements, urination, or intercourse. No joint pain or mood swings.  Denies any vaginal bleeding  Has decreased sex drive She has lost 30 lbs since 2022, on ozempic    Physical Exam:BP 111/71 (BP Location: Left Arm, Patient Position: Sitting, Cuff Size: Normal)   Pulse 63   Ht 5\' 2"  (1.575 m)   Wt 156 lb (70.8 kg)   LMP 04/18/2013   BMI 28.53 kg/m   General:  Well developed, well nourished, no acute distress Skin:  Warm and dry Neck:  Midline trachea, normal thyroid, good ROM, no lymphadenopathy Lungs; Clear to auscultation bilaterally Breast:  No dominant palpable mass, retraction, or nipple discharge Cardiovascular: Regular rate and rhythm Abdomen:  Soft, non tender, no hepatosplenomegaly Pelvic:  External genitalia is normal in appearance, no lesions.  The vagina is normal in appearance. Urethra has no lesions or masses. The cervix is smooth with stenotic os, pap with HR HPV genotyping performed.  Uterus is felt to be normal size, shape, and contour.  No adnexal masses or tenderness noted.Bladder is non tender, no masses felt. Rectal: Deferred  Extremities/musculoskeletal:  No swelling or varicosities noted, no clubbing or cyanosis Psych:  No mood changes, alert and cooperative,seems happy AA is 1 Fall risk is low    11/01/2023    2:31 PM 12/29/2020    1:38 PM 07/28/2016    8:48 AM   Depression screen PHQ 2/9  Decreased Interest 0 0 0  Down, Depressed, Hopeless 0 0 0  PHQ - 2 Score 0 0 0  Altered sleeping 0 0   Tired, decreased energy 2 0   Change in appetite 1 0   Feeling bad or failure about yourself  0 0   Trouble concentrating 0 0   Moving slowly or fidgety/restless 0 0   Suicidal thoughts 0 0   PHQ-9 Score 3 0        11/01/2023    2:31 PM 12/29/2020    1:38 PM  GAD 7 : Generalized Anxiety Score  Nervous, Anxious, on Edge 0 0  Control/stop worrying 0 0  Worry too much - different things 0 0  Trouble relaxing 0 0  Restless 0 0  Easily annoyed or irritable 0 0  Afraid - awful might happen 0 0  Total GAD 7 Score 0 0      Upstream - 11/01/23 1428       Pregnancy Intention Screening   Does the patient want to become pregnant in the next year? No    Does the patient's partner want to become pregnant in the next year? No    Would the patient like to discuss contraceptive options today? No      Contraception Wrap Up   Current Method Female Sterilization   PM   End Method Female Sterilization   PM   Contraception Counseling Provided No  Examination chaperoned by Alphonso Aschoff LPN  Impression and plan: 1. Encounter for gynecological examination with Papanicolaou smear of cervix (Primary) Pap sent Pap in 3 years if normal Physical with PCP Labs with PCP Mammogram was negative 05/20/23 Colonoscopy per GI   - Cytology - PAP( [place])  2. History of stroke Doing well

## 2023-11-08 LAB — CYTOLOGY - PAP
Comment: NEGATIVE
Diagnosis: NEGATIVE
High risk HPV: NEGATIVE

## 2023-11-09 ENCOUNTER — Ambulatory Visit: Payer: Self-pay | Admitting: Adult Health

## 2024-05-01 ENCOUNTER — Other Ambulatory Visit (HOSPITAL_COMMUNITY): Payer: Self-pay | Admitting: Internal Medicine

## 2024-05-01 DIAGNOSIS — Z1231 Encounter for screening mammogram for malignant neoplasm of breast: Secondary | ICD-10-CM

## 2024-05-04 ENCOUNTER — Ambulatory Visit (HOSPITAL_COMMUNITY): Admission: RE | Admit: 2024-05-04 | Discharge: 2024-05-04 | Disposition: A | Source: Ambulatory Visit

## 2024-05-04 DIAGNOSIS — Z1231 Encounter for screening mammogram for malignant neoplasm of breast: Secondary | ICD-10-CM
